# Patient Record
Sex: Male | Born: 1965 | Race: Black or African American | Hispanic: No | Marital: Married | Smoking: Light tobacco smoker
Health system: Southern US, Community
[De-identification: ages and names within clinical notes are randomized; demographics above are authoritative.]

## PROBLEM LIST (undated history)

## (undated) DIAGNOSIS — I639 Cerebral infarction, unspecified: Secondary | ICD-10-CM

## (undated) DIAGNOSIS — S99929A Unspecified injury of unspecified foot, initial encounter: Secondary | ICD-10-CM

## (undated) DIAGNOSIS — E785 Hyperlipidemia, unspecified: Secondary | ICD-10-CM

## (undated) DIAGNOSIS — I10 Essential (primary) hypertension: Secondary | ICD-10-CM

## (undated) HISTORY — DX: Cerebral infarction, unspecified: I63.9

## (undated) HISTORY — DX: Hyperlipidemia, unspecified: E78.5

---
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
## 2000-09-19 ENCOUNTER — Emergency Department (HOSPITAL_COMMUNITY): Admission: EM | Admit: 2000-09-19 | Discharge: 2000-09-19 | Payer: Self-pay | Admitting: Emergency Medicine

## 2000-09-19 ENCOUNTER — Encounter: Payer: Self-pay | Admitting: Emergency Medicine

## 2000-10-03 ENCOUNTER — Emergency Department (HOSPITAL_COMMUNITY): Admission: EM | Admit: 2000-10-03 | Discharge: 2000-10-03 | Payer: Self-pay | Admitting: Emergency Medicine

## 2001-12-25 ENCOUNTER — Emergency Department (HOSPITAL_COMMUNITY): Admission: EM | Admit: 2001-12-25 | Discharge: 2001-12-25 | Payer: Self-pay

## 2006-07-26 ENCOUNTER — Emergency Department (HOSPITAL_COMMUNITY): Admission: EM | Admit: 2006-07-26 | Discharge: 2006-07-26 | Payer: Self-pay | Admitting: Emergency Medicine

## 2007-12-04 ENCOUNTER — Emergency Department (HOSPITAL_COMMUNITY): Admission: EM | Admit: 2007-12-04 | Discharge: 2007-12-04 | Payer: Self-pay | Admitting: Emergency Medicine

## 2009-08-26 ENCOUNTER — Emergency Department (HOSPITAL_COMMUNITY): Admission: EM | Admit: 2009-08-26 | Discharge: 2009-08-26 | Payer: Self-pay | Admitting: Emergency Medicine

## 2009-08-28 ENCOUNTER — Encounter: Admission: RE | Admit: 2009-08-28 | Discharge: 2009-08-28 | Payer: Self-pay | Admitting: Internal Medicine

## 2010-01-19 ENCOUNTER — Emergency Department (HOSPITAL_COMMUNITY): Admission: EM | Admit: 2010-01-19 | Discharge: 2010-01-19 | Payer: Self-pay | Admitting: Emergency Medicine

## 2010-06-08 ENCOUNTER — Emergency Department (HOSPITAL_COMMUNITY)
Admission: EM | Admit: 2010-06-08 | Discharge: 2010-06-08 | Payer: Self-pay | Source: Home / Self Care | Admitting: Emergency Medicine

## 2012-03-14 ENCOUNTER — Encounter (HOSPITAL_COMMUNITY): Payer: Self-pay | Admitting: Emergency Medicine

## 2012-03-14 ENCOUNTER — Emergency Department (INDEPENDENT_AMBULATORY_CARE_PROVIDER_SITE_OTHER)
Admission: EM | Admit: 2012-03-14 | Discharge: 2012-03-14 | Disposition: A | Discharge status: Home / Self Care | Payer: 59 | Source: Home / Self Care | Attending: Family Medicine | Admitting: Family Medicine

## 2012-03-14 DIAGNOSIS — R0789 Other chest pain: Secondary | ICD-10-CM

## 2012-03-14 DIAGNOSIS — I10 Essential (primary) hypertension: Secondary | ICD-10-CM

## 2012-03-14 HISTORY — DX: Essential (primary) hypertension: I10

## 2012-03-14 MED ORDER — LISINOPRIL-HYDROCHLOROTHIAZIDE 20-12.5 MG PO TABS: 1.0000 | ORAL_TABLET | Freq: Every day | ORAL | Status: DC

## 2012-03-14 NOTE — ED Notes (Signed)
Chest pain started last night around 8:00 pm.  Patient was having a stressful conversation.  Touches center of chest as location of pain.  Describes pain as knife like.  Pain is intermittent.  Currently no chest pain.  Denies nausea, no vomiting, denies diaphoresis.  Denies sob.  Patient has been off bp med for 2 months, ran out of medicine

## 2012-03-14 NOTE — ED Provider Notes (Signed)
History     CSN: [id number]  Arrival date & time 03/14/12  1249   First MD Initiated Contact with Patient 03/14/12 1305      Chief Complaint  Patient presents with  . Chest Pain    (Consider location/radiation/quality/duration/timing/severity/associated sxs/prior treatment) Patient is a 46 y.o. male presenting with chest pain. The history is provided by the patient.  Chest Pain The chest pain began yesterday. Chest pain occurs intermittently. The chest pain is resolved. The pain is associated with lifting. The severity of the pain is mild. The quality of the pain is described as sharp. The pain does not radiate. Chest pain is worsened by certain positions. Pertinent negatives for primary symptoms include no fever, no shortness of breath, no nausea and no vomiting.  Pertinent negatives for associated symptoms include no diaphoresis.     Past Medical History  Diagnosis Date  . Hypertension     History reviewed. No pertinent past surgical history.  No family history on file.  History  Substance Use Topics  . Smoking status: Current Every Day Smoker  . Smokeless tobacco: Not on file  . Alcohol Use: Yes      Review of Systems  Constitutional: Negative.  Negative for fever and diaphoresis.  HENT: Negative.   Respiratory: Negative for chest tightness and shortness of breath.   Cardiovascular: Positive for chest pain.  Gastrointestinal: Negative.  Negative for nausea and vomiting.    Allergies  Review of patient's allergies indicates no known allergies.  Home Medications   Current Outpatient Rx  Name Route Sig Dispense Refill  . UNKNOWN TO PATIENT      . LISINOPRIL-HYDROCHLOROTHIAZIDE 20-12.5 MG PO TABS Oral Take 1 tablet by mouth daily. For blood pressure 30 tablet 1    BP 204/122  Pulse 72  Temp 98.6 F (37 C) (Oral)  Resp 20  SpO2 100%  Physical Exam  Nursing note and vitals reviewed. Constitutional: He is oriented to person, place, and time. He appears  well-developed and well-nourished.  HENT:  Head: Normocephalic.  Eyes: Pupils are equal, round, and reactive to light.  Neck: Normal range of motion. Neck supple.  Cardiovascular: Normal rate, regular rhythm, normal heart sounds and intact distal pulses.   Pulmonary/Chest: Breath sounds normal. No respiratory distress. He has no wheezes. He has no rales. He exhibits tenderness.  Abdominal: Bowel sounds are normal.  Neurological: He is alert and oriented to person, place, and time.  Skin: Skin is warm and dry.    ED Course  Procedures (including critical care time)  Labs Reviewed - No data to display No results found.   1. Musculoskeletal chest pain   2. Hypertension       MDM  [id number]--[id number], no acute change.        Linna Hoff, MD 03/14/12 1335

## 2012-11-16 ENCOUNTER — Encounter (HOSPITAL_COMMUNITY): Payer: Self-pay | Admitting: *Deleted

## 2012-11-16 ENCOUNTER — Emergency Department (INDEPENDENT_AMBULATORY_CARE_PROVIDER_SITE_OTHER)
Admission: EM | Admit: 2012-11-16 | Discharge: 2012-11-16 | Disposition: A | Discharge status: Home / Self Care | Payer: Self-pay | Source: Home / Self Care | Attending: Emergency Medicine | Admitting: Emergency Medicine

## 2012-11-16 DIAGNOSIS — M5430 Sciatica, unspecified side: Secondary | ICD-10-CM

## 2012-11-16 DIAGNOSIS — I10 Essential (primary) hypertension: Secondary | ICD-10-CM

## 2012-11-16 DIAGNOSIS — M5431 Sciatica, right side: Secondary | ICD-10-CM

## 2012-11-16 MED ORDER — HYDROCODONE-ACETAMINOPHEN 5-325 MG PO TABS
ORAL_TABLET | ORAL | Status: AC
  Filled 2012-11-16: qty 2

## 2012-11-16 MED ORDER — HYDROCODONE-ACETAMINOPHEN 5-325 MG PO TABS: ORAL_TABLET | ORAL | Status: DC

## 2012-11-16 MED ORDER — METHYLPREDNISOLONE ACETATE 80 MG/ML IJ SUSP
80.0000 mg | Freq: Once | INTRAMUSCULAR | Status: AC
  Administered 2012-11-16: 80 mg via INTRAMUSCULAR

## 2012-11-16 MED ORDER — PREDNISONE 10 MG PO TABS: ORAL_TABLET | ORAL | Status: DC

## 2012-11-16 MED ORDER — AMLODIPINE BESYLATE 10 MG PO TABS: 10.0000 mg | ORAL_TABLET | Freq: Every day | ORAL | Status: DC

## 2012-11-16 MED ORDER — HYDROCODONE-ACETAMINOPHEN 5-325 MG PO TABS
2.0000 | ORAL_TABLET | Freq: Once | ORAL | Status: AC
  Administered 2012-11-16: 2 via ORAL

## 2012-11-16 MED ORDER — LISINOPRIL-HYDROCHLOROTHIAZIDE 20-25 MG PO TABS: 1.0000 | ORAL_TABLET | Freq: Every day | ORAL | Status: DC

## 2012-11-16 MED ORDER — METHOCARBAMOL 500 MG PO TABS: 500.0000 mg | ORAL_TABLET | Freq: Three times a day (TID) | ORAL | Status: DC

## 2012-11-16 MED ORDER — METHYLPREDNISOLONE ACETATE 80 MG/ML IJ SUSP
INTRAMUSCULAR | Status: AC
  Filled 2012-11-16: qty 1

## 2012-11-16 NOTE — ED Provider Notes (Signed)
Chief Complaint:   Chief Complaint  Patient presents with  . Back Pain    History of Present Illness:   Jamarquis Crull is a 47 year old male who has had a three-day history of pain in his right SI area with radiation to the thigh. This is described as a throbbing pain rated 8/10 in intensity. He denies any injury to the area. The pain began while he was walking up some steps. He's had the same thing in the past and been diagnosed with sciatica. The pain is worse if he lies on his right side and better with Advil. The leg feels somewhat weak but there's been no numbness or tingling. He denies any dysuria, frequency, urgency, or blood in the urine. He's had no incontinence of urine or stool. No abdominal pain, fever, chills, headache, stiff neck, or unintentional weight loss.  Review of Systems:  Other than noted above, the patient denies any of the following symptoms: Systemic:  No fever, chills, severe fatigue, or unexplained weight loss. GI:  No abdominal pain, nausea, vomiting, diarrhea, constipation, incontinence of bowel, or blood in stool. GU:  No dysuria, frequency, urgency, or hematuria. No incontinence of urine or difficulty urinating.  M-S:  No neck pain, joint pain, arthritis, or myalgias. Neuro:  No paresthesias, saddle anesthesia, muscular weakness, or progressive neurological deficit.  PMFSH:  Past medical history, family history, social history, meds, and allergies were reviewed. Specifically, there is no history of cancer, major trauma, osteoporosis, immunosuppression, or HIV infection.   Physical Exam:   Vital signs:  BP 170/112  Pulse 72  Temp(Src) 98.6 F (37 C) (Oral)  Resp 18  SpO2 100% General:  Alert, oriented, in no distress. Abdomen:  Soft, non-tender.  No organomegaly or mass.  No pulsatile midline abdominal mass or bruit. Back:  There was tenderness to palpation in the lower lumbar spine and over the right SI area. His back had a fairly good range of motion with pain  at the endpoints of movement. Straight leg raising was positive on the right with a positive Lasegue's sign and negative popliteal compression sign. Neuro:  Normal muscle strength, sensations and DTRs. Extremities: Pedal pulses were full, there was no edema. Skin:  Clear, warm and dry.  No rash.  Course in Urgent Care Center:   Given Depo-Medrol 80 mg IM and Norco 5/325 2 tablets by mouth for pain.  Assessment:  The primary encounter diagnosis was Sciatica, right. A diagnosis of Hypertension was also pertinent to this visit.  Suggested followup with orthopedics if no improvement within the next 2 weeks. He'll also need followup with her primary care Dr. and he was given several names. I increased his blood pressure regimen to lisinopril/HCTZ 20/25 one per day and amlodipine 10 mg per day. He has enough medication to last for 3 months in order to give him enough time to find a primary care Dr.  Roosvelt Harps:   1.  The following meds were prescribed:   Discharge Medication List as of 11/16/2012 11:44 AM    START taking these medications   Details  amLODipine (NORVASC) 10 MG tablet Take 1 tablet (10 mg total) by mouth daily., Starting 11/16/2012, Until Discontinued, Normal    HYDROcodone-acetaminophen (NORCO/VICODIN) 5-325 MG per tablet 1 to 2 tabs every 4 to 6 hours as needed for pain., Print    lisinopril-hydrochlorothiazide (PRINZIDE,ZESTORETIC) 20-25 MG per tablet Take 1 tablet by mouth daily., Starting 11/16/2012, Until Discontinued, Normal    methocarbamol (ROBAXIN) 500 MG tablet Take  1 tablet (500 mg total) by mouth 3 (three) times daily., Starting 11/16/2012, Until Discontinued, Normal    predniSONE (DELTASONE) 10 MG tablet Take 4 tabs daily for 4 days, 3 tabs daily for 4 days, 2 tabs daily for 4 days, then 1 tab daily for 4 days., Normal       2.  The patient was instructed in symptomatic care and handouts were given. 3.  The patient was told to return if becoming worse in any way, if no better  in 2 weeks, and given some red flag symptoms including new or changing neurological symptoms that would indicate earlier return. 4.  The patient was encouraged to try to be as active as possible and given some exercises to do followed by moist heat. 5.  Follow up with Dr. Victorino Dike if back pain does not improve within the next 2 weeks and with a primary care doctor for the blood pressure.    Reuben Likes, MD 11/16/12 [phone]

## 2012-11-16 NOTE — ED Notes (Signed)
Pt  Reports  Pain low  Back  And  r  Hip       X  2  Days      This  Time  He  Reports  A  History  Of  Similar  Symptoms  And  He  States        He  Was  Told         He  May have  A  Pinched  Nerve      - his  bpis  High   He  States  He  Took  His  meds  Today     He   Ambulated  To  Room with a  Slow  Steady  gait

## 2015-02-10 ENCOUNTER — Encounter (HOSPITAL_COMMUNITY): Payer: Self-pay | Admitting: Emergency Medicine

## 2015-02-10 ENCOUNTER — Emergency Department (HOSPITAL_COMMUNITY)
Admission: EM | Admit: 2015-02-10 | Discharge: 2015-02-10 | Disposition: A | Discharge status: Nursing Home (Includes ICF) | Payer: PRIVATE HEALTH INSURANCE | Source: Home / Self Care

## 2015-02-10 ENCOUNTER — Emergency Department (HOSPITAL_COMMUNITY)
Admission: EM | Admit: 2015-02-10 | Discharge: 2015-02-10 | Disposition: A | Discharge status: Home / Self Care | Payer: PRIVATE HEALTH INSURANCE | Attending: Emergency Medicine | Admitting: Emergency Medicine

## 2015-02-10 ENCOUNTER — Other Ambulatory Visit (HOSPITAL_COMMUNITY)
Admission: RE | Admit: 2015-02-10 | Discharge: 2015-02-10 | Disposition: A | Discharge status: Home / Self Care | Payer: PRIVATE HEALTH INSURANCE | Source: Ambulatory Visit | Attending: Family Medicine | Admitting: Family Medicine

## 2015-02-10 DIAGNOSIS — Z202 Contact with and (suspected) exposure to infections with a predominantly sexual mode of transmission: Secondary | ICD-10-CM | POA: Diagnosis not present

## 2015-02-10 DIAGNOSIS — Z720 Tobacco use: Secondary | ICD-10-CM | POA: Diagnosis not present

## 2015-02-10 DIAGNOSIS — Z79899 Other long term (current) drug therapy: Secondary | ICD-10-CM | POA: Insufficient documentation

## 2015-02-10 DIAGNOSIS — I10 Essential (primary) hypertension: Secondary | ICD-10-CM | POA: Insufficient documentation

## 2015-02-10 DIAGNOSIS — I160 Hypertensive urgency: Secondary | ICD-10-CM

## 2015-02-10 DIAGNOSIS — I161 Hypertensive emergency: Secondary | ICD-10-CM

## 2015-02-10 LAB — POCT URINALYSIS DIP (DEVICE)
Bilirubin Urine: NEGATIVE
GLUCOSE, UA: NEGATIVE mg/dL
Ketones, ur: NEGATIVE mg/dL
LEUKOCYTES UA: NEGATIVE
NITRITE: NEGATIVE
PROTEIN: NEGATIVE mg/dL
SPECIFIC GRAVITY, URINE: 1.01 (ref 1.005–1.030)
UROBILINOGEN UA: 1 mg/dL (ref 0.0–1.0)
pH: 6 (ref 5.0–8.0)

## 2015-02-10 LAB — I-STAT CHEM 8, ED
BUN: 9 mg/dL (ref 6–20)
CREATININE: 1.1 mg/dL (ref 0.61–1.24)
Calcium, Ion: 1.1 mmol/L — ABNORMAL LOW (ref 1.12–1.23)
Chloride: 103 mmol/L (ref 101–111)
GLUCOSE: 87 mg/dL (ref 65–99)
HEMATOCRIT: 50 % (ref 39.0–52.0)
HEMOGLOBIN: 17 g/dL (ref 13.0–17.0)
POTASSIUM: 3.7 mmol/L (ref 3.5–5.1)
Sodium: 140 mmol/L (ref 135–145)
TCO2: 22 mmol/L (ref 0–100)

## 2015-02-10 MED ORDER — HYDROCHLOROTHIAZIDE 12.5 MG PO CAPS
12.5000 mg | ORAL_CAPSULE | Freq: Every day | ORAL | Status: DC
  Administered 2015-02-10: 12.5 mg via ORAL
  Filled 2015-02-10: qty 1

## 2015-02-10 MED ORDER — CLONIDINE HCL 0.1 MG PO TABS
ORAL_TABLET | ORAL | Status: AC
  Filled 2015-02-10: qty 2

## 2015-02-10 MED ORDER — HYDROCHLOROTHIAZIDE 12.5 MG PO CAPS: 12.5000 mg | ORAL_CAPSULE | Freq: Every day | ORAL | Status: DC

## 2015-02-10 MED ORDER — LISINOPRIL 20 MG PO TABS
20.0000 mg | ORAL_TABLET | Freq: Once | ORAL | Status: AC
  Administered 2015-02-10: 20 mg via ORAL
  Filled 2015-02-10: qty 1

## 2015-02-10 MED ORDER — CLONIDINE HCL 0.1 MG PO TABS
0.2000 mg | ORAL_TABLET | Freq: Once | ORAL | Status: AC
Start: 2015-02-10 — End: 2015-02-10
  Administered 2015-02-10: 0.2 mg via ORAL

## 2015-02-10 MED ORDER — LISINOPRIL 20 MG PO TABS: 20.0000 mg | ORAL_TABLET | Freq: Once | ORAL | Status: DC

## 2015-02-10 MED ORDER — AMLODIPINE BESYLATE 10 MG PO TABS: 10.0000 mg | ORAL_TABLET | Freq: Every day | ORAL | Status: DC

## 2015-02-10 MED ORDER — AMLODIPINE BESYLATE 5 MG PO TABS
10.0000 mg | ORAL_TABLET | Freq: Every day | ORAL | Status: DC
  Administered 2015-02-10: 10 mg via ORAL
  Filled 2015-02-10: qty 2

## 2015-02-10 MED ORDER — AZITHROMYCIN 250 MG PO TABS
1000.0000 mg | ORAL_TABLET | Freq: Once | ORAL | Status: AC
  Administered 2015-02-10: 1000 mg via ORAL

## 2015-02-10 MED ORDER — AZITHROMYCIN 250 MG PO TABS
ORAL_TABLET | ORAL | Status: AC
  Filled 2015-02-10: qty 4

## 2015-02-10 NOTE — ED Provider Notes (Signed)
CSN: [id number]     Arrival date & time 02/10/15  1913 History   None    Chief Complaint  Patient presents with  . Hip Pain  . Exposure to STD  . Hypertension   (Consider location/radiation/quality/duration/timing/severity/associated sxs/prior Treatment) HPI  Patient is here today complaining about having high blood pressure. He states that he doesn't really check it but knows it is very high. Patient will not donate further details as to how he knows it is high. Last refill on his blood pressure medications was in 2014. Patient states he still has a couple of pills left over from that original prescription which was a 30 day prescription. Patient denies any active headache, chest pain, shortness of breath, palpitations. Patient did have a brother who died of a stroke at age 61. Patient states he took his last blood pressure location a couple days ago was unsure which one it was. Patient with long-standing history of hypertension is uncontrolled. Last vital sign check 2014 shows a blood pressure 170/112. Patient also states that he is sexually active with his wife without condoms to was recently diagnosed with chlamydia. Patient denies any penile discharge, or developing, fevers, nausea, vomiting, back pain.     Past Medical History  Diagnosis Date  . Hypertension    History reviewed. No pertinent past surgical history. Family History  Problem Relation Age of Onset  . Heart attack Mother   . Stroke Brother 52  . Stroke Other    Social History  Substance Use Topics  . Smoking status: Current Every Day Smoker -- 0.50 packs/day    Types: Cigarettes  . Smokeless tobacco: None  . Alcohol Use: Yes    Review of Systems Per HPI with all other pertinent systems negative.   Allergies  Review of patient's allergies indicates no known allergies.  Home Medications   Prior to Admission medications   Medication Sig Start Date End Date Taking? Authorizing Provider  amLODipine (NORVASC)  10 MG tablet Take 1 tablet (10 mg total) by mouth daily. 11/16/12  Yes Reuben Likes, MD  lisinopril-hydrochlorothiazide (PRINZIDE,ZESTORETIC) 20-12.5 MG per tablet Take 1 tablet by mouth daily. For blood pressure 03/14/12  Yes Linna Hoff, MD  lisinopril-hydrochlorothiazide (PRINZIDE,ZESTORETIC) 20-25 MG per tablet Take 1 tablet by mouth daily. 11/16/12   Reuben Likes, MD  methocarbamol (ROBAXIN) 500 MG tablet Take 1 tablet (500 mg total) by mouth 3 (three) times daily. 11/16/12   Reuben Likes, MD  UNKNOWN TO PATIENT     Historical Provider, MD   Meds Ordered and Administered this Visit   Medications  azithromycin (ZITHROMAX) tablet 1,000 mg (not administered)  cloNIDine (CATAPRES) tablet 0.2 mg (not administered)    BP 226/130 mmHg  Pulse 84  Temp(Src) 98.1 F (36.7 C) (Oral)  SpO2 100% No data found.   Physical Exam Physical Exam  Constitutional: oriented to person, place, and time. appears well-developed and well-nourished. No distress.  HENT:  Head: Normocephalic and atraumatic.  Eyes: EOMI. PERRL.  Neck: Normal range of motion.  Cardiovascular: RRR, no m/r/g, 2+ distal pulses,  Pulmonary/Chest: Effort normal and breath sounds normal. No respiratory distress.  Abdominal: Soft. Bowel sounds are normal. NonTTP, no distension.  Musculoskeletal: Normal range of motion. Non ttp, no effusion.  Neurological: alert and oriented to person, place, and time.  Skin: Skin is warm. No rash noted. non diaphoretic.  Psychiatric: normal mood and affect. behavior is normal. Judgment and thought content normal.   ED Course  Procedures (including critical care time)  Labs Review Labs Reviewed  POCT URINALYSIS DIP (DEVICE) - Abnormal; Notable for the following:    Hgb urine dipstick TRACE (*)    All other components within normal limits  HIV ANTIBODY (ROUTINE TESTING)  RPR  URINE CYTOLOGY ANCILLARY ONLY    Imaging Review No results found.   Visual Acuity Review  Right Eye  Distance:   Left Eye Distance:   Bilateral Distance:    Right Eye Near:   Left Eye Near:    Bilateral Near:         MDM   1. Hypertensive emergency   2. STD exposure    Patient presenting in a hypertensive emergency state. Blood pressure on my read with manual cuff was 240/140. Of note patient is here with his wife and is reluctant to fully state all of his symptoms. Patient fact even ask his wife to leave the room. 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 Patient's last blood pressure medication refill was a 30 day supply back in July 2014 at which time his blood pressure was 170/112. Patient has a brother who died of a stroke at age 40. Patient was given clonidine 0.2 mg and transported to the emergency room for further management. Of note patient also complaining of STD exposures his wife had chlamydia.  Azithromycin 1000 g by mouth administered in clinic. STD panel sent. HIV, RPR.    Ozella Rocks, MD 02/10/15 2117

## 2015-02-10 NOTE — ED Notes (Signed)
The patient went to [facility] to be treated for an exposure to an STD.  [facility] gacve him  of Zithromax and 0.2 of clonidine for his BP of 240/140.  [facility] transferred him here for the HTN.

## 2015-02-10 NOTE — Discharge Instructions (Signed)
Hypertension Hypertension, commonly called high blood pressure, is when the force of blood pumping through your arteries is too strong. Your arteries are the blood vessels that carry blood from your heart throughout your body. A blood pressure reading consists of a higher number over a lower number, such as 110/72. The higher number (systolic) is the pressure inside your arteries when your heart pumps. The lower number (diastolic) is the pressure inside your arteries when your heart relaxes. Ideally you want your blood pressure below 120/80. Hypertension forces your heart to work harder to pump blood. Your arteries may become narrow or stiff. Having hypertension puts you at risk for heart disease, stroke, and other problems.  RISK FACTORS Some risk factors for high blood pressure are controllable. Others are not.  Risk factors you cannot control include:   Race. You may be at higher risk if you are African American.  Age. Risk increases with age.  Gender. Men are at higher risk than women before age 14 years. After age 62, women are at higher risk than men. Risk factors you can control include:  Not getting enough exercise or physical activity.  Being overweight.  Getting too much fat, sugar, calories, or salt in your diet.  Drinking too much alcohol. SIGNS AND SYMPTOMS Hypertension does not usually cause signs or symptoms. Extremely high blood pressure (hypertensive crisis) may cause headache, anxiety, shortness of breath, and nosebleed. DIAGNOSIS  To check if you have hypertension, your health care provider will measure your blood pressure while you are seated, with your arm held at the level of your heart. It should be measured at least twice using the same arm. Certain conditions can cause a difference in blood pressure between your right and left arms. A blood pressure reading that is higher than normal on one occasion does not mean that you need treatment. If one blood pressure  reading is high, ask your health care provider about having it checked again. TREATMENT  Treating high blood pressure includes making lifestyle changes and possibly taking medicine. Living a healthy lifestyle can help lower high blood pressure. You may need to change some of your habits. Lifestyle changes may include:  Following the DASH diet. This diet is high in fruits, vegetables, and whole grains. It is low in salt, red meat, and added sugars.  Getting at least 2 hours of brisk physical activity every week.  Losing weight if necessary.  Not smoking.  Limiting alcoholic beverages.  Learning ways to reduce stress. If lifestyle changes are not enough to get your blood pressure under control, your health care provider may prescribe medicine. You may need to take more than one. Work closely with your health care provider to understand the risks and benefits. HOME CARE INSTRUCTIONS  Have your blood pressure rechecked as directed by your health care provider.   Take medicines only as directed by your health care provider. Follow the directions carefully. Blood pressure medicines must be taken as prescribed. The medicine does not work as well when you skip doses. Skipping doses also puts you at risk for problems.   Do not smoke.   Monitor your blood pressure at home as directed by your health care provider. SEEK MEDICAL CARE IF:   You think you are having a reaction to medicines taken.  You have recurrent headaches or feel dizzy.  You have swelling in your ankles.  You have trouble with your vision. SEEK IMMEDIATE MEDICAL CARE IF:  You develop a severe headache or confusion.  You have unusual weakness, numbness, or feel faint.  You have severe chest or abdominal pain.  You vomit repeatedly.  You have trouble breathing. MAKE SURE YOU:   Understand these instructions.  Will watch your condition.  Will get help right away if you are not doing well or get  worse. Document Released: 05/03/2005 Document Revised: 09/17/2013 Document Reviewed: 02/23/2013 [facility] Patient Information 2015 [place], [place]. This information is not intended to replace advice given to you by your health care provider. Make sure you discuss any questions you have with your health care provider.  Managing Your High Blood Pressure Blood pressure is a measurement of how forceful your blood is pressing against the walls of the arteries. Arteries are muscular tubes within the circulatory system. Blood pressure does not stay the same. Blood pressure rises when you are active, excited, or nervous; and it lowers during sleep and relaxation. If the numbers measuring your blood pressure stay above normal most of the time, you are at risk for health problems. High blood pressure (hypertension) is a long-term (chronic) condition in which blood pressure is elevated. A blood pressure reading is recorded as two numbers, such as 120 over 80 (or 120/80). The first, higher number is called the systolic pressure. It is a measure of the pressure in your arteries as the heart beats. The second, lower number is called the diastolic pressure. It is a measure of the pressure in your arteries as the heart relaxes between beats.  Keeping your blood pressure in a normal range is important to your overall health and prevention of health problems, such as heart disease and stroke. When your blood pressure is uncontrolled, your heart has to work harder than normal. High blood pressure is a very common condition in adults because blood pressure tends to rise with age. Men and women are equally likely to have hypertension but at different times in life. Before age 24, men are more likely to have hypertension. After 48 years of age, women are more likely to have it. Hypertension is especially common in African Americans. This condition often has no signs or symptoms. The cause of the condition is usually not known. Your  caregiver can help you come up with a plan to keep your blood pressure in a normal, healthy range. BLOOD PRESSURE STAGES Blood pressure is classified into four stages: normal, prehypertension, stage 1, and stage 2. Your blood pressure reading will be used to determine what type of treatment, if any, is necessary. Appropriate treatment options are tied to these four stages:  Normal  Systolic pressure (mm Hg): below 120.  Diastolic pressure (mm Hg): below 80. Prehypertension  Systolic pressure (mm Hg): 120 to 139.  Diastolic pressure (mm Hg): 80 to 89. Stage1  Systolic pressure (mm Hg): 140 to 159.  Diastolic pressure (mm Hg): 90 to 99. Stage2  Systolic pressure (mm Hg): 160 or above.  Diastolic pressure (mm Hg): 100 or above. RISKS RELATED TO HIGH BLOOD PRESSURE Managing your blood pressure is an important responsibility. Uncontrolled high blood pressure can lead to:  A heart attack.  A stroke.  A weakened blood vessel (aneurysm).  Heart failure.  Kidney damage.  Eye damage.  Metabolic syndrome.  Memory and concentration problems. HOW TO MANAGE YOUR BLOOD PRESSURE Blood pressure can be managed effectively with lifestyle changes and medicines (if needed). Your caregiver will help you come up with a plan to bring your blood pressure within a normal range. Your plan should include the following: Education  Read all information provided by your caregivers about how to control blood pressure.  Educate yourself on the latest guidelines and treatment recommendations. New research is always being done to further define the risks and treatments for high blood pressure. Lifestylechanges  Control your weight.  Avoid smoking.  Stay physically active.  Reduce the amount of salt in your diet.  Reduce stress.  Control any chronic conditions, such as high cholesterol or diabetes.  Reduce your alcohol intake. Medicines  Several medicines (antihypertensive medicines)  are available, if needed, to bring blood pressure within a normal range. Communication  Review all the medicines you take with your caregiver because there may be side effects or interactions.  Talk with your caregiver about your diet, exercise habits, and other lifestyle factors that may be contributing to high blood pressure.  See your caregiver regularly. Your caregiver can help you create and adjust your plan for managing high blood pressure. RECOMMENDATIONS FOR TREATMENT AND FOLLOW-UP  The following recommendations are based on current guidelines for managing high blood pressure in nonpregnant adults. Use these recommendations to identify the proper follow-up period or treatment option based on your blood pressure reading. You can discuss these options with your caregiver.  Systolic pressure of 120 to 139 or diastolic pressure of 80 to 89: Follow up with your caregiver as directed.  Systolic pressure of 140 to 160 or diastolic pressure of 90 to 100: Follow up with your caregiver within 2 months.  Systolic pressure above 160 or diastolic pressure above 100: Follow up with your caregiver within 1 month.  Systolic pressure above 180 or diastolic pressure above 110: Consider antihypertensive therapy; follow up with your caregiver within 1 week.  Systolic pressure above 200 or diastolic pressure above 120: Begin antihypertensive therapy; follow up with your caregiver within 1 week. Document Released: 01/26/2012 Document Reviewed: 01/26/2012 [facility] Patient Information 2015 [place], [place]. This information is not intended to replace advice given to you by your health care provider. Make sure you discuss any questions you have with your health care provider.     Emergency Department Resource Guide 1) Find a Doctor and Pay Out of Pocket Although you won't have to find out who is covered by your insurance plan, it is a good idea to ask around and get recommendations. You will then need to  call the office and see if the doctor you have chosen will accept you as a new patient and what types of options they offer for patients who are self-pay. Some doctors offer discounts or will set up payment plans for their patients who do not have insurance, but you will need to ask so you aren't surprised when you get to your appointment.  2) Contact Your Local Health Department Not all health departments have doctors that can see patients for sick visits, but many do, so it is worth a call to see if yours does. If you don't know where your local health department is, you can check in your phone book. The CDC also has a tool to help you locate your state's health department, and many state websites also have listings of all of their local health departments.  3) Find a Walk-in Clinic If your illness is not likely to be very severe or complicated, you may want to try a walk in clinic. These are popping up all over the country in pharmacies, drugstores, and shopping centers. They're usually staffed by nurse practitioners or physician assistants that have been trained to treat common  illnesses and complaints. They're usually fairly quick and inexpensive. However, if you have serious medical issues or chronic medical problems, these are probably not your best option.  No Primary Care Doctor: - Call Health Connect at  [phone] - they can help you locate a primary care doctor that  accepts your insurance, provides certain services, etc. - Physician Referral Service- [phone]  Chronic Pain Problems: Organization         Address  Phone   Notes  [facility]  [phone] Patients need to be referred by their primary care doctor.   Medication Assistance: Organization         Address  Phone   Notes  [facility] [address] [place] [place], [place] [address] [phone] --Must be a resident of [facility] -- Must have NO insurance  coverage whatsoever (no Medicaid/ Medicare, etc.) -- The pt. MUST have a primary care doctor that directs their care regularly and follows them in the community   [facility]  [phone]   [employer]  [phone]    Agencies that provide inexpensive medical care: Organization         Address  Phone   Notes  [facility]  [phone]   [facility]    [phone]   [facility] [address] [place], [place] [address] [phone]   [facility] [address] [place] [phone]   [facility]    [phone]   [facility]    [phone]   [facility]  [address], [place] Phone:  [phone], Fax:  [phone] Hours of Operation:  9 am - 6 pm, M-F.  Also accepts Medicaid/Medicare and self-pay.  [facility]  [address], [place] Phone: [phone], Fax: [phone]. Hours of Operation:  8:30 am - 5:30 pm, M-F.  Also accepts Medicaid and self-pay.  [facility] [place] [address], [place] Phone: [phone]   [facility] [address] Natasha Bence [place], [place] [phone], Ext. [phone] Mondays & Thursdays: 7-9 AM.  First 15 patients are seen on a first come, first serve basis.    Medicaid-accepting [facility] Providers:  Organization         Address  Phone   Notes  [facility] [address], [place] [phone] Also accepts self-pay patients.  [facility] [address] Laurell Josephs [place], [place]  [phone]   [facility] [address], [place] [phone]   [facility] [address], [place] [phone]   Renaye Rakers [address], [place]   [phone] Only accepts [place] patients after they have their  name applied to their card.   Self-Pay (no insurance) in [facility]:  Organization         Address  Phone   Notes  Sickle Cell Patients, [facility] [address] [place], [place] [phone]   [facility] [address] [place], [place] [phone]   [facility] [place]  [address], [place] [phone]   [facility]/Dr. Osei-Bonsu  [address], [place] or [address], [address],  [place] [phone] Phone number for both [facility] and [place] locations is the same.  [facility] [address], [place] [phone]   [facility] [address] [place] or [address] [phone]   [facility] [address] [place] [phone], phone; [phone], fax Sees patients 1st and 3rd Saturday of every month.  Must not qualify for public or private insurance (i.e. Medicaid, Medicare, [place] Health Choice, Veterans' Benefits)  Household income should be no more than 200% of the poverty level The clinic cannot treat you if you are pregnant or think you are pregnant  Sexually transmitted diseases are not treated at the clinic.

## 2015-02-10 NOTE — ED Notes (Signed)
MD at bedside. 

## 2015-02-10 NOTE — ED Notes (Signed)
Pt is here today for chronic issue with his right hip.  Pt has been seen here and by other doctors for his hip.  He has not followed up with an orthopedist.  He is also here for STD check.  His wife was diagnosed with chlamydia three weeks ago.  While being assessed the pt is noted to have extremely high BP.  Dr. Konrad Dolores is aware.

## 2015-02-10 NOTE — ED Notes (Signed)
Pt is being transferred to the ED via shuttle for elevated BP.  Report was given to [facility], RN in the ED.

## 2015-02-10 NOTE — ED Provider Notes (Signed)
CSN: [id number]     Arrival date & time 02/10/15  2126 History   First MD Initiated Contact with Patient 02/10/15 2221     Chief Complaint  Patient presents with  . Hypertension    The patient went to [facility] to be treated for an exposure to an STD.  [facility] gacve him  of Zithromax and 0.2 of clonidine for his BP of 240/140.  [facility] transferred him here for the HTN.   Patient is a 49 y.o. male presenting with hypertension.  Hypertension This is a recurrent problem. The current episode started today. The problem has been unchanged. Pertinent negatives include no abdominal pain, anorexia, arthralgias, change in bowel habit, chest pain, chills, congestion, diaphoresis, fatigue, fever, headaches, joint swelling, myalgias, nausea, neck pain, numbness, rash, sore throat, swollen glands, urinary symptoms, vertigo, visual change, vomiting or weakness. Nothing aggravates the symptoms.    Past Medical History  Diagnosis Date  . Hypertension    History reviewed. No pertinent past surgical history. Family History  Problem Relation Age of Onset  . Heart attack Mother   . Stroke Brother 62  . Stroke Other    Social History  Substance Use Topics  . Smoking status: Current Every Day Smoker -- 0.50 packs/day    Types: Cigarettes  . Smokeless tobacco: None  . Alcohol Use: Yes    Review of Systems  Constitutional: Negative for fever, chills, diaphoresis and fatigue.  HENT: Negative for congestion and sore throat.   Cardiovascular: Negative for chest pain.  Gastrointestinal: Negative for nausea, vomiting, abdominal pain, anorexia and change in bowel habit.  Genitourinary: Negative for dysuria, hematuria and difficulty urinating.  Musculoskeletal: Negative for myalgias, joint swelling, arthralgias and neck pain.  Skin: Negative for rash.  Neurological: Negative for vertigo, weakness, numbness and headaches.  Psychiatric/Behavioral: Negative for confusion and agitation.  All other systems reviewed and  are negative.  Allergies  Review of patient's allergies indicates no known allergies.  Home Medications   Prior to Admission medications   Medication Sig Start Date End Date Taking? Authorizing Provider  amLODipine (NORVASC) 10 MG tablet Take 1 tablet (10 mg total) by mouth daily. 11/16/12   Reuben Likes, MD  lisinopril-hydrochlorothiazide (PRINZIDE,ZESTORETIC) 20-12.5 MG per tablet Take 1 tablet by mouth daily. For blood pressure 03/14/12   Linna Hoff, MD  lisinopril-hydrochlorothiazide (PRINZIDE,ZESTORETIC) 20-25 MG per tablet Take 1 tablet by mouth daily. 11/16/12   Reuben Likes, MD  methocarbamol (ROBAXIN) 500 MG tablet Take 1 tablet (500 mg total) by mouth 3 (three) times daily. 11/16/12   Reuben Likes, MD  UNKNOWN TO PATIENT     Historical Provider, MD   BP 231/134 mmHg  Pulse 87  Temp(Src) 97.6 F (36.4 C) (Oral)  Resp 16  Ht  (1.676 m)  Wt 173 lb (78.472 kg)  BMI 27.94 kg/m2  SpO2 97% Physical Exam  Constitutional: He is oriented to person, place, and time. He appears well-developed and well-nourished. No distress.  HENT:  Head: Normocephalic and atraumatic.  Eyes: Conjunctivae and EOM are normal. Pupils are equal, round, and reactive to light.  Neck: Normal range of motion. Neck supple.  Cardiovascular: Normal rate.   No murmur heard. Pulmonary/Chest: Effort normal. No respiratory distress. He has no wheezes.  Abdominal: Soft. He exhibits no distension. There is no tenderness.  Neurological: He is alert and oriented to person, place, and time. No cranial nerve deficit. He exhibits normal muscle tone. Coordination normal.  Normal finger to nose  Skin: Skin is warm and dry. No rash noted. He is not diaphoretic. No erythema.  Nursing note and vitals reviewed.  ED Course  Procedures (including critical care time) Labs Review Labs Reviewed  I-STAT CHEM 8, ED - Abnormal; Notable for the following:    Calcium, Ion 1.10 (*)    All other components within normal  limits    MDM   Mr. Havey presents to the ED today from urgent care for hypertension. Patient denies any signs of end organ damage. He has no complaints and states his BP was found to be hypertensive at Urgent Care when he was having an STD treatment for Chlamydia. Patient given clonidine and sent here for continued hypertension.   Here patient has no signs of end organ damage. No labs needed at this time. He will be started on his home medications (last filled in 2014) and will have follow up with a PCP. Strict return precautions given. Patient in agreement with plan and discharged home in good health.   Final diagnoses:  Essential hypertension  Hypertensive urgency       Deirdre Peer, MD 02/10/15 4540  Lavera Guise, MD 02/11/15 [phone]

## 2015-02-11 ENCOUNTER — Encounter (HOSPITAL_COMMUNITY): Payer: Self-pay | Admitting: Emergency Medicine

## 2015-02-11 ENCOUNTER — Emergency Department (HOSPITAL_COMMUNITY)
Admission: EM | Admit: 2015-02-11 | Discharge: 2015-02-11 | Disposition: A | Discharge status: Home / Self Care | Payer: PRIVATE HEALTH INSURANCE | Attending: Emergency Medicine | Admitting: Emergency Medicine

## 2015-02-11 DIAGNOSIS — Z720 Tobacco use: Secondary | ICD-10-CM | POA: Insufficient documentation

## 2015-02-11 DIAGNOSIS — Y9289 Other specified places as the place of occurrence of the external cause: Secondary | ICD-10-CM | POA: Diagnosis not present

## 2015-02-11 DIAGNOSIS — Y998 Other external cause status: Secondary | ICD-10-CM | POA: Diagnosis not present

## 2015-02-11 DIAGNOSIS — T7840XA Allergy, unspecified, initial encounter: Secondary | ICD-10-CM

## 2015-02-11 DIAGNOSIS — X58XXXA Exposure to other specified factors, initial encounter: Secondary | ICD-10-CM | POA: Insufficient documentation

## 2015-02-11 DIAGNOSIS — Y9389 Activity, other specified: Secondary | ICD-10-CM | POA: Diagnosis not present

## 2015-02-11 DIAGNOSIS — I10 Essential (primary) hypertension: Secondary | ICD-10-CM | POA: Insufficient documentation

## 2015-02-11 DIAGNOSIS — T783XXA Angioneurotic edema, initial encounter: Secondary | ICD-10-CM | POA: Diagnosis not present

## 2015-02-11 LAB — URINE CYTOLOGY ANCILLARY ONLY
Chlamydia: NEGATIVE
Neisseria Gonorrhea: NEGATIVE
TRICH (WINDOWPATH): NEGATIVE

## 2015-02-11 LAB — HIV ANTIBODY (ROUTINE TESTING W REFLEX): HIV SCREEN 4TH GENERATION: NONREACTIVE

## 2015-02-11 MED ORDER — EPINEPHRINE 0.3 MG/0.3ML IJ SOAJ
0.3000 mg | Freq: Once | INTRAMUSCULAR | Status: AC
  Administered 2015-02-11: 0.3 mg via INTRAMUSCULAR

## 2015-02-11 MED ORDER — EPINEPHRINE 0.3 MG/0.3ML IJ SOAJ: 0.3000 mg | Freq: Once | INTRAMUSCULAR | Status: DC

## 2015-02-11 MED ORDER — EPINEPHRINE 0.3 MG/0.3ML IJ SOAJ
INTRAMUSCULAR | Status: AC
  Administered 2015-02-11: 0.3 mg via INTRAMUSCULAR
  Filled 2015-02-11: qty 0.3

## 2015-02-11 MED ORDER — SODIUM CHLORIDE 0.9 % IV SOLN
Freq: Once | INTRAVENOUS | Status: AC
  Administered 2015-02-11: 10 mL/h via INTRAVENOUS

## 2015-02-11 MED ORDER — DIPHENHYDRAMINE HCL 50 MG/ML IJ SOLN
25.0000 mg | Freq: Once | INTRAMUSCULAR | Status: AC
  Administered 2015-02-11: 25 mg via INTRAVENOUS
  Filled 2015-02-11: qty 1

## 2015-02-11 MED ORDER — FAMOTIDINE IN NACL 20-0.9 MG/50ML-% IV SOLN
20.0000 mg | Freq: Once | INTRAVENOUS | Status: AC
  Administered 2015-02-11: 20 mg via INTRAVENOUS
  Filled 2015-02-11: qty 50

## 2015-02-11 MED ORDER — METHYLPREDNISOLONE SODIUM SUCC 125 MG IJ SOLR
125.0000 mg | Freq: Once | INTRAMUSCULAR | Status: AC
  Administered 2015-02-11: 125 mg via INTRAVENOUS
  Filled 2015-02-11: qty 2

## 2015-02-11 MED ORDER — SODIUM CHLORIDE 0.9 % IV BOLUS (SEPSIS)
1000.0000 mL | Freq: Once | INTRAVENOUS | Status: AC
  Administered 2015-02-11: 1000 mL via INTRAVENOUS

## 2015-02-11 NOTE — ED Provider Notes (Signed)
CSN: [id number]     Arrival date & time 02/11/15  1344 History   First MD Initiated Contact with Patient 02/11/15 1404     Chief Complaint  Patient presents with  . Allergic Reaction    Patient is a 49 y.o. male presenting with allergic reaction. The history is provided by the patient, the spouse and medical records.  Allergic Reaction Presenting symptoms: difficulty breathing, difficulty swallowing and swelling   Presenting symptoms: no rash   Presenting symptoms comment:  Incontinence of stool, vomiting Severity:  Severe Prior allergic episodes:  No prior episodes Context: medications (was seen yesterday for uncontrolled HTN to 230/130s, restarted all three home meds at once including lisinopril )   Relieved by:  None tried Worsened by:  Nothing tried Ineffective treatments:  Rest   Past Medical History  Diagnosis Date  . Hypertension    History reviewed. No pertinent past surgical history. Family History  Problem Relation Age of Onset  . Heart attack Mother   . Stroke Brother 58  . Stroke Other    Social History  Substance Use Topics  . Smoking status: Current Every Day Smoker -- 0.50 packs/day    Types: Cigarettes  . Smokeless tobacco: None  . Alcohol Use: Yes    Review of Systems  Constitutional: Negative for fever.  HENT: Positive for facial swelling and trouble swallowing.   Eyes: Negative for visual disturbance.  Respiratory: Positive for shortness of breath.   Cardiovascular: Negative for chest pain.  Gastrointestinal: Positive for nausea and vomiting. Negative for abdominal pain.  Genitourinary: Negative for decreased urine volume.  Skin: Negative for rash.  Allergic/Immunologic: Negative for immunocompromised state.  Neurological: Negative for syncope.  Psychiatric/Behavioral: Negative for confusion.    Allergies  Review of patient's allergies indicates no known allergies.  Home Medications   Prior to Admission medications   Medication Sig Start  Date End Date Taking? Authorizing Provider  amLODipine (NORVASC) 10 MG tablet Take 1 tablet (10 mg total) by mouth daily. 02/11/15  Yes Deirdre Peer, MD  hydrochlorothiazide (MICROZIDE) 12.5 MG capsule Take 1 capsule (12.5 mg total) by mouth daily. 02/11/15  Yes Deirdre Peer, MD  lisinopril (PRINIVIL,ZESTRIL) 20 MG tablet Take 1 tablet (20 mg total) by mouth once. Patient taking differently: Take 20 mg by mouth daily.  02/10/15  Yes Deirdre Peer, MD  EPINEPHrine 0.3 mg/0.3 mL IJ SOAJ injection Inject 0.3 mLs (0.3 mg total) into the muscle once. 02/11/15   Urban Gibson, MD   BP 122/92 mmHg  Pulse 59  Resp 18  SpO2 100% Physical Exam  Constitutional: He is oriented to person, place, and time. He appears well-developed and well-nourished. He appears distressed.  HENT:  Head: Normocephalic.  Diffuse edema of upper lip, able to visualize posterior oropharynx with no appreciable posterior edema or tongue swelling   Eyes: Right eye exhibits no discharge. Left eye exhibits no discharge.  Neck: No tracheal deviation present.  Cardiovascular: Normal rate and regular rhythm.   Pulmonary/Chest: No stridor. He is in respiratory distress. He has no rales.  Abdominal: Soft. He exhibits no distension. There is no tenderness.  Musculoskeletal: He exhibits no edema.  Neurological: He is alert and oriented to person, place, and time.  Skin: Skin is warm and dry.  Psychiatric: He has a normal mood and affect. His behavior is normal.    ED Course  Procedures (including critical care time) Labs Review Labs Reviewed - No data to display  Imaging Review No results found. I  have personally reviewed and evaluated these images and lab results as part of my medical decision-making.   EKG Interpretation None      MDM   Final diagnoses:  Angioedema of lips, initial encounter  Allergic reaction, initial encounter   49 year old male with history of hypertension presenting with acute anaphylaxis with  angioedema in setting of restarting 3 home medications for blood pressure management, including lisinopril.  Most likely related to ACE inhibitor use. Given IM epinephrine x1, IV fluids and antihistamines with improvement. Monitored for several hours with no recurrence of symptoms.  Discharged in stable condition with strict return precautions, PCP follow-up, and prescription for EpiPen with instructions on use.  Case discussed with Dr. Silverio Lay, who oversaw management of this patient.     Urban Gibson, MD 02/11/15 1721  Richardean Canal, MD 02/13/15 1608  Richardean Canal, MD 02/13/15 [phone]

## 2015-02-11 NOTE — ED Notes (Signed)
Pt arrives via POV from home with upper airway swelling, difficulty speaking in full sentences. Vomiting and incontinent of stool in triage. Pt taken back to resus room via wheelchair. Unable to obtain vitals due to patient status.

## 2015-02-11 NOTE — ED Notes (Signed)
Final report of GC, chlamydia, trich negative. No further action required

## 2015-02-11 NOTE — Discharge Instructions (Signed)
Angioedema °Angioedema is sudden puffiness (swelling), often of the skin. It can happen: °· On your face or privates (genitals). °· In your belly (abdomen) or other body parts. °It usually happens quickly and gets better in 1 or 2 days. It often starts at night and is found when you wake up. You may get red, itchy patches of skin (hives). Attacks can be dangerous if your breathing passages get puffy. °The condition may happen only once, or it can come back at random times. It may happen for several years before it goes away for good. °HOME CARE °· Only take medicines as told by your doctor. °· Always carry your emergency allergy medicines with you. °· Wear a medical bracelet as told by your doctor. °· Avoid things that you know will cause attacks (triggers). °GET HELP IF: °· You have another attack. °· Your attacks happen more often or get worse. °· The condition was passed to you by your parents and you want to have children. °GET HELP RIGHT AWAY IF:  °· Your mouth, tongue, or lips are very puffy. °· You have trouble breathing. °· You have trouble swallowing. °· You pass out (faint). °MAKE SURE YOU:  °· Understand these instructions. °· Will watch your condition. °· Will get help right away if you are not doing well or get worse. °Document Released: 04/21/2009 Document Revised: 02/21/2013 Document Reviewed: 12/25/2012 °[employer]® Patient Information ©2015 [employer]. This information is not intended to replace advice given to you by your health care provider. Make sure you discuss any questions you have with your health care provider. ° °

## 2015-02-12 ENCOUNTER — Telehealth (HOSPITAL_BASED_OUTPATIENT_CLINIC_OR_DEPARTMENT_OTHER): Payer: Self-pay | Admitting: Emergency Medicine

## 2015-02-12 LAB — RPR, QUANT+TP ABS (REFLEX)
Rapid Plasma Reagin, Quant: 1:2 {titer} — ABNORMAL HIGH
TREPONEMA PALLIDUM AB: POSITIVE — AB

## 2015-02-12 LAB — RPR: RPR Ser Ql: REACTIVE — AB

## 2015-02-13 NOTE — ED Notes (Addendum)
RPR report positive. Discussed w Dr Linna Hoff, who instructs we are to inform patient of report, and have them follow up at the [facility]. Called patient to discuss, and after verifying ID, discussed positive findings

## 2015-05-18 DIAGNOSIS — I639 Cerebral infarction, unspecified: Secondary | ICD-10-CM

## 2015-05-18 HISTORY — DX: Cerebral infarction, unspecified: I63.9

## 2015-11-28 ENCOUNTER — Emergency Department (HOSPITAL_COMMUNITY): Payer: 59

## 2015-11-28 ENCOUNTER — Inpatient Hospital Stay (HOSPITAL_COMMUNITY): Payer: 59

## 2015-11-28 ENCOUNTER — Encounter (HOSPITAL_COMMUNITY): Payer: Self-pay | Admitting: Emergency Medicine

## 2015-11-28 ENCOUNTER — Inpatient Hospital Stay (HOSPITAL_COMMUNITY)
Admission: EM | Admit: 2015-11-28 | Discharge: 2015-11-30 | DRG: 066 | Disposition: A | Discharge status: Home / Self Care | Payer: 59 | Attending: Internal Medicine | Admitting: Internal Medicine

## 2015-11-28 DIAGNOSIS — F17210 Nicotine dependence, cigarettes, uncomplicated: Secondary | ICD-10-CM | POA: Diagnosis present

## 2015-11-28 DIAGNOSIS — I159 Secondary hypertension, unspecified: Secondary | ICD-10-CM | POA: Diagnosis not present

## 2015-11-28 DIAGNOSIS — I10 Essential (primary) hypertension: Secondary | ICD-10-CM | POA: Diagnosis present

## 2015-11-28 DIAGNOSIS — F1020 Alcohol dependence, uncomplicated: Secondary | ICD-10-CM

## 2015-11-28 DIAGNOSIS — I6789 Other cerebrovascular disease: Secondary | ICD-10-CM | POA: Diagnosis not present

## 2015-11-28 DIAGNOSIS — I638 Other cerebral infarction: Secondary | ICD-10-CM | POA: Diagnosis not present

## 2015-11-28 DIAGNOSIS — Z9119 Patient's noncompliance with other medical treatment and regimen: Secondary | ICD-10-CM

## 2015-11-28 DIAGNOSIS — Z823 Family history of stroke: Secondary | ICD-10-CM | POA: Diagnosis not present

## 2015-11-28 DIAGNOSIS — Z79899 Other long term (current) drug therapy: Secondary | ICD-10-CM | POA: Diagnosis not present

## 2015-11-28 DIAGNOSIS — I160 Hypertensive urgency: Secondary | ICD-10-CM | POA: Diagnosis present

## 2015-11-28 DIAGNOSIS — E785 Hyperlipidemia, unspecified: Secondary | ICD-10-CM | POA: Diagnosis present

## 2015-11-28 DIAGNOSIS — I639 Cerebral infarction, unspecified: Secondary | ICD-10-CM | POA: Diagnosis present

## 2015-11-28 DIAGNOSIS — Z8249 Family history of ischemic heart disease and other diseases of the circulatory system: Secondary | ICD-10-CM | POA: Diagnosis not present

## 2015-11-28 DIAGNOSIS — I6381 Other cerebral infarction due to occlusion or stenosis of small artery: Secondary | ICD-10-CM | POA: Diagnosis present

## 2015-11-28 LAB — CBC WITH DIFFERENTIAL/PLATELET
BASOS ABS: 0 10*3/uL (ref 0.0–0.1)
BASOS PCT: 0 %
EOS ABS: 0.2 10*3/uL (ref 0.0–0.7)
EOS PCT: 4 %
HEMATOCRIT: 44 % (ref 39.0–52.0)
Hemoglobin: 15.2 g/dL (ref 13.0–17.0)
Lymphocytes Relative: 24 %
Lymphs Abs: 1.3 10*3/uL (ref 0.7–4.0)
MCH: 30.8 pg (ref 26.0–34.0)
MCHC: 34.5 g/dL (ref 30.0–36.0)
MCV: 89.1 fL (ref 78.0–100.0)
MONO ABS: 0.4 10*3/uL (ref 0.1–1.0)
Monocytes Relative: 7 %
NEUTROS ABS: 3.5 10*3/uL (ref 1.7–7.7)
Neutrophils Relative %: 65 %
PLATELETS: 284 10*3/uL (ref 150–400)
RBC: 4.94 MIL/uL (ref 4.22–5.81)
RDW: 13 % (ref 11.5–15.5)
WBC: 5.4 10*3/uL (ref 4.0–10.5)

## 2015-11-28 LAB — BASIC METABOLIC PANEL
ANION GAP: 8 (ref 5–15)
BUN: 13 mg/dL (ref 6–20)
CALCIUM: 9 mg/dL (ref 8.9–10.3)
CO2: 22 mmol/L (ref 22–32)
Chloride: 108 mmol/L (ref 101–111)
Creatinine, Ser: 1.09 mg/dL (ref 0.61–1.24)
Glucose, Bld: 95 mg/dL (ref 65–99)
Potassium: 4.3 mmol/L (ref 3.5–5.1)
Sodium: 138 mmol/L (ref 135–145)

## 2015-11-28 MED ORDER — ASPIRIN EC 81 MG PO TBEC
81.0000 mg | DELAYED_RELEASE_TABLET | Freq: Every day | ORAL | Status: DC
  Administered 2015-11-28 – 2015-11-30 (×3): 81 mg via ORAL
  Filled 2015-11-28 (×3): qty 1

## 2015-11-28 MED ORDER — ACETAMINOPHEN 650 MG RE SUPP: 650.0000 mg | RECTAL | Status: DC | PRN

## 2015-11-28 MED ORDER — HYDRALAZINE HCL 20 MG/ML IJ SOLN
5.0000 mg | Freq: Once | INTRAMUSCULAR | Status: AC
  Administered 2015-11-28: 5 mg via INTRAVENOUS
  Filled 2015-11-28: qty 1

## 2015-11-28 MED ORDER — PHENOL 1.4 % MT LIQD
1.0000 | OROMUCOSAL | Status: DC | PRN
  Administered 2015-11-28: 1 via OROMUCOSAL
  Filled 2015-11-28: qty 177

## 2015-11-28 MED ORDER — CLONIDINE HCL 0.2 MG PO TABS
0.2000 mg | ORAL_TABLET | Freq: Once | ORAL | Status: AC
Start: 2015-11-28 — End: 2015-11-28
  Administered 2015-11-28: 0.2 mg via ORAL
  Filled 2015-11-28: qty 1

## 2015-11-28 MED ORDER — SENNOSIDES-DOCUSATE SODIUM 8.6-50 MG PO TABS: 1.0000 | ORAL_TABLET | Freq: Every evening | ORAL | Status: DC | PRN

## 2015-11-28 MED ORDER — STROKE: EARLY STAGES OF RECOVERY BOOK
Freq: Once | Status: AC
  Administered 2015-11-28: 14:00:00
  Filled 2015-11-28: qty 1

## 2015-11-28 MED ORDER — FOLIC ACID 1 MG PO TABS
1.0000 mg | ORAL_TABLET | Freq: Every day | ORAL | Status: DC
  Administered 2015-11-28 – 2015-11-30 (×3): 1 mg via ORAL
  Filled 2015-11-28 (×3): qty 1

## 2015-11-28 MED ORDER — ACETAMINOPHEN 325 MG PO TABS: 650.0000 mg | ORAL_TABLET | ORAL | Status: DC | PRN

## 2015-11-28 MED ORDER — VITAMIN B-1 100 MG PO TABS
100.0000 mg | ORAL_TABLET | Freq: Every day | ORAL | Status: DC
  Administered 2015-11-28 – 2015-11-30 (×3): 100 mg via ORAL
  Filled 2015-11-28 (×5): qty 1

## 2015-11-28 MED ORDER — ADULT MULTIVITAMIN W/MINERALS CH
1.0000 | ORAL_TABLET | Freq: Every day | ORAL | Status: DC
  Administered 2015-11-28 – 2015-11-30 (×3): 1 via ORAL
  Filled 2015-11-28 (×3): qty 1

## 2015-11-28 MED ORDER — CLONIDINE HCL 0.2 MG PO TABS
0.2000 mg | ORAL_TABLET | Freq: Once | ORAL | Status: AC
  Administered 2015-11-28: 0.2 mg via ORAL
  Filled 2015-11-28: qty 1

## 2015-11-28 MED ORDER — THIAMINE HCL 100 MG/ML IJ SOLN: 100.0000 mg | Freq: Every day | INTRAMUSCULAR | Status: DC

## 2015-11-28 MED ORDER — LISINOPRIL 20 MG PO TABS
20.0000 mg | ORAL_TABLET | Freq: Once | ORAL | Status: AC
  Administered 2015-11-28: 20 mg via ORAL
  Filled 2015-11-28: qty 1

## 2015-11-28 MED ORDER — ATORVASTATIN CALCIUM 80 MG PO TABS
80.0000 mg | ORAL_TABLET | Freq: Every day | ORAL | Status: DC
  Administered 2015-11-28 – 2015-11-30 (×3): 80 mg via ORAL
  Filled 2015-11-28 (×3): qty 1

## 2015-11-28 MED ORDER — ENOXAPARIN SODIUM 40 MG/0.4ML ~~LOC~~ SOLN
40.0000 mg | SUBCUTANEOUS | Status: DC
  Administered 2015-11-28 – 2015-11-29 (×2): 40 mg via SUBCUTANEOUS
  Filled 2015-11-28 (×2): qty 0.4

## 2015-11-28 NOTE — ED Notes (Signed)
Pt from home with c/o dizziness and left leg weakness on standing starting yesterday afternoon.  Denies left arm weakness, no facial droop, no slurred speech.  Pt in NAD, A&O.

## 2015-11-28 NOTE — ED Provider Notes (Signed)
CSN: [id number]     Arrival date & time 11/28/15  0908 History   First MD Initiated Contact with Patient 11/28/15 [phone]     Chief Complaint  Patient presents with  . Dizziness  . Weakness      HPI  Expand All Collapse All   Pt from home with c/o dizziness and left leg weakness on standing starting yesterday afternoon. Denies left arm weakness, no facial droop, no slurred speech. Pt in NAD, A&O        Past Medical History  Diagnosis Date  . Hypertension    History reviewed. No pertinent past surgical history. Family History  Problem Relation Age of Onset  . Heart attack Mother   . Stroke Brother 43  . Stroke Other    Social History  Substance Use Topics  . Smoking status: Current Every Day Smoker -- 0.50 packs/day    Types: Cigarettes  . Smokeless tobacco: None  . Alcohol Use: Yes    Review of Systems  All other systems reviewed and are negative  Allergies  Review of patient's allergies indicates no known allergies.  Home Medications   Prior to Admission medications   Medication Sig Start Date End Date Taking? Authorizing Provider  acetaminophen (TYLENOL) 500 MG tablet Take 500 mg by mouth every 6 (six) hours as needed (pain).   Yes Historical Provider, MD  amLODipine (NORVASC) 10 MG tablet Take 1 tablet (10 mg total) by mouth daily. 02/11/15  Yes Deirdre Peer, MD  hydrochlorothiazide (MICROZIDE) 12.5 MG capsule Take 1 capsule (12.5 mg total) by mouth daily. 02/11/15  Yes Deirdre Peer, MD  lisinopril (PRINIVIL,ZESTRIL) 20 MG tablet Take 1 tablet (20 mg total) by mouth once. Patient taking differently: Take 20 mg by mouth daily.  02/10/15  Yes Deirdre Peer, MD  EPINEPHrine 0.3 mg/0.3 mL IJ SOAJ injection Inject 0.3 mLs (0.3 mg total) into the muscle once. 02/11/15   Urban Gibson, MD   BP 198/111 mmHg  Pulse 47  Temp(Src) 97.4 F (36.3 C) (Oral)  Resp 14  SpO2 100% Physical Exam Physical Exam  Nursing note and vitals reviewed. Constitutional: He is  oriented to person, place, and time. He appears well-developed and well-nourished. No distress.  HENT:  Head: Normocephalic and atraumatic.  Eyes: Pupils are equal, round, and reactive to light.  Neck: Normal range of motion.  Cardiovascular: Normal rate and intact distal pulses.   Pulmonary/Chest: No respiratory distress.  Abdominal: Normal appearance. He exhibits no distension.  Musculoskeletal: Normal range of motion.  Neurological: He is alert and oriented to person, place, and time. No cranial nerve deficit.  patient has left lower leg weakness to exam.  Good pulses.  Upper extremities appear normal. Skin: Skin is warm and dry. No rash noted.  Psychiatric: He has a normal mood and affect. His behavior is normal.   ED Course  Procedures (including critical care time) Medications  hydrALAZINE (APRESOLINE) injection 5 mg (not administered)  cloNIDine (CATAPRES) tablet 0.2 mg (0.2 mg Oral Given 11/28/15 1007)  cloNIDine (CATAPRES) tablet 0.2 mg (0.2 mg Oral Given 11/28/15 1058)  lisinopril (PRINIVIL,ZESTRIL) tablet 20 mg (20 mg Oral Given 11/28/15 1327)    Labs Review Labs Reviewed  BASIC METABOLIC PANEL  CBC WITH DIFFERENTIAL/PLATELET    Imaging Review Mr Brain Wo Contrast  11/28/2015  CLINICAL DATA:  Left leg weakness.  Stroke. EXAM: MRI HEAD WITHOUT CONTRAST TECHNIQUE: Multiplanar, multiecho pulse sequences of the brain and surrounding structures were obtained without intravenous contrast. COMPARISON:  None.  FINDINGS: Acute infarct in the deep white matter posteriorly on the right. This involves the corona radiata and external capsule posteriorly. No other areas of acute infarct. Minimal chronic microvascular ischemic changes in the cerebral white matter. Brainstem normal. Ventricle size normal.  Cerebral volume normal. Negative for intracranial hemorrhage.  No fluid collection. Negative for mass or edema.  No shift of the midline structures. Mucosal edema in the paranasal sinuses.  Pituitary normal. Calvarium and skull base normal. Orbital contents normal. IMPRESSION: Acute infarct in the right posterior corona radiata and posterior external capsule. Minimal chronic microvascular ischemic change in the white matter. Otherwise negative Mucosal edema paranasal sinuses. Electronically Signed   By: Marlan Palauharles  Clark M.D.   On: 11/28/2015 11:58   I have personally reviewed and evaluated these images and lab results as part of my medical decision-making.   EKG Interpretation   Date/Time:  Friday November 28 2015 09:20:34 EDT Ventricular Rate:  69 PR Interval:    QRS Duration: 93 QT Interval:  414 QTC Calculation: 444 R Axis:   55 Text Interpretation:  Sinus rhythm Probable left atrial enlargement Left  ventricular hypertrophy No significant change since last tracing Confirmed  by Willmer Fellers  MD, [place] ([id number]) on 11/28/2015 9:50:56 AM     Patient was seen and evaluated by neurology.  Patient admitted to internal medicine. MDM   Final diagnoses:  Hypertensive urgency  Cerebral infarction due to unspecified mechanism        Nelva Nayobert Danika Kluender, MD 11/28/15 1337

## 2015-11-28 NOTE — ED Notes (Signed)
Admitting at bedside 

## 2015-11-28 NOTE — ED Notes (Signed)
Patient transported to MRI 

## 2015-11-28 NOTE — ED Notes (Signed)
Pt back from MRI. Transporting to [facility]M shortly.

## 2015-11-28 NOTE — H&P (Signed)
Date: 11/28/2015               Patient Name:  Eric Patterson MRN: [id number]  DOB: 1965/12/26 Age / Sex: 50 y.o., male   PCP: No Pcp Per Patient         Medical Service: Internal Medicine Teaching Service         Attending Physician: Dr. Doneen Poisson, MD    First Contact: Dr. Reymundo Poll Pager: [phone]  Second Contact: Dr. Griffin Basil Pager: [phone]       After Hours (After 5p/  First Contact Pager: [phone]  weekends / holidays): Second Contact Pager: [phone]   Chief Complaint: Dizziness   History of Present Illness: Eric Patterson is a 50 yo M with a pmhx of HTN and alcohol use disorder who presented to the ED after having difficulty walking at home. He said he was in his normal state of health until 7pm the evening prior when he was sitting on his porch and suddenly became dizzy when he stood up. He said he "walked it off" and continued with his normal nightly routine and took a shower and went to bed. When we woke in the morning and tried to get out of bed he had trouble walking and felt extremely weak on his L leg. He also endorses a tingling sensation in his L foot and his L fingertips. He denies any upper extremity weakness, blurred vision, headache, or slurred speech. Patient reports he ran out of his BP medication and missed his dose yesterday. He also admits to not taking his daily ASA. On presentation patient was hypertensive to 241/119, bradycardic in the 40s, and sating well on RA. MRI was consistent with an acute, right small vessel stroke.   Meds: Current Facility-Administered Medications  Medication Dose Route Frequency Provider Last Rate Last Dose  . acetaminophen (TYLENOL) tablet 650 mg  650 mg Oral Q4H PRN Lora Paula, MD       Or  . acetaminophen (TYLENOL) suppository 650 mg  650 mg Rectal Q4H PRN Lora Paula, MD      . aspirin EC tablet 81 mg  81 mg Oral Daily Lora Paula, MD   81 mg at 11/28/15 1639  . atorvastatin (LIPITOR) tablet 80 mg  80  mg Oral q1800 Lora Paula, MD   80 mg at 11/28/15 1639  . enoxaparin (LOVENOX) injection 40 mg  40 mg Subcutaneous Q24H Lora Paula, MD   40 mg at 11/28/15 1639  . senna-docusate (Senokot-S) tablet 1 tablet  1 tablet Oral QHS PRN Lora Paula, MD        Allergies: Allergies as of 11/28/2015  . (No Known Allergies)   Past Medical History  Diagnosis Date  . Hypertension     Family History:  - Brother with CVA in his 38s - Mom with CVA and MI  Social History: Lives at home with his wife. Drinks 6 beers and 1/2 pint of liquor a day. Quit for one year 2 years ago but restarted. Current smoker 1/2 PPD since the age of 65. Denies illicit drug use.   Review of Systems: A complete ROS was negative except as per HPI.   Physical Exam: Blood pressure 173/90, pulse 44, temperature 97.4 F (36.3 C), temperature source Oral, resp. rate 16, SpO2 100 %. Physical Exam   Constitutional: NAD, laying in bed HEENT: Normocephalic, atraumatic, anicteric sclera  Cardiovascular: Bradycardic, regular rhythm. No murmurs, rubs, or gallops.  Respiratory:  CTAB, no wheezes, rales, or rhonchi.  Gastrointestinal: Soft, non tender, non distended, +BS Extremities: No edema, distal pulses intact Mental status: Alert & oriented x3 Cranial Nerves: II: PERRL III, IV, VI: Extra-occular motions intact bilaterally V, VII: Face symmetric, sensation intact in all 3 divisions  VIII: hearing normal to rubbing fingers bilaterally  IX, X: palate rises symmetrically XI: Head turn and shoulder shrug normal bilaterally  XII: tongue midline  Motor: Strength 5/5 on all upper and lower extremities, bulk muscle and tone are normal  Sensory: Light touch intact Coordination: There is no dysmetria on finger-to-nose.  Gait: Unsteady gait  Psychiatric: Normal mood and affect  EKG: Normal sinus rhythm, LAE, LVH, no significant  changes since last ECG on 03/14/2012  11/28/15 MR Brain Wo Contrast:  IMPRESSION: Acute infarct in the right posterior corona radiata and posterior external capsule. Minimal chronic microvascular ischemic change in the white matter. Otherwise negative  Mucosal edema paranasal sinuses.  Assessment & Plan by Problem: Eric Patterson is a 50 yo M with a pmhx of HTN and alcohol use disorder who presents with dizziness and unsteady gate and found to have an acute, R small vessel stroke.   CVA: Reports missed doses of his HTN meds and non compliance with ASA. Last known normal was yesterday evening around 7pm and patient presented outside the window for thrombolytic therapy.  - ASA 81 daily  - Lipitor 80mg  daily  - Lipid panel pending  - US carotid duplex  - TTE pending  - MRA brain  - PT/OT  HTN: Allow for permissive HTN in the setting of acute stroke (up to 220/120) - Hold home lisinopril, HCTZ, and amlodipine   Alcohol Use Disorder: - CIWA monitoring    Dispo: Admit patient to Observation with expected length of stay less than 2 midnights.  Signed: Reymundo Pollarolyn Hendrick Pavich, MD 11/28/2015, 4:41 PM  Pager: [phone]

## 2015-11-28 NOTE — ED Notes (Signed)
Patient states stood up last night about 7pm and was lightheaded with gait difficulty and extremity weakness in L leg.

## 2015-11-28 NOTE — Progress Notes (Signed)
Patient admitted from ED. Patient alert and oriented x 4. Patient made comfortable and oriented to room. Tele placed. Will continue to monitor.

## 2015-11-28 NOTE — Consult Note (Signed)
Requesting Physician: Dr. Radford PaxBeaton    Chief Complaint: Stroke  History obtained from:  Patient    HPI:                                                                                                                                         Eric Patterson is an 50 y.o. male with known HTN, smokes and drinks excessive ETOH. HE ran out of one of his HTN medications yesterday but does not have a PCP to fill his Rx. He went to sleep at 2000 hours and felt normal. Awoke this AM and felt his left leg was weak and he had ortho stasis. The reason he came to ED was do to his orthostasis. MRI brain was obtained and confirmed a right small vessel stroke. He admits to not taking ASA daily.   Date last known well: Yesterday Time last known well: Time: 20:00 tPA Given: No: out of window   Past Medical History  Diagnosis Date  . Hypertension     History reviewed. No pertinent past surgical history.  Family History  Problem Relation Age of Onset  . Heart attack Mother   . Stroke Brother [id number]  . Stroke Other    Social History:  reports that he has been smoking Cigarettes.  He has been smoking about 0.50 packs per day. He does not have any smokeless tobacco history on file. He reports that he drinks alcohol. He reports that he does not use illicit drugs.  Allergies: No Known Allergies  Medications:                                                                                                                           No current facility-administered medications for this encounter.   Current Outpatient Prescriptions  Medication Sig Dispense Refill  . acetaminophen (TYLENOL) 500 MG tablet Take 500 mg by mouth every 6 (six) hours as needed (pain).    Marland Kitchen. amLODipine (NORVASC) 10 MG tablet Take 1 tablet (10 mg total) by mouth daily. 30 tablet 0  . hydrochlorothiazide (MICROZIDE) 12.5 MG capsule Take 1 capsule (12.5 mg total) by mouth daily. 30 capsule 0  . lisinopril (PRINIVIL,ZESTRIL) 20 MG tablet Take 1  tablet (20 mg total) by mouth once. (Patient taking differently: Take 20 mg by mouth daily. ) 30 tablet 0  . EPINEPHrine 0.3 mg/0.3 mL IJ  SOAJ injection Inject 0.3 mLs (0.3 mg total) into the muscle once. 1 Device 1  . [DISCONTINUED] lisinopril-hydrochlorothiazide (PRINZIDE,ZESTORETIC) 20-25 MG per tablet Take 1 tablet by mouth daily. (Patient not taking: Reported on 02/10/2015) 30 tablet 2     ROS:                                                                                                                                       History obtained from the patient  General ROS: negative for - chills, fatigue, fever, night sweats, weight gain or weight loss Psychological ROS: negative for - behavioral disorder, hallucinations, memory difficulties, mood swings or suicidal ideation Ophthalmic ROS: negative for - blurry vision, double vision, eye pain or loss of vision ENT ROS: negative for - epistaxis, nasal discharge, oral lesions, sore throat, tinnitus or vertigo Allergy and Immunology ROS: negative for - hives or itchy/watery eyes Hematological and Lymphatic ROS: negative for - bleeding problems, bruising or swollen lymph nodes Endocrine ROS: negative for - galactorrhea, hair pattern changes, polydipsia/polyuria or temperature intolerance Respiratory ROS: negative for - cough, hemoptysis, shortness of breath or wheezing Cardiovascular ROS: negative for - chest pain, dyspnea on exertion, edema or irregular heartbeat Gastrointestinal ROS: negative for - abdominal pain, diarrhea, hematemesis, nausea/vomiting or stool incontinence Genito-Urinary ROS: negative for - dysuria, hematuria, incontinence or urinary frequency/urgency Musculoskeletal ROS: negative for - joint swelling or muscular weakness Neurological ROS: as noted in HPI Dermatological ROS: negative for rash and skin lesion changes  Neurologic Examination:                                                                                                       Blood pressure 189/113, pulse 55, temperature 97.4 F (36.3 C), temperature source Oral, resp. rate 19, SpO2 100 %.  HEENT-  Normocephalic, no lesions, without obvious abnormality.  Normal external eye and conjunctiva.  Normal TM's bilaterally.  Normal auditory canals and external ears. Normal external nose, mucus membranes and septum.  Normal pharynx. Cardiovascular- S1, S2 normal, pulses palpable throughout   Lungs- chest clear, no wheezing, rales, normal symmetric air entry Abdomen- normal findings: bowel sounds normal Extremities- no edema Lymph-no adenopathy palpable Musculoskeletal-no joint tenderness, deformity or swelling Skin-warm and dry, no hyperpigmentation, vitiligo, or suspicious lesions  Neurological Examination Mental Status: Alert, oriented, thought content appropriate.  Speech fluent without evidence of aphasia.  Able to follow 3 step commands without difficulty. Cranial Nerves: II: Discs flat bilaterally; Visual fields grossly normal, pupils  equal, round, reactive to light and accommodation III,IV, VI: ptosis not present, extra-ocular motions intact bilaterally V,VII: smile symmetric, facial light touch sensation normal bilaterally VIII: hearing normal bilaterally IX,X: uvula rises symmetrically XI: bilateral shoulder shrug XII: midline tongue extension Motor: Right : Upper extremity   5/5    Left:     Upper extremity   5/5  Lower extremity   5/5     Lower extremity   5/5 Tone and bulk:normal tone throughout; no atrophy noted Sensory: Pinprick and light touch intact throughout, bilaterally Deep Tendon Reflexes: 2+ and symmetric throughout Plantars: Right: downgoing   Left: downgoing Cerebellar: normal finger-to-nose, normal rapid alternating movements and normal heel-to-shin test Gait: not tested       Lab Results: Basic Metabolic Panel:  Recent Labs Lab 11/28/15 1010  NA 138  K 4.3  CL 108  CO2 22  GLUCOSE 95  BUN 13  CREATININE  1.09  CALCIUM 9.0    Liver Function Tests: No results for input(s): AST, ALT, ALKPHOS, BILITOT, PROT, ALBUMIN in the last 168 hours. No results for input(s): LIPASE, AMYLASE in the last 168 hours. No results for input(s): AMMONIA in the last 168 hours.  CBC:  Recent Labs Lab 11/28/15 1010  WBC 5.4  NEUTROABS 3.5  HGB 15.2  HCT 44.0  MCV 89.1  PLT 284    Cardiac Enzymes: No results for input(s): CKTOTAL, CKMB, CKMBINDEX, TROPONINI in the last 168 hours.  Lipid Panel: No results for input(s): CHOL, TRIG, HDL, CHOLHDL, VLDL, LDLCALC in the last 168 hours.  CBG: No results for input(s): GLUCAP in the last 168 hours.  Microbiology: No results found for this or any previous visit.  Coagulation Studies: No results for input(s): LABPROT, INR in the last 72 hours.  Imaging: Mr Brain Wo Contrast  11/28/2015  CLINICAL DATA:  Left leg weakness.  Stroke. EXAM: MRI HEAD WITHOUT CONTRAST TECHNIQUE: Multiplanar, multiecho pulse sequences of the brain and surrounding structures were obtained without intravenous contrast. COMPARISON:  None. FINDINGS: Acute infarct in the deep white matter posteriorly on the right. This involves the corona radiata and external capsule posteriorly. No other areas of acute infarct. Minimal chronic microvascular ischemic changes in the cerebral white matter. Brainstem normal. Ventricle size normal.  Cerebral volume normal. Negative for intracranial hemorrhage.  No fluid collection. Negative for mass or edema.  No shift of the midline structures. Mucosal edema in the paranasal sinuses. Pituitary normal. Calvarium and skull base normal. Orbital contents normal. IMPRESSION: Acute infarct in the right posterior corona radiata and posterior external capsule. Minimal chronic microvascular ischemic change in the white matter. Otherwise negative Mucosal edema paranasal sinuses. Electronically Signed   By: Marlan Palau M.D.   On: 11/28/2015 11:58       Assessment and  plan discussed with with attending physician and they are in agreement.    Felicie Morn PA-C [facility] [phone]  11/28/2015, 1:02 PM   Assessment: 50 y.o. male with acute right internal capsul and corona radiata infarct after missing his HTN medication. Current BP is 193/130. Stroke likely secondary to his HTN.   Stroke Risk Factors - hypertension and smoking   Recommend: 1. HgbA1c, fasting lipid panel 2. MRI, MRA  of the brain without contrast 3. PT consult, OT consult, Speech consult 4. Echocardiogram 5. Carotid dopplers 6. Prophylactic therapy-Antiplatelet med: Aspirin - dose 325 mg daily 7. Risk factor modification 8. Telemetry monitoring 9. Frequent neuro checks 10 NPO until passes stroke swallow screen 11 please  page stroke NP  Or  PA  Or MD from 8am -4 pm  as this patient from this time will be  followed by the stroke.   You can look them up on [email or web address]  Password [email or web address]

## 2015-11-29 ENCOUNTER — Inpatient Hospital Stay (HOSPITAL_COMMUNITY): Payer: 59

## 2015-11-29 ENCOUNTER — Other Ambulatory Visit (HOSPITAL_COMMUNITY): Payer: PRIVATE HEALTH INSURANCE

## 2015-11-29 ENCOUNTER — Encounter (HOSPITAL_COMMUNITY): Payer: Self-pay | Admitting: Radiology

## 2015-11-29 DIAGNOSIS — I639 Cerebral infarction, unspecified: Secondary | ICD-10-CM

## 2015-11-29 DIAGNOSIS — I6789 Other cerebrovascular disease: Secondary | ICD-10-CM

## 2015-11-29 LAB — LIPID PANEL
CHOL/HDL RATIO: 4.7 ratio
CHOLESTEROL: 161 mg/dL (ref 0–200)
HDL: 34 mg/dL — AB (ref >40)
LDL Cholesterol: 95 mg/dL (ref 0–99)
TRIGLYCERIDES: 160 mg/dL — AB (ref <150)
VLDL: 32 mg/dL (ref 0–40)

## 2015-11-29 LAB — RAPID URINE DRUG SCREEN, HOSP PERFORMED
Amphetamines: NOT DETECTED
Barbiturates: NOT DETECTED
Benzodiazepines: NOT DETECTED
Cocaine: NOT DETECTED
OPIATES: NOT DETECTED
TETRAHYDROCANNABINOL: NOT DETECTED

## 2015-11-29 LAB — ECHOCARDIOGRAM COMPLETE
Height: 65 in
Weight: 2765.45 oz

## 2015-11-29 LAB — HEMOGLOBIN A1C
Hgb A1c MFr Bld: 5.3 % (ref 4.8–5.6)
Mean Plasma Glucose: 105 mg/dL

## 2015-11-29 MED ORDER — ASPIRIN 81 MG PO TBEC: 81.0000 mg | DELAYED_RELEASE_TABLET | Freq: Every day | ORAL | Status: DC

## 2015-11-29 MED ORDER — ATORVASTATIN CALCIUM 80 MG PO TABS
80.0000 mg | ORAL_TABLET | Freq: Every day | ORAL | Status: DC
Start: 2015-11-29 — End: 2015-11-30

## 2015-11-29 MED ORDER — IOPAMIDOL (ISOVUE-370) INJECTION 76%
INTRAVENOUS | Status: AC
  Administered 2015-11-29: 50 mL
  Filled 2015-11-29: qty 50

## 2015-11-29 MED ORDER — HYDROCHLOROTHIAZIDE 12.5 MG PO CAPS: 12.5000 mg | ORAL_CAPSULE | Freq: Every day | ORAL | Status: DC

## 2015-11-29 MED ORDER — AMLODIPINE BESYLATE 10 MG PO TABS: 10.0000 mg | ORAL_TABLET | Freq: Every day | ORAL | Status: DC

## 2015-11-29 NOTE — Progress Notes (Signed)
Pt's B/P showing 208/109. RN contacted MD on call and reported findings. MD made aware. Pt alert and oriented and appeared comfortable.

## 2015-11-29 NOTE — Progress Notes (Signed)
  Echocardiogram 2D Echocardiogram has been performed.  Arvil ChacoFoster, Nedda Gains 11/29/2015, 12:49 PM

## 2015-11-29 NOTE — Progress Notes (Signed)
STROKE TEAM PROGRESS NOTE   HISTORY OF PRESENT ILLNESS (per record) Eric Patterson is an 50 y.o. male with known HTN, smokes and drinks excessive ETOH. He ran out of one of his HTN medications yesterday but does not have a PCP to fill his Rx. He went to sleep at 2000 hours and felt normal. Awoke this AM and felt his left leg was weak and he had ortho stasis. The reason he came to ED was due to his orthostasis. MRI brain was obtained and confirmed a right small vessel stroke. He admits to not taking ASA daily.   Date last known well: Yesterday Time last known well: Time: 20:00 tPA Given: No: out of window   SUBJECTIVE (INTERVAL HISTORY) His wife was at the beside.  Overall he feels his condition is gradually improving. We reviewed MRI together.  ROS was only positive for "limping"   OBJECTIVE Temp:  [97 F (36.1 C)-98.2 F (36.8 C)] 98.1 F (36.7 C) (07/15 0400) Pulse Rate:  [43-84] 55 (07/15 0400) Cardiac Rhythm:  [-] Sinus bradycardia (07/15 0725) Resp:  [14-20] 18 (07/15 0400) BP: (128-247)/(77-139) 131/91 mmHg (07/15 0400) SpO2:  [97 %-100 %] 99 % (07/15 0400) Weight:  [78.4 kg (172 lb 13.5 oz)] 78.4 kg (172 lb 13.5 oz) (07/15 0000)  CBC:  Recent Labs Lab 11/28/15 1010  WBC 5.4  NEUTROABS 3.5  HGB 15.2  HCT 44.0  MCV 89.1  PLT 284    Basic Metabolic Panel:  Recent Labs Lab 11/28/15 1010  NA 138  K 4.3  CL 108  CO2 22  GLUCOSE 95  BUN 13  CREATININE 1.09  CALCIUM 9.0    Lipid Panel:    Component Value Date/Time   CHOL 161 11/29/2015 0233   TRIG 160* 11/29/2015 0233   HDL 34* 11/29/2015 0233   CHOLHDL 4.7 11/29/2015 0233   VLDL 32 11/29/2015 0233   LDLCALC 95 11/29/2015 0233   HgbA1c: No results found for: HGBA1C Urine Drug Screen: No results found for: LABOPIA, COCAINSCRNUR, LABBENZ, AMPHETMU, THCU, LABBARB    IMAGING  Mr Brain Wo Contrast 11/28/2015   Acute infarct in the right posterior corona radiata and posterior external capsule. Minimal  chronic microvascular ischemic change in the white matter. Otherwise negative Mucosal edema paranasal sinuses.   Mr Maxine Glenn Head/brain Wo Cm 11/28/2015   1. Asymmetric caliber of the distal internal carotid arteries bilaterally suggesting a more proximal stenosis on the left.  2. Moderate narrowing of M2 segments on the left beyond a proximal bifurcation.  3. Moderate distal segmental stenoses of MCA and ACA branch vessels, left greater than right.  4. Fetal type posterior cerebral arteries with small P1 segments bilaterally.     PHYSICAL EXAM Blood pressure 189/113, pulse 55, temperature 97.4 F (36.3 C), temperature source Oral, resp. rate 19, SpO2 100 %.  HEENT- Normocephalic, no lesions, without obvious abnormality. Normal external eye and conjunctiva.  Cardiovascular- S1, S2 normal, pulses palpable throughout  Lungs- chest clear, no wheezing, rales, normal symmetric air entry Abdomen- normal findings: bowel sounds normal Extremities- no edema Lymph-no adenopathy palpable Musculoskeletal-no joint tenderness, deformity or swelling Skin-warm and dry, no hyperpigmentation, vitiligo, or suspicious lesions  Neurological Examination Mental Status: Alert, oriented, thought content appropriate. Speech fluent without evidence of aphasia. Able to follow 3 step commands without difficulty. Cranial Nerves: II: Discs flat bilaterally; Visual fields grossly normal, pupils equal, round, reactive to light and accommodation III,IV, VI: ptosis not present, extra-ocular motions intact bilaterally V,VII: smile symmetric, facial  light touch sensation normal bilaterally VIII: hearing normal bilaterally IX,X: uvula rises symmetrically XI: bilateral shoulder shrug XII: midline tongue extension Motor: Right :Upper extremity 5/5Left: Upper extremity 5/5 Lower extremity 5/5Lower extremity  4+/5 Tone and bulk:normal tone throughout; no atrophy noted Sensory: light touch intact throughout, bilaterally Cerebellar: normal finger-to-nose, normal rapid alternating movements and normal heel-to-shin test on right not left  Gait: not tested   ASSESSMENT/PLAN Eric Patterson is a 50 y.o. male with history of hypertension, alcohol abuse, medical noncompliance, and ongoing tobacco use presenting with left leg weakness and orthostasis. He did not receive IV t-PA due to late presentation.  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  Resultant  Mild weakness on left  MRI - acute infarct in the right posterior corona radiata and posterior external capsule.   MRA - bilateral mild to moderate disease as noted above.  Carotid Doppler -  40-59% distal right ICA stenosis, highest end of scale. 1-39% left ICA plaquing. Bilateral vertebral artery flow is antegrade.   2D Echo - EF 60-65%. No cardiac source of emboli identified.  LDL - 95  HgbA1c pending  VTE prophylaxis - Lovenox  Diet Heart Room service appropriate?: Yes; Fluid consistency:: Thin  No antithrombotic prior to admission, now on aspirin 81 mg daily  Patient counseled to be compliant with his antithrombotic medications  Ongoing aggressive stroke risk factor management  Therapy recommendations: Pending  Disposition:  Pending  Hypertension  Stable  Permissive hypertension (OK if < 220/120) but gradually normalize in 5-7 days  Long-term BP goal normotensive  Hyperlipidemia  Home meds: No lipid lowering medications prior to admission  LDL 95, goal < 70  Now on Lipitor 80 mg daily  Continue statin at discharge  Other Stroke Risk Factors  Cigarette smoker - advised to stop smoking  ETOH use, advised to drink no more than 1 - 2 drink(s) a day  Family hx stroke (mother and brother)   Other Active Problems     ATTENDING NOTE: Patient was seen  and examined by me personally. Documentation reflects findings. The laboratory and radiographic studies reviewed by me. ROS completed by me personally and pertinent positives fully documented  Condition: stable  Assessment and plan completed by me personally and fully documented above. Plans/Recommendations include:     Consider CT angiogram of head and neck to further evaluate right internal carotid artery stenosis.  Consider hypercoagulable workup  Consider urine drug screen  ECHO pending  SIGNED BY: Dr. Alesia Bandahere Tyla Burgner    Hospital day # 1     To contact Stroke Continuity provider, please refer to [email or web address]. After hours, contact General Neurology

## 2015-11-29 NOTE — Progress Notes (Signed)
VASCULAR LAB PRELIMINARY  PRELIMINARY  PRELIMINARY  PRELIMINARY  Carotid duplex completed.    Preliminary report:  40-59% distal right ICA stenosis, highest end of scale.  1-39% left ICA plaquing.  Bilateral vertebral artery flow is antegrade.   Khoury Siemon, RVT 11/29/2015, 9:18 AM

## 2015-11-29 NOTE — Evaluation (Signed)
Physical Therapy Evaluation Patient Details Name: Eric Patterson MRN: [id number] DOB: [birth year] Today's Date: 11/29/2015   History of Present Illness  50 yo M with a pmhx of HTN and alcohol use disorder who presented to the ED after having difficulty walking at home. MRI was consistent with an acute, right small vessel stroke.   Clinical Impression  Patient presents with decreased independence with mobility due to deficits listed in PT problem list (L incoordination and weakness), and will benefit from skilled PT in the acute setting to allow return home with family support and follow up outpatient PT.      Follow Up Recommendations Outpatient PT;Supervision - Intermittent    Equipment Recommendations  Cane    Recommendations for Other Services       Precautions / Restrictions Precautions Precautions: Fall Restrictions Weight Bearing Restrictions: No      Mobility  Bed Mobility Overal bed mobility: Modified Independent             General bed mobility comments: Pt able to return to bed at a mod I level. Able to scoot up in bed without assist.  Transfers Overall transfer level: Needs assistance Equipment used: None Transfers: Sit to/from Stand Sit to Stand: Min guard         General transfer comment: assist for safety, mild imbalance  Ambulation/Gait Ambulation/Gait assistance: Min assist;Min guard Ambulation Distance (Feet): 225 Feet Assistive device: None;Straight cane Gait Pattern/deviations: Step-to pattern;Step-through pattern;Decreased stride length;Decreased dorsiflexion - left;Shuffle;Decreased stance time - left;Decreased step length - right     General Gait Details: demonstrates toe drag initially on L and L arm coming up with increased tone; facilitation for L arm extension and increased stance time on L initially, then with cane after stair negotiation able to sequence with cues and demonstrated improved L heel strike and foot clearance with slower pace  and increased compensations.  Stairs Stairs: Yes Stairs assistance: Min guard Stair Management: Two rails;Step to pattern;Forwards Number of Stairs: 3 General stair comments: cues for sequence for safey  Wheelchair Mobility    Modified Rankin (Stroke Patients Only) Modified Rankin (Stroke Patients Only) Pre-Morbid Rankin Score: No symptoms Modified Rankin: Moderately severe disability     Balance Overall balance assessment: Needs assistance Sitting-balance support: No upper extremity supported;Feet supported Sitting balance-Leahy Scale: Good Sitting balance - Comments: LOB posterior with LE MMT, but self corrected; static balance is fine   Standing balance support: No upper extremity supported Standing balance-Leahy Scale: Fair Standing balance comment: static balance without UE support, but needs assist for ambulation/dynamic activities                             Pertinent Vitals/Pain Pain Assessment: No/denies pain    Home Living Family/patient expects to be discharged to:: Private residence Living Arrangements: Spouse/significant other Available Help at Discharge: Family;Available 24 hours/day Type of Home: Mobile home Home Access: Stairs to enter Entrance Stairs-Rails: Lawyer of Steps: 6 Home Layout: One level Home Equipment: None      Prior Function Level of Independence: Independent         Comments: Works in [employer]   Dominant Hand: Right    Extremity/Trunk Assessment   Upper Extremity Assessment: Defer to OT evaluation       LUE Deficits / Details: AROM and strength overall WFL. Slight decrease in fine and gross motor coordination.   Lower Extremity Assessment: RLE deficits/detail;LLE deficits/detail  RLE Deficits / Details: WFL LLE Deficits / Details: AROM WFL, strength hip flexion 4-/5, knee extension 4/5, ankle DF 3+/5, sensation intact and equal to light touch bilateral,  decreased coordination with toe taps  Cervical / Trunk Assessment: Normal  Communication   Communication: No difficulties  Cognition Arousal/Alertness: Awake/alert Behavior During Therapy: WFL for tasks assessed/performed Overall Cognitive Status: Within Functional Limits for tasks assessed                      General Comments General comments (skin integrity, edema, etc.): wife in the room and reports able to assist if needed, drive to outpatient rehab for follow up therapy; educated in/discussed stroke risk factors and modifications for prevention.     Exercises Other Exercises Other Exercises: Educated pt on L UE fine and gross motor coordination exercises and encouraged functional use of L UE.      Assessment/Plan    PT Assessment Patient needs continued PT services  PT Diagnosis Abnormality of gait;Hemiplegia non-dominant side   PT Problem List Decreased strength;Decreased balance;Decreased mobility;Decreased coordination;Decreased safety awareness;Decreased knowledge of use of DME  PT Treatment Interventions DME instruction;Balance training;Gait training;Stair training;Functional mobility training;Patient/family education;Therapeutic activities;Therapeutic exercise;Neuromuscular re-education   PT Goals (Current goals can be found in the Care Plan section) Acute Rehab PT Goals Patient Stated Goal: return to PLOF PT Goal Formulation: With patient/family Time For Goal Achievement: 12/06/15 Potential to Achieve Goals: Good    Frequency Min 4X/week   Barriers to discharge        Co-evaluation               End of Session Equipment Utilized During Treatment: Gait belt Activity Tolerance: Patient tolerated treatment well Patient left: in bed;with call bell/phone within reach;with family/visitor present;Other (comment) (handoff to OT)           Time: [id number] PT Time Calculation (min) (ACUTE ONLY): 24 min   Charges:   PT Evaluation $PT Eval  Moderate Complexity: 1 Procedure PT Treatments $Gait Training: 8-22 mins   PT G CodesElray Patterson:        Eric Patterson 11/29/2015, 4:23 PM  Eric Patterson, PT [phone] 11/29/2015

## 2015-11-29 NOTE — Progress Notes (Signed)
   Subjective: Patient feels well this morning. Continues to have numbness and tingling in his L fingertips and unsteady gait. No other complaints.   Objective: Vital signs in last 24 hours: Filed Vitals:   11/29/15 0000 11/29/15 0200 11/29/15 0400 11/29/15 0940  BP: 142/91 128/87 131/91 164/99  Pulse: 55 57 55 49  Temp:   98.1 F (36.7 C) 97.9 F (36.6 C)  TempSrc:   Oral Oral  Resp: 18 18 18 18   Height: 5\' 5"  (1.651 m)     Weight: 172 lb 13.5 oz (78.4 kg)     SpO2: 97% 99% 99% 100%   Labs:   Ref. Range 11/29/2015 02:33  Cholesterol Latest Ref Range: 0-200 mg/dL 161161  Triglycerides Latest Ref Range: <150 mg/dL 096160 (H)  HDL Cholesterol Latest Ref Range: >40 mg/dL 34 (L)  LDL (calc) Latest Ref Range: 0-99 mg/dL 95  VLDL Latest Ref Range: 0-40 mg/dL 32  Total CHOL/HDL Ratio Latest Units: RATIO 4.7   A1C - Pending   Imaging:   11/28/15 MRA Head/Brain Wo Contrast IMPRESSION: 1. Asymmetric caliber of the distal internal carotid arteries bilaterally suggesting a more proximal stenosis on the left. 2. Moderate narrowing of M2 segments on the left beyond a proximal bifurcation. 3. Moderate distal segmental stenoses of MCA and ACA branch vessels, left greater than right. 4. Fetal type posterior cerebral arteries with small P1 segments bilaterally.  11/28/15 TTE - read pending   Physical Exam Constitutional: NAD, laying in bed HEENT: Normocephalic, atraumatic, anicteric sclera  Cardiovascular: Bradycardic, regular rhythm. No murmurs, rubs, or gallops.  Respiratory: CTAB, no wheezes, rales, or rhonchi.  Gastrointestinal: Soft, non tender, non distended, +BS Extremities: No edema, distal pulses intact Mental status: Alert & oriented x3 Cranial Nerves: II: PERRL III, IV, VI: Extra-occular motions intact bilaterally V, VII: Face symmetric, sensation intact in all 3 divisions  VIII: hearing normal to rubbing fingers  bilaterally  IX, X: palate rises symmetrically XI: Head turn and shoulder shrug normal bilaterally  XII: tongue midline  Motor: Strength 5/5 on all upper and lower extremities, bulk muscle and tone are normal  Sensory: Light touch intact Coordination: There is no dysmetria on finger-to-nose.  Gait: Unsteady gait  Psychiatric: Normal mood and affect  Assessment/Plan: Assessment & Plan by Problem: [name] is a 50 yo M with a pmhx of HTN and alcohol use disorder who presents with dizziness and unsteady gate and found to have an acute, right internal capsul and corona radiata infarct .   CVA: Reports missed doses of his HTN meds and non compliance with ASA. Last known normal was the evening prior to admission around 7pm. Patient presented outside the window for thrombolytic therapy.  - ASA 81 daily  - Lipitor 80mg  daily  - Lipid panel normal  - A1C pending  - US carotid duplex pending   - TTE pending  - MRA brain with moderate stenosis of L internal carotid artery  - PT/OT - Neuro following, appreciate recs  HTN: Allow for permissive HTN in the setting of acute stroke (up to 220/120) - Hold home lisinopril, HCTZ, and amlodipine   Alcohol Use Disorder: - CIWA monitoring   Dispo: Anticipated discharge in approximately 1-2 day(s).   LOS: 1 day   Reymundo Pollarolyn Gerold Sar, MD 11/29/2015, 10:42 AM Pager: [phone]

## 2015-11-29 NOTE — Evaluation (Signed)
Occupational Therapy Evaluation and Discharge Patient Details Name: Eric Patterson MRN: [id number] DOB: 08/16/1965 Today's Date: 11/29/2015    History of Present Illness 50 yo M with a pmhx of HTN and alcohol use disorder who presented to the ED after having difficulty walking at home. MRI was consistent with an acute, right small vessel stroke.    Clinical Impression   Pt reports he was independent with ADLs and mobility PTA. Currently pt is overall min guard for safety with ADLs and functional mobility due to decreased balance in standing. Pt presenting with mild LUE fine and gross motor coordination deficits; educated pt on exercises for improving fine/gross motor coordination. Educated pt and wife on use of 3 in 1 over toilet and in tub as a seat; pt is agreeable to 3 in 1 for use at home. Educated on home safety and fall prevention. No further acute OT needs identified; signing off at this time. Please re-consult if needs change. Thank you for this referral.    Follow Up Recommendations  No OT follow up;Supervision - Intermittent    Equipment Recommendations  3 in 1 bedside comode    Recommendations for Other Services       Precautions / Restrictions Precautions Precautions: None Restrictions Weight Bearing Restrictions: No      Mobility Bed Mobility Overal bed mobility: Modified Independent             General bed mobility comments: Pt able to return to bed at a mod I level. Able to scoot up in bed without assist.  Transfers Overall transfer level: Needs assistance Equipment used: None Transfers: Sit to/from Stand Sit to Stand: Min guard         General transfer comment: Min guard for safety; pt slightly unsteady on feet but does not require physical assist at this time.    Balance Overall balance assessment: Needs assistance Sitting-balance support: No upper extremity supported;Feet supported Sitting balance-Leahy Scale: Good     Standing balance support:  No upper extremity supported;During functional activity Standing balance-Leahy Scale: Fair                              ADL Overall ADL's : Needs assistance/impaired Eating/Feeding: Independent;Sitting   Grooming: Min guard;Standing   Upper Body Bathing: Set up;Supervision/ safety;Sitting   Lower Body Bathing: Min guard;Sit to/from stand   Upper Body Dressing : Set up;Supervision/safety;Sitting   Lower Body Dressing: Min guard;Sit to/from stand   Toilet Transfer: Min guard;Ambulation;Regular Toilet;Grab bars Toilet Transfer Details (indicate cue type and reason): Educated pt on use of 3 in 1 over toilet; pt and wife verbalized understanding. Toileting- ArchitectClothing Manipulation and Hygiene: Min guard;Sit to/from stand   Tub/ Shower Transfer: Min guard;Tub transfer;Ambulation;3 in 1 Tub/Shower Transfer Details (indicate cue type and reason): Educated pt on tub transfer technique; pt able to perfom simulated transfer with min guard assist. Educated on use of 3 in 1 in tub as a seat for safety with bathing. Functional mobility during ADLs: Min guard General ADL Comments: Educated pt on home safety and fall prevention strategies; pt and wife verbalize understanding.     Vision Additional Comments: Appears WFL   Perception     Praxis      Pertinent Vitals/Pain Pain Assessment: No/denies pain     Hand Dominance Right   Extremity/Trunk Assessment Upper Extremity Assessment Upper Extremity Assessment: LUE deficits/detail LUE Deficits / Details: AROM and strength overall WFL. Slight decrease in  fine and gross motor coordination. LUE Coordination: decreased fine motor;decreased gross motor   Lower Extremity Assessment Lower Extremity Assessment: Defer to PT evaluation   Cervical / Trunk Assessment Cervical / Trunk Assessment: Normal   Communication Communication Communication: No difficulties   Cognition Arousal/Alertness: Awake/alert Behavior During Therapy:  WFL for tasks assessed/performed Overall Cognitive Status: Within Functional Limits for tasks assessed                     General Comments       Exercises Exercises: Other exercises Other Exercises Other Exercises: Educated pt on L UE fine and gross motor coordination exercises and encouraged functional use of L UE.   Shoulder Instructions      Home Living Family/patient expects to be discharged to:: Private residence Living Arrangements: Spouse/significant other Available Help at Discharge: Family;Available 24 hours/day Type of Home: Mobile home             Bathroom Shower/Tub: Tub/shower unit Shower/tub characteristics: Curtain Bathroom Toilet: Handicapped height     Home Equipment: None          Prior Functioning/Environment Level of Independence: Independent        Comments: Works in maintenance    OT Diagnosis: Generalized weakness   OT Problem List:     OT Treatment/Interventions:      OT Goals(Current goals can be found in the care plan section) Acute Rehab OT Goals Patient Stated Goal: return to PLOF OT Goal Formulation: All assessment and education complete, DC therapy  OT Frequency:     Barriers to D/C:            Co-evaluation              End of Session    Activity Tolerance: Patient tolerated treatment well Patient left: in bed;with call bell/phone within reach;with bed alarm set;with family/visitor present   Time: 1914-7829 OT Time Calculation (min): 22 min Charges:  OT General Charges $OT Visit: 1 Procedure OT Evaluation $OT Eval Low Complexity: 1 Procedure G-Codes:     Gaye Alken M.S., OTR/L Pager: [phone]  11/29/2015, 3:36 PM

## 2015-11-29 NOTE — Discharge Summary (Signed)
Name: Gillis SantaHenry Labella MRN: [id number] DOB: 10/27/1965 50 y.o. PCP: No Pcp Per Patient  Date of Admission: 11/28/2015  9:14 AM Date of Discharge: 12/01/2015 Attending Physician: No att. providers found  Discharge Diagnosis: 1. CVA Active Problems:   Hypertension   Stroke [facility](HCC)   Hypertensive urgency   Cerebral infarction due to unspecified mechanism   Discharge Medications:   Medication List    TAKE these medications        acetaminophen 500 MG tablet  Commonly known as:  TYLENOL  Take 500 mg by mouth every 6 (six) hours as needed (pain).     amLODipine 10 MG tablet  Commonly known as:  NORVASC  Take 1 tablet (10 mg total) by mouth daily.     aspirin 81 MG EC tablet  Take 1 tablet (81 mg total) by mouth daily.     atorvastatin 80 MG tablet  Commonly known as:  LIPITOR  Take 1 tablet (80 mg total) by mouth daily at 6 PM.     EPINEPHrine 0.3 mg/0.3 mL Soaj injection  Commonly known as:  EPI-PEN  Inject 0.3 mLs (0.3 mg total) into the muscle once.     hydrochlorothiazide 12.5 MG capsule  Commonly known as:  MICROZIDE  Take 1 capsule (12.5 mg total) by mouth daily.     lisinopril 20 MG tablet  Commonly known as:  PRINIVIL,ZESTRIL  Take 1 tablet (20 mg total) by mouth daily.  Start taking on:  12/03/2015        Disposition and follow-up:   Mr.Terence Quindarius [facility] in Stable condition.  At the hospital follow up visit please address:  1.  Patient started on Lipitor 80 daily and restarted on home ASA 81 daily. Please follow up with patient on medication compliance, reported he was not taking his ASA on admission. Instructed to restart home BP meds slowly (amldipine on 7/16, HCTZ on 7/17, and lisinopril after PCP follow up). Also follow up with patient on functional status and any symptom resolution (unsteady gate, LLE weakness, and tingling in his L foot and L fingertips).  2.  Labs / imaging needed at time of follow-up:  None  3.  Pending labs/ test needing follow-up: None  Follow-up Appointments: Follow-up Information    Follow up with [facility].   Why:  Our office will call you with a follow up appointment for 1-2 weeks   Contact information:   [address] [place] [id number] [phone]      Follow up with SETHI,PRAMOD, MD. Schedule an appointment as soon as possible for a visit in 2 months.   Specialties:  Neurology, Radiology   Why:  Stroke Clinic   Contact information:   [address] [place] [address] [phone]       Follow up with [facility].   Why:  3n1 and a cane   Contact information:   [address] [place] [address] [phone]       Hospital Course by problem list: Active Problems:   Hypertension   Stroke [facility](HCC)   Hypertensive urgency   Cerebral infarction due to unspecified mechanism   1. CVA of the right posterior corona radiata and posterior external capsule secondary to uncontrolled HTN: [name] is a 50 yo M with a pmhx of HTN and alcohol use disorder who presented to the ED after having difficulty walking at home. He said  he was in his normal state of health until 7pm the evening prior when he was sitting on his porch and suddenly became dizzy when he stood up. He said he "walked it off" and continued with his normal nightly routine and took a shower and went to bed. When we woke in the morning and tried to get out of bed he had trouble walking and felt extremely weak on his L leg. He also endorsed a tingling sensation in his L foot and his L fingertips. He denied any upper extremity weakness, blurred vision, headache, or slurred speech. Patient reported that he ran out of his BP medication and missed his dose the day before. He also admitted to not taking his daily ASA. On presentation patient was hypertensive to 241/119 and bradycardic in the 40s. MRI was consistent an acute  infarct in the right posterior corona radiata and posterior external capsule. MRA Head/Brain revealed L internal carotid artery stenosis and moderate stenosis of the MCA and ACA branch vessels L>R. CTA Head & Neck was significant for an atheromatous plaque at the proximal L ICA with a short segment of stenosis up to 30% by NASCET, evolving ischemic infarct within the right external capsule/corona radiata (stable from previous MRI) and moderate atheromatous narrowing (approximately 50%) of the cavernous/supraclinoid left ICA. Carotid doppler revealed 40-50% distal right stenosis, highest end of scale. TTE with normal systolic function, EF 60-65%, normal wall motion, and grade 1 diastolic dysfunction. Lipid panel with LDL 95 (goal <70), and A1C of 5.3. Patient was started on Lipitor 80 mg daily, restarted on ASA 81, and home BP meds were held to allow for permissive HTN (<220/120). Patient was discharge with instructions to slowly restart BP meds and to f/u with PCP.   Discharge Vitals:   BP 196/93 mmHg  Pulse 67  Temp(Src) 97.6 F (36.4 C) (Oral)  Resp 16  Ht  (1.651 m)  Wt 172 lb 13.5 oz (78.4 kg)  BMI 28.76 kg/m2  SpO2 100%  Pertinent Labs, Studies, and Procedures:   11/28/15 MR Brain Wo Contrast: IMPRESSION: Acute infarct in the right posterior corona radiata and posterior external capsule. Minimal chronic microvascular ischemic change in the white matter. Otherwise negative  Mucosal edema paranasal sinuses.  11/28/15 MRA Head/Brain Wo Contrast IMPRESSION: 1. Asymmetric caliber of the distal internal carotid arteries bilaterally suggesting a more proximal stenosis on the left. 2. Moderate narrowing of M2 segments on the left beyond a proximal bifurcation. 3. Moderate distal segmental stenoses of MCA and ACA branch vessels, left greater than right. 4. Fetal type posterior cerebral arteries with small P1 segments bilaterally.  11/29/15 CT Angio Head & Neck CTA NECK  IMPRESSION: 1. Atheromatous plaque at the proximal left ICA with associated short segment stenosis of up to 30% by NASCET criteria. Left ICA diffusely small in caliber as compared to the right. 2. Calcified and noncalcified atheromatous plaque at the right carotid bifurcation/proximal right ICA without flow-limiting stenosis. 3. Patent vertebral arteries within the neck.  CTA HEAD IMPRESSION: 1. Evolving ischemic infarct within the right external capsule/corona radiata, stable from previous MRI. No associated hemorrhage or mass effect. 2. Negative for large vessel occlusion. 3. Moderate atheromatous narrowing (approximately 50%) of the cavernous/supraclinoid left ICA. This is superimposed on an asymmetrically small left ICA, suspected to be at least in part due to the hypoplastic/absent left A1 segment. 4. Moderate atheromatous irregularity with stenoses involving the MCA branches bilaterally, left greater than right. 5. Fetal type PCAs with diminutive vertebrobasilar  system.  11/29/15 TTE Study Conclusions - Left ventricle: The cavity size was normal. There was mild  concentric hypertrophy. Systolic function was normal. The  estimated ejection fraction was in the range of 60% to 65%. Wall  motion was normal; there were no regional wall motion  abnormalities. Doppler parameters are consistent with abnormal  left ventricular relaxation (grade 1 diastolic dysfunction).  Doppler parameters are consistent with elevated ventricular  end-diastolic filling pressure. - Aortic valve: There was no regurgitation. - Aortic root: The aortic root was normal in size. - Mitral valve: There was trivial regurgitation. - Left atrium: The atrium was normal in size. - Right ventricle: Systolic function was normal. - Right atrium: The atrium was mildly dilated. - Tricuspid valve: There was moderate regurgitation. - Pulmonic valve: There was no regurgitation. - Pulmonary arteries: Systolic  pressure was within the normal  range. - Inferior vena cava: The vessel was normal in size. - Pericardium, extracardiac: There was no pericardial effusion.   Ref. Range 11/29/2015 02:33  Cholesterol Latest Ref Range: 0-200 mg/dL 161  Triglycerides Latest Ref Range: <150 mg/dL 096 (H)  HDL Cholesterol Latest Ref Range: >40 mg/dL 34 (L)  LDL (calc) Latest Ref Range: 0-99 mg/dL 95  VLDL Latest Ref Range: 0-40 mg/dL 32  Total CHOL/HDL Ratio Latest Units: RATIO 4.7    Ref. Range 11/28/2015 16:17  Hemoglobin A1C Latest Ref Range: 4.8-5.6 % 5.3    Discharge Instructions: Discharge Instructions    Ambulatory referral to Neurology    Complete by:  As directed   Dr. Pearlean Brownie requests follow up for this patient in 2 months.     Call MD for:  difficulty breathing, headache or visual disturbances    Complete by:  As directed      Call MD for:  persistant dizziness or light-headedness    Complete by:  As directed      Diet - low sodium heart healthy    Complete by:  As directed      Discharge instructions    Complete by:  As directed   Please restart BP medicine as instructed: Please start taking amlodipine on Sunday and Hydrochlorothiazide on Monday. You can restart your lisinopril on Tuesday.     Increase activity slowly    Complete by:  As directed            Signed: Reymundo Poll, MD 12/01/2015, 1:20 PM   Pager: [phone]

## 2015-11-30 ENCOUNTER — Telehealth: Payer: Self-pay | Admitting: Internal Medicine

## 2015-11-30 DIAGNOSIS — I159 Secondary hypertension, unspecified: Secondary | ICD-10-CM

## 2015-11-30 DIAGNOSIS — I639 Cerebral infarction, unspecified: Principal | ICD-10-CM

## 2015-11-30 MED ORDER — AMLODIPINE BESYLATE 10 MG PO TABS: 10.0000 mg | ORAL_TABLET | Freq: Every day | ORAL | Status: DC

## 2015-11-30 MED ORDER — ATORVASTATIN CALCIUM 80 MG PO TABS: 80.0000 mg | ORAL_TABLET | Freq: Every day | ORAL | Status: DC

## 2015-11-30 MED ORDER — HYDROCHLOROTHIAZIDE 12.5 MG PO CAPS
12.5000 mg | ORAL_CAPSULE | Freq: Every day | ORAL | Status: DC
  Administered 2015-11-30: 12.5 mg via ORAL
  Filled 2015-11-30: qty 1

## 2015-11-30 MED ORDER — LISINOPRIL 20 MG PO TABS: 20.0000 mg | ORAL_TABLET | Freq: Every day | ORAL | Status: DC

## 2015-11-30 MED ORDER — HYDROCHLOROTHIAZIDE 12.5 MG PO CAPS: 12.5000 mg | ORAL_CAPSULE | Freq: Every day | ORAL | Status: DC

## 2015-11-30 MED ORDER — AMLODIPINE BESYLATE 10 MG PO TABS
10.0000 mg | ORAL_TABLET | Freq: Every day | ORAL | Status: DC
  Administered 2015-11-30: 10 mg via ORAL
  Filled 2015-11-30: qty 1

## 2015-11-30 MED ORDER — ASPIRIN 81 MG PO TBEC: 81.0000 mg | DELAYED_RELEASE_TABLET | Freq: Every day | ORAL | Status: DC

## 2015-11-30 NOTE — Progress Notes (Signed)
Pt being discharged per orders from MD. Pt and family educated on discharge instructions. Pt and family verbalized understanding of instructions. All questions and concerns were addressed. Pt's IV was removed before discharge. Pt exited hospital via wheelchair. Pt did not receive Cane while in hospital before discharge. Pt made aware and verbalized understanding. Pt's information was taken and given to charge nurse to have equipment delivered to home.

## 2015-11-30 NOTE — Telephone Encounter (Signed)
   Reason for call:   I received a call around 9pm indicating patient [facility].   Assessment / Plan / Recommendations:   Pres[facility] [address] for HCTZ, lisinopril, atorvastatin, aspirin, amlodipine.   Beather Arbourushil V Patel, MD   11/30/2015, 9:15 PM

## 2015-11-30 NOTE — Progress Notes (Signed)
STROKE TEAM PROGRESS NOTE   HISTORY OF PRESENT ILLNESS (per record) Eric Patterson is an 50 y.o. male with known HTN, smokes and drinks excessive ETOH. He ran out of one of his HTN medications yesterday but does not have a PCP to fill his Rx. He went to sleep at 2000 hours and felt normal. Awoke this AM and felt his left leg was weak and he had ortho stasis. The reason he came to ED was due to his orthostasis. MRI brain was obtained and confirmed a right small vessel stroke. He admits to not taking ASA daily.   Date last known well: Yesterday Time last known well: Time: 20:00 tPA Given: No: out of window   SUBJECTIVE (INTERVAL HISTORY) His wife was at the beside.  Overall he feels his condition is gradually improving. We reviewed MRI, labs and risk factors together.  ROS was only positive for "leg still weak."   OBJECTIVE Temp:  [97.5 F (36.4 C)-98.1 F (36.7 C)] 97.7 F (36.5 C) (07/16 1144) Pulse Rate:  [53-72] 61 (07/16 1144) Cardiac Rhythm:  [-] Sinus bradycardia (07/16 0716) Resp:  [16-20] 16 (07/16 1144) BP: (163-210)/(87-125) 194/87 mmHg (07/16 1144) SpO2:  [97 %-100 %] 100 % (07/16 1144)  CBC:   Recent Labs Lab 11/28/15 1010  WBC 5.4  NEUTROABS 3.5  HGB 15.2  HCT 44.0  MCV 89.1  PLT 284    Basic Metabolic Panel:   Recent Labs Lab 11/28/15 1010  NA 138  K 4.3  CL 108  CO2 22  GLUCOSE 95  BUN 13  CREATININE 1.09  CALCIUM 9.0    Lipid Panel:     Component Value Date/Time   CHOL 161 11/29/2015 0233   TRIG 160* 11/29/2015 0233   HDL 34* 11/29/2015 0233   CHOLHDL 4.7 11/29/2015 0233   VLDL 32 11/29/2015 0233   LDLCALC 95 11/29/2015 0233   HgbA1c:  Lab Results  Component Value Date   HGBA1C 5.3 11/28/2015   Urine Drug Screen:     Component Value Date/Time   LABOPIA NONE DETECTED 11/29/2015 2045   COCAINSCRNUR NONE DETECTED 11/29/2015 2045   LABBENZ NONE DETECTED 11/29/2015 2045   AMPHETMU NONE DETECTED 11/29/2015 2045   THCU NONE DETECTED  11/29/2015 2045   LABBARB NONE DETECTED 11/29/2015 2045      IMAGING  Mr Brain Wo Contrast 11/28/2015   Acute infarct in the right posterior corona radiata and posterior external capsule. Minimal chronic microvascular ischemic change in the white matter. Otherwise negative Mucosal edema paranasal sinuses.   Mr Maxine GlennMra Head/brain Wo Cm 11/28/2015   1. Asymmetric caliber of the distal internal carotid arteries bilaterally suggesting a more proximal stenosis on the left.  2. Moderate narrowing of M2 segments on the left beyond a proximal bifurcation.  3. Moderate distal segmental stenoses of MCA and ACA branch vessels, left greater than right.  4. Fetal type posterior cerebral arteries with small P1 segments bilaterally.   CTA Head and Neck 11/29/2015  CTA Head  1. Evolving ischemic infarct within the right external capsule/corona radiata, stable from previous MRI. No associated hemorrhage or mass effect. 2. Negative for large vessel occlusion. 3. Moderate atheromatous narrowing (approximately 50%) of the cavernous/supraclinoid left ICA. This is superimposed on an asymmetrically small left ICA, suspected to be at least in part due to the hypoplastic/absent left A1 segment. 4. Moderate atheromatous irregularity with stenoses involving the MCA branches bilaterally, left greater than right. 5. Fetal type PCAs with diminutive vertebrobasilar system.  CTA  Neck  1. Atheromatous plaque at the proximal left ICA with associated short segment stenosis of up to 30% by NASCET criteria. Left ICA diffusely small in caliber as compared to the right. 2. Calcified and noncalcified atheromatous plaque at the right carotid bifurcation/proximal right ICA without flow-limiting stenosis. 3. Patent vertebral arteries within the neck.    Carotid Ultrasound 11/29/2015 40-59% distal right ICA stenosis, highest end of scale. 1-39% left ICA plaquing. Bilateral vertebral artery flow is antegrade.    PHYSICAL  EXAM HEENT- Normocephalic, no lesions, without obvious abnormality. Normal external eye and conjunctiva.  Cardiovascular- S1, S2 normal, pulses palpable throughout  Lungs- chest clear, no wheezing, rales, normal symmetric air entry Abdomen- normal findings: bowel sounds normal Extremities- no edema Lymph-no adenopathy palpable Musculoskeletal-no joint tenderness, deformity or swelling Skin-warm and dry, no hyperpigmentation, vitiligo, or suspicious lesions  Neurological Examination Mental Status: Alert, oriented, thought content appropriate. Speech fluent without evidence of aphasia. Able to follow 3 step commands without difficulty. Cranial Nerves: II: Discs flat bilaterally; Visual fields grossly normal, pupils equal, round, reactive to light and accommodation III,IV, VI: ptosis not present, extra-ocular motions intact bilaterally V,VII: smile symmetric, facial light touch sensation normal bilaterally VIII: hearing normal bilaterally IX,X: uvula rises symmetrically XI: bilateral shoulder shrug XII: midline tongue extension Motor: Right :Upper extremity 5/5Left: Upper extremity 5/5 Lower extremity 5/5Lower extremity 4+/5  Sensory: light touch intact throughout, bilaterally  Cerebellar: normal finger-to-nose, normal rapid alternating movements and normal heel-to-shin test on right not left  Gait: not tested   ASSESSMENT/PLAN [name] is a 50 y.o. male with history of hypertension, alcohol abuse, medical noncompliance, and ongoing tobacco use presenting with left leg weakness and orthostasis. He did not receive IV t-PA due to late presentation.  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  Resultant  Mild weakness on left  MRI - acute infarct in the right posterior corona radiata and posterior  external capsule.   MRA - bilateral mild to moderate disease as noted above.  Carotid Doppler -  40-59% distal right ICA stenosis, highest end of scale. 1-39% left ICA plaquing. Bilateral vertebral artery flow is antegrade.   CTA Head & Neck - no high-grade stenosis; Moderate atheromatous narrowing (approximately 50%) of the cavernous/supraclinoid left ICA.  2D Echo - EF 60-65%. No cardiac source of emboli identified.  LDL - 95  HgbA1c 5.3  VTE prophylaxis - Lovenox Diet Heart Room service appropriate?: Yes; Fluid consistency:: Thin Diet - low sodium heart healthy  No antithrombotic prior to admission, now on aspirin 81 mg daily  Patient counseled to be compliant with his antithrombotic medications  Ongoing aggressive stroke risk factor management  Therapy recommendations: Outpatient physical therapy recommended  Disposition:  Pending  Hypertension  Stable  Permissive hypertension (OK if < 220/120) but gradually normalize in 5-7 days  Long-term BP goal normotensive  Hyperlipidemia  Home meds: No lipid lowering medications prior to admission  LDL 95, goal < 70  Now on Lipitor 80 mg daily  Continue statin at discharge  Other Stroke Risk Factors  Cigarette smoker - advised to stop smoking  ETOH use, advised to drink no more than 1 - 2 drink(s) a day  Family hx stroke (mother and brother)   Other Active Problems   ATTENDING NOTE: Patient was seen and examined by me personally. Documentation reflects findings. The laboratory and radiographic studies reviewed by me. ROS completed by me personally and pertinent positives fully documented  Condition: stable  Assessment and plan completed by me personally and fully documented above. Plans/Recommendations include:     Reviewed CT angiogram of head and neck.  No further management needed at this time  Consider hypercoagulable workup; can be done as outpatient  Consider urine drug screen ->  negative  Follow-up Dr. Pearlean Brownie in 2 months  SIGNED BY: Dr. Alesia Banda day # 2     To contact Stroke Continuity provider, please refer to [email or web address]. After hours, contact General Neurology

## 2015-11-30 NOTE — Progress Notes (Signed)
   Subjective: Feeling better this morning. Able to ambulate. Tolerating PO intake  Objective: Vital signs in last 24 hours: Filed Vitals:   11/29/15 2015 11/29/15 2016 11/30/15 0131 11/30/15 0603  BP: 210/112 210/125 185/104 207/110  Pulse: 53 72 55 60  Temp: 98.1 F (36.7 C)  97.7 F (36.5 C) 97.5 F (36.4 C)  TempSrc:   Oral Oral  Resp: 18  18 20   Height:      Weight:      SpO2: 100%  97% 98%   Physical Exam General Apperance: NAD HEENT: Normocephalic, atraumatic, anicteric sclera Neck: Supple, trachea midline Lungs: Clear to auscultation bilaterally. No wheezes, rhonchi or rales. Breathing comfortably Heart: Regular rate and rhythm, no murmur/rub/gallop Abdomen: Soft, nontender, nondistended, no rebound/guarding Extremities: Warm and well perfused, no edema Skin: No rashes or lesions Neurologic: Alert and interactive. No gross deficits.   Assessment/Plan: 50 year old man with history of HTN and alcohol abuse presented with dizziness and unsteady gait found to have acute right internal capsule and corona radiata CVA.  CVA: Prelim carotid duplex with 40-59% distal right ICA stenosis, highest end of scale. 1-39% left ICA plaquing. CTA head and neck with atheromatous plaque at the proximal left ICA with associated short segment stenosis of up to 30%. Calcified and noncalcified atheromatous plaque at the right carotid bifurcation/proximal right ICA without flow-limiting stenosis. Moderate atheromatous narrowing (approximately 50%) of the cavernous/supraclinoid left ICA. Moderate atheromatous irregularity with stenoses involving the MCA branches bilaterally, left greater than right. Echo with EF 60-65% and grade 1 diastolic dysfunction. -Neurology following, appreciate recommendations -ASA 81mg  daily -Lipitor 80mg  daily -Hgb A1c pending -Outpatient PT recommended  HTN: Restart home amlodipine and HCTZ. Holding home lisinopril.  Alcohol abuse: CIWA monitoring  Dispo:  Likely home today   LOS: 2 days   Lora PaulaJennifer T Angas Isabell, MD 11/30/2015, 8:10 AM Pager: [phone]

## 2015-11-30 NOTE — Progress Notes (Signed)
Physical Therapy Treatment Patient Details Name: Eric Patterson MRN: [id number] DOB: 04/26/1966 Today's Date: 11/30/2015    History of Present Illness 50 yo M with a pmhx of HTN and alcohol use disorder who presented to the ED after having difficulty walking at home. MRI was consistent with an acute, right small vessel stroke.     PT Comments    Patient progressing with ambulation with gait and stairs this session.  Remains with decreased coordination, sequencing, timing and motor activation on L side so continue to feel he is appropropriate for outpatient skilled PT at d/c.  Follow Up Recommendations  Outpatient PT;Supervision - Intermittent     Equipment Recommendations  Cane    Recommendations for Other Services       Precautions / Restrictions Precautions Precautions: Fall Restrictions Weight Bearing Restrictions: No    Mobility  Bed Mobility Overal bed mobility: Modified Independent                Transfers   Equipment used: None Transfers: Sit to/from Stand Sit to Stand: Supervision         General transfer comment: assist for safety, mild imbalance  Ambulation/Gait   Ambulation Distance (Feet): 250 Feet Assistive device: None;Straight cane Gait Pattern/deviations: Step-to pattern;Step-through pattern;Decreased step length - right;Decreased stance time - left;Wide base of support     General Gait Details: for balance and cues for L knee flexion and increased R step length; initially working to keep L arm extended and cues for focus on LE stability   Stairs Stairs: Yes Stairs assistance: Min guard Stair Management: One rail Right;Step to pattern;Forwards;With cane Number of Stairs: 10 General stair comments: cues for L hand placement with descending steps  Wheelchair Mobility    Modified Rankin (Stroke Patients Only) Modified Rankin (Stroke Patients Only) Pre-Morbid Rankin Score: No symptoms Modified Rankin: Moderately severe disability     Balance Overall balance assessment: Needs assistance Sitting-balance support: Single extremity supported Sitting balance-Leahy Scale: Good     Standing balance support: No upper extremity supported Standing balance-Leahy Scale: Fair                      Cognition Arousal/Alertness: Awake/alert Behavior During Therapy: WFL for tasks assessed/performed Overall Cognitive Status: Within Functional Limits for tasks assessed                      Exercises      General Comments General comments (skin integrity, edema, etc.): Reviewed education about follow up outpatient PT and need to use cane and have assist for safety on stairs      Pertinent Vitals/Pain Pain Assessment: No/denies pain    Home Living                      Prior Function            PT Goals (current goals can now be found in the care plan section) Progress towards PT goals: Progressing toward goals    Frequency       PT Plan Current plan remains appropriate    Co-evaluation             End of Session Equipment Utilized During Treatment: Gait belt Activity Tolerance: Patient tolerated treatment well Patient left: in bed;with call bell/phone within reach;with family/visitor present     Time: 1410-1438 PT Time Calculation (min) (ACUTE ONLY): 28 min  Charges:  $Gait Training: 23-37 mins  G Codes:      Elray McgregorCynthia Favian Kittleson 11/30/2015, 3:50 PM  Sheran Lawlessyndi Sarahlynn Cisnero, [place]PT [phone] 11/30/2015

## 2015-11-30 NOTE — Progress Notes (Signed)
Cm received call from RN for DME.  CM called [facility] DME rep, Germaine to please deliver the 3n1 and cane to room so pt can discharge.  No other CM needs wee communicated.

## 2015-12-02 LAB — VAS US CAROTID
LCCADDIAS: -19 cm/s
LCCADSYS: -61 cm/s
LCCAPDIAS: 24 cm/s
LCCAPSYS: 117 cm/s
LEFT ECA DIAS: -11 cm/s
LEFT VERTEBRAL DIAS: -14 cm/s
LICADDIAS: -26 cm/s
LICADSYS: -64 cm/s
Left ICA prox dias: -14 cm/s
Left ICA prox sys: -36 cm/s
RCCADSYS: -153 cm/s
RIGHT ECA DIAS: -14 cm/s
RIGHT VERTEBRAL DIAS: -14 cm/s
Right CCA prox dias: 29 cm/s
Right CCA prox sys: 95 cm/s

## 2015-12-08 ENCOUNTER — Ambulatory Visit (INDEPENDENT_AMBULATORY_CARE_PROVIDER_SITE_OTHER): Payer: 59 | Admitting: Internal Medicine

## 2015-12-08 ENCOUNTER — Encounter: Payer: Self-pay | Admitting: Internal Medicine

## 2015-12-08 VITALS — BP 148/93 | HR 70 | Temp 98.0°F | Ht 66.0 in | Wt 170.1 lb

## 2015-12-08 DIAGNOSIS — I63131 Cerebral infarction due to embolism of right carotid artery: Secondary | ICD-10-CM | POA: Diagnosis not present

## 2015-12-08 DIAGNOSIS — I10 Essential (primary) hypertension: Secondary | ICD-10-CM | POA: Diagnosis not present

## 2015-12-08 MED ORDER — TELMISARTAN-HCTZ 80-25 MG PO TABS: 1.0000 | ORAL_TABLET | Freq: Every day | ORAL | Status: DC

## 2015-12-08 NOTE — Progress Notes (Signed)
   CC: stroke  HPI:  Mr.Eric Patterson is a 50 y.o. male who presents today with his wife for stroke. Please see assessment & plan for status of chronic medical problems.   Past Medical History:  Diagnosis Date  . Hypertension     Review of Systems: Please see each problem below for a pertinent review of systems.  Physical Exam:  Vitals:   12/08/15 1039  BP: (!) 148/93  Pulse: 70  Temp: 98 F (36.7 C)  TempSrc: Oral  SpO2: 100%  Weight: 170 lb 1.6 oz (77.2 kg)  Height: 5\' 6"  (1.676 m)   Constitutional: Middle-aged African-American male. No distress.  Head: Normocephalic and atraumatic.  Cardiovascular: Normal rate, regular rhythm and normal heart sounds.  No gallop, friction rub, murmur heard. Pulmonary/Chest: Effort normal. No respiratory distress. No wheezes, rales.  Abdominal: Soft. Bowel sounds are normal. No distension. No tenderness.  Neurological: Alert and oriented to person, place, and time. Coordination normal.  2+ patellar reflex on right though 3+ on the left. 5 out of 5 right and left lower extremity strength. Skin: Warm and dry. Not diaphoretic.    Assessment & Plan:   See encounters tab for problem based medical decision making.   Patient discussed with Dr. Rogelia Boga

## 2015-12-08 NOTE — Progress Notes (Signed)
Internal Medicine Clinic Attending  Case discussed with Dr. Patel,Rushil at the time of the visit.  We reviewed the resident's history and exam and pertinent patient test results.  I agree with the assessment, diagnosis, and plan of care documented in the resident's note.  

## 2015-12-08 NOTE — Progress Notes (Deleted)
   Subjective:    Patient ID: Eric Patterson, male    DOB: Sep 29, 1965, 50 y.o.   MRN: [id number]  HPI Eric Patterson is a 50 year old male who presents today for establishing care with our clinic. Please see assessment & plan for status of chronic medical problems.  Brain MRI 11/28/15 notable for acute infarction in the right posterior corona radiata and posterior external capsule.  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  He has follow-up with neurology on 01/26/16.  His blood pressure today is 148/93, improved from 196/93 at discharge on 11/30/15. His home medications include HCTZ 12.5 mg daily, lisinopril 20 mg daily, amlodipine 10 mg daily.    No past surgical history on file. Family History  Problem Relation Age of Onset  . Heart attack Mother   . Stroke Brother 65  . Stroke Other    Social History   Social History  . Marital status: Married    Spouse name: N/A  . Number of children: N/A  . Years of education: N/A   Occupational History  . Not on file.   Social History Main Topics  . Smoking status: Current Every Day Smoker    Packs/day: 0.50    Types: Cigarettes  . Smokeless tobacco: Not on file  . Alcohol use Yes  . Drug use: No  . Sexual activity: Not on file   Other Topics Concern  . Not on file   Social History Narrative  . No narrative on file    Review of Systems Please see each problem below for a pertinent review of systems.     Objective:   Physical Exam        Assessment & Plan:

## 2015-12-08 NOTE — Assessment & Plan Note (Addendum)
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 He is also ceased his alcohol intake which he quantified as 12 ounce cans of beer 6 and 10 ounce of liquor each day. He denies dizziness, blurry vision, weakness, headache, chest pain, difficulty breathing. He acknowledges adherence to aspirin 81 mg daily and atorvastatin 80 mg daily. He and his wife are wondering when he'll be referred for physical therapy, possibility of receiving a new shower chair since the one they were given in the hospital was to bake for him, and a new cane.  Physical exam findings consistent with prior neurologic lesion without report of additional focal deficits.   Plan -Counseled him that his highest risk factor for a new stroke is a prior stroke though he is making good progress with his current lifestyle modification. -Refer for neuro rehabilitation -Ordered new shower chair though explained to him that should the insurance cover his cane and will factor into future expenses he may incur as a result of unforeseen illness or complication to which he expressed preference for purchasing a cane at a local pharmacy

## 2015-12-08 NOTE — Patient Instructions (Signed)
We have referred you to rehab, so you will get a call sometime this week to schedule additional sessions.  For the shower chair, you will get a call to confirm what exactly we have.  Keep up the good work with the alcohol and cigarettes!

## 2015-12-08 NOTE — Assessment & Plan Note (Addendum)
Assessment His blood pressure today is 148/93, improved from 196/93 at discharge on 11/30/15. He is followed up with his PCP Dr. Santo Held who simplified his discharge regimen [HCTZ 12.5 mg daily, lisinopril 20 mg daily, amlodipine 10 mg daily] to telmisartan/HCTZ 80/25 mg daily, amlodipine 10 mg daily. He does not report signs of hypertensive urgency, like headache, blurry vision, chest pain, difficulty breathing  Plan -Continue telmisartan/HCTZ 80/25 mg daily -Continue amlodipine 10 mg daily -Counseled the patient and his wife that optimal blood pressure control should be <140/90 and would defer changes to PCP

## 2015-12-12 ENCOUNTER — Encounter: Payer: Self-pay | Admitting: Rehabilitation

## 2015-12-12 ENCOUNTER — Ambulatory Visit: Payer: 59 | Attending: Internal Medicine | Admitting: Rehabilitation

## 2015-12-12 DIAGNOSIS — R2681 Unsteadiness on feet: Secondary | ICD-10-CM | POA: Diagnosis present

## 2015-12-12 DIAGNOSIS — I69354 Hemiplegia and hemiparesis following cerebral infarction affecting left non-dominant side: Secondary | ICD-10-CM | POA: Diagnosis present

## 2015-12-12 DIAGNOSIS — R2689 Other abnormalities of gait and mobility: Secondary | ICD-10-CM | POA: Diagnosis present

## 2015-12-12 NOTE — Therapy (Signed)
[facility] [address] [place], [place], [address] Phone: [phone]   Fax:  [phone]  Physical Therapy Evaluation  Patient Details  Name: Eric Patterson MRN: [id number] Date of Birth: [birth year] Referring Provider: Blanch Media, MD  Encounter Date: 12/12/2015      PT End of Session - 12/12/15 1254    Visit Number 1   Number of Visits 7   Date for PT Re-Evaluation 01/26/16   Authorization Type [employer]   PT Start Time 0802   PT Stop Time 0845   PT Time Calculation (min) 43 min   Activity Tolerance Patient tolerated treatment well   Behavior During Therapy [facility] for tasks assessed/performed      Past Medical History:  Diagnosis Date  . Hypertension     History reviewed. No pertinent surgical history.  There were no vitals filed for this visit.       Subjective Assessment - 12/12/15 0805    Subjective "This leg right here (Left leg) is why I'm here.  I want to get balanced, that's why I'm using the cane."    Patient is accompained by: Family member  Eric Patterson   Limitations Walking;House hold activities   Patient Stated Goals "I want to walk without the cane."    Currently in Pain? No/denies            [facility] PT Assessment - 12/12/15 0001      Assessment   Medical Diagnosis RCVA   Referring Provider Blanch Media, MD   Onset Date/Surgical Date 11/28/15   Hand Dominance Right     Precautions   Precautions Fall     Restrictions   Weight Bearing Restrictions No     Balance Screen   Has the patient fallen in the past 6 months No     Home Environment   Living Environment Private residence   Living Arrangements Spouse/significant other   Available Help at Discharge Family;Available 24 hours/day   Type of Home Mobile home   Home Access Stairs to enter   Entrance Stairs-Number of Steps 7 then small one   Entrance Stairs-Rails Right   Home Layout One level   Home Equipment [place] - single  point;Shower seat  shower chair doesn't fit, tub/shower     Prior Function   Level of Independence Independent   Vocation Full time employment   Vocation Requirements build molds and maintence    Leisure Work on his truck     Cognition   Overall Cognitive Status Within Functional Limits for tasks assessed     Sensation   Light Touch Appears Intact   Hot/Cold Appears Intact   Proprioception Appears Intact     Coordination   Gross Motor Movements are Fluid and Coordinated Yes   Fine Motor Movements are Fluid and Coordinated Yes  somewhat decreased fluidity due to weakness     ROM / Strength   AROM / PROM / Strength Strength     Strength   Overall Strength Deficits   Overall Strength Comments RLE WFL, L hip flex 3/5, all others grossly 4/5     Transfers   Transfers Sit to Stand;Stand to Sit   Sit to Stand 5: Supervision  only for cues for equal WB   Stand to Sit 6: Modified independent (Device/Increase time)     Ambulation/Gait   Ambulation/Gait Yes   Ambulation/Gait Assistance 5: Supervision   Ambulation/Gait Assistance Details Cues for increaesd time spent on LLE during gait.  Ambulation Distance (Feet) 345 Feet   Assistive device Straight cane;None   Gait Pattern Step-through pattern;Decreased step length - right;Decreased stride length;Decreased weight shift to left;Trendelenburg;Lateral hip instability   Ambulation Surface Level;Indoor   Gait velocity 2.51 ft/sec w/ cane, 2.73 ft/sec without cane   Stairs Yes   Stairs Assistance 6: Modified independent (Device/Increase time)   Stair Management Technique Two rails;Step to pattern;Forwards   Number of Stairs 4   Height of Stairs 6     Functional Gait  Assessment   Gait assessed  Yes   Gait Level Surface Walks 20 ft in less than 7 sec but greater than 5.5 sec, uses assistive device, slower speed, mild gait deviations, or deviates 6-10 in outside of the 12 in walkway width.   Change in Gait Speed Able to change  speed, demonstrates mild gait deviations, deviates 6-10 in outside of the 12 in walkway width, or no gait deviations, unable to achieve a major change in velocity, or uses a change in velocity, or uses an assistive device.   Gait with Horizontal Head Turns Performs head turns smoothly with slight change in gait velocity (eg, minor disruption to smooth gait path), deviates 6-10 in outside 12 in walkway width, or uses an assistive device.   Gait with Vertical Head Turns Performs task with moderate change in gait velocity, slows down, deviates 10-15 in outside 12 in walkway width but recovers, can continue to walk.   Gait and Pivot Turn Pivot turns safely in greater than 3 sec and stops with no loss of balance, or pivot turns safely within 3 sec and stops with mild imbalance, requires small steps to catch balance.   Step Over Obstacle Is able to step over one shoe box (4.5 in total height) but must slow down and adjust steps to clear box safely. May require verbal cueing.   Gait with Narrow Base of Support Ambulates 4-7 steps.   Gait with Eyes Closed Walks 20 ft, slow speed, abnormal gait pattern, evidence for imbalance, deviates 10-15 in outside 12 in walkway width. Requires more than 9 sec to ambulate 20 ft.   Ambulating Backwards Walks 20 ft, uses assistive device, slower speed, mild gait deviations, deviates 6-10 in outside 12 in walkway width.   Steps Two feet to a stair, must use rail.   Total Score 15   FGA comment: < 19 = high risk fall                           PT Education - 12/12/15 1246    Education provided Yes   Education Details Evaluation findings, HEP, POC, goals   Person(s) Educated Patient;Spouse   Methods Explanation;Handout   Comprehension Verbalized understanding          PT Short Term Goals - 12/12/15 1300      PT SHORT TERM GOAL #1   Title Pt will be independent with HEP in order to indicate improved functional mobility.  (Target Date: 01/02/16)    Status New     PT SHORT TERM GOAL #2   Title Pt will be able to verbalize warning signs and risk factors for CVA in order to reduce time to seeking medical attention in case of another CVA.     Status New     PT SHORT TERM GOAL #3   Title Pt will ambulate up to 300' without AD over varying indoor surfaces without AD without gait deviations to indicate increased  independence with gait in home.             PT Long Term Goals - 12/12/15 1304      PT LONG TERM GOAL #1   Title Pt will improve FGA score to 22/30 in order to indicate decreased fall risk.  (Target Date: 01/23/16)   Status New     PT LONG TERM GOAL #2   Title Pt will ambulate up to 1000' over varying outdoor surfaces (grass, paved, curb, etc) without AD at mod I level in order to indicate safety in community and return to work.     Status New     PT LONG TERM GOAL #3   Title Pt will be able to simulate work activities (climbing, getting onto floor and back up, etc) at independent level in order to demonstrate safe return to work.     Status New     PT LONG TERM GOAL #4   Title Pt will ambulate with gait speed >2.62 ft/sec without device with decreased gait deviations in order to indicate improved efficiency of gait.    Status New               Plan - 12/12/15 1255    Clinical Impression Statement Pt presents s/p R CVA on 11/28/15 with L hemiplegia (LE>UE).  Note decreased functional strength, decreased balance, and decreased endurance.  Pt with history of HTN, alcohol and tobacco abuse that could all impact progress in therapy and elevate risk for having another CVA.  Upon PT evaluation, note decreased functional strength in LLE with compensatory gait patterns, gait speed of 2.51 ft/sec with use of cane, 2.73 ft/sec without use of cane but with compensations, and FGA score of 15/30, indicative of elevated fall risk.  Pts job is very physical and will need to be high level prior to return.  Pt is of evolving presentation  and moderate complexity from PT POC standpoint.  Pt will benefit from skilled OP neuro PT in order to address deficits.     Rehab Potential Excellent   Clinical Impairments Affecting Rehab Potential HTN, hx of tobacco and ETOH abuse   PT Frequency 1x / week   PT Duration 6 weeks   PT Treatment/Interventions ADLs/Self Care Home Management;Gait training;Stair training;Functional mobility training;Therapeutic activities;Therapeutic exercise;Balance training;Neuromuscular re-education;Patient/family education;Energy conservation   PT Next Visit Plan check compliance with current HEP, add hip flex exercise.  Gait training without device looking at improve gait mechanics (loading LLE during stance).     PT Home Exercise Plan see pt instruction.     Consulted and Agree with Plan of Care Patient;Family member/caregiver   Family Member Consulted Wife      Patient will benefit from skilled therapeutic intervention in order to improve the following deficits and impairments:  Abnormal gait, Decreased activity tolerance, Decreased balance, Decreased endurance, Decreased mobility, Decreased safety awareness, Decreased strength, Impaired perceived functional ability, Postural dysfunction  Visit Diagnosis: Unsteadiness on feet - Plan: PT plan of care cert/re-cert  Hemiplegia and hemiparesis following cerebral infarction affecting left non-dominant side (HCC) - Plan: PT plan of care cert/re-cert  Other abnormalities of gait and mobility - Plan: PT plan of care cert/re-cert     Problem List Patient Active Problem List   Diagnosis Date Noted  . Hypertension 11/28/2015  . CVA (cerebral infarction) 11/28/2015   [name] Butte, PT, MPT [facility] [address] [place], [place], [address] Phone: [phone]   Fax:  [phone] 12/12/15,  1:14 PM  Name: Eric Patterson MRN: [id number] Date of Birth: June 07, 1965

## 2015-12-12 NOTE — Patient Instructions (Addendum)
Bridging (Single Leg)    Lie on back with feet shoulder width apart and right leg straight. Lift hips toward the ceiling while keeping leg straight. Hold _5___ seconds.  Lie on couch, if holding R leg up is too difficult, then place R leg over top part of couch so that you don't use it and only use Left leg to lift hips.  Repeat _10___ times. Do __2-3__ sessions per day.  http://gt2.exer.us/358   Copyright  VHI. All rights reserved.   SINGLE LIMB STANCE    Stance: single leg on floor. Raise leg. Hold __20_ seconds. Repeat with other leg. __3_ reps per set, _2-3__ sets per day, _5-7__ days per week  Copyright  VHI. All rights reserved.       Start on floor on both of your knees with L leg close to couch.  Transition into position pictured above with RLE moving up and down.  Repeat x 10 reps and perform 2-3 times a day.  Think about staying tall, using UE support as little as you can and keeping L hip tucked!

## 2015-12-18 ENCOUNTER — Ambulatory Visit: Payer: 59 | Attending: Internal Medicine | Admitting: Physical Therapy

## 2015-12-18 DIAGNOSIS — I69354 Hemiplegia and hemiparesis following cerebral infarction affecting left non-dominant side: Secondary | ICD-10-CM | POA: Insufficient documentation

## 2015-12-18 DIAGNOSIS — R2689 Other abnormalities of gait and mobility: Secondary | ICD-10-CM | POA: Insufficient documentation

## 2015-12-18 DIAGNOSIS — R2681 Unsteadiness on feet: Secondary | ICD-10-CM | POA: Insufficient documentation

## 2015-12-22 ENCOUNTER — Ambulatory Visit: Payer: 59 | Admitting: Rehabilitation

## 2015-12-22 ENCOUNTER — Encounter: Payer: Self-pay | Admitting: Rehabilitation

## 2015-12-22 DIAGNOSIS — R2689 Other abnormalities of gait and mobility: Secondary | ICD-10-CM

## 2015-12-22 DIAGNOSIS — I69354 Hemiplegia and hemiparesis following cerebral infarction affecting left non-dominant side: Secondary | ICD-10-CM | POA: Diagnosis present

## 2015-12-22 DIAGNOSIS — R2681 Unsteadiness on feet: Secondary | ICD-10-CM | POA: Diagnosis not present

## 2015-12-22 NOTE — Therapy (Signed)
[facility] [address] [place], [place], [address] Phone: [phone]   Fax:  [phone]  Physical Therapy Treatment  Patient Details  Name: Eric Patterson MRN: [id number] Date of Birth: 08/06/1965 Referring Provider: Blanch MediaElizabeth Butcher, MD  Encounter Date: 12/22/2015      PT End of Session - 12/22/15 1323    Visit Number 2   Number of Visits 7   Date for PT Re-Evaluation 01/26/16   Authorization Type [employer]   PT Start Time 1316   PT Stop Time 1402   PT Time Calculation (min) 46 min   Activity Tolerance Patient tolerated treatment well   Behavior During Therapy [facility] for tasks assessed/performed      Past Medical History:  Diagnosis Date  . Hypertension     History reviewed. No pertinent surgical history.  There were no vitals filed for this visit.      Subjective Assessment - 12/22/15 1322    Subjective Reports no changes, some pain in left leg following exercises.    Currently in Pain? No/denies              NMR:  Had pt perform ambulation x 345' during session in order to focus on LLE NMR for increased time spent on LLE during stance phase of gait.  Had pt slow gait speed down in order to put more focus on increased L hip protraction, however noted that he tended to increase L step length and continue to take shorter step on RLE, therefore had pt ambulate for 3 mins on gait trainer at 2.0 mph for improved visual of targets.  Note that he still spends more time on RLE, however improved (54% of time spent on RLE).  Ambulated another 44115' with noted improvement in gait mechanics and posture with only light tactile cues needed for increased L hip protraction.  Progressed to performing step ups in // bars with LLE planted on 4" step, advancing RLE to chair seat and back down with single UE support x 5 reps>no UE support x 7 reps with facilitation for increased L glute med activation.  Increased challenge of  exercise by adding foam airdex pad to small step and styrofoam cup to chair to tap to lightly to increase WB on LLE and balance challenge.  Performed with single UE support x 5 reps and without UE support x 8 reps.  Performed cone tapping with RLE x 3 cones x 4 reps, keeping RLE on cones, until he had tapped all three.  Tolerated all very well.  Also addressed getting into/out of tub safely without use of shower chair.  Had pt perform side step into and out of while placing hand on wall for light support with cues for providing enough space once stepped one leg to have room for the other leg.  Pt returned demonstration during session.  Wife present to observe.                     PT Education - 12/22/15 1322    Education provided Yes   Education Details importance of increasing time spent on LLE   Person(s) Educated Patient   Methods Explanation   Comprehension Verbalized understanding          PT Short Term Goals - 12/12/15 1300      PT SHORT TERM GOAL #1   Title Pt will be independent with HEP in order to indicate improved functional mobility.  (Target Date: 01/02/16)  Status New     PT SHORT TERM GOAL #2   Title Pt will be able to verbalize warning signs and risk factors for CVA in order to reduce time to seeking medical attention in case of another CVA.     Status New     PT SHORT TERM GOAL #3   Title Pt will ambulate up to 300' without AD over varying indoor surfaces without AD without gait deviations to indicate increased independence with gait in home.             PT Long Term Goals - 12/12/15 1304      PT LONG TERM GOAL #1   Title Pt will improve FGA score to 22/30 in order to indicate decreased fall risk.  (Target Date: 01/23/16)   Status New     PT LONG TERM GOAL #2   Title Pt will ambulate up to 1000' over varying outdoor surfaces (grass, paved, curb, etc) without AD at mod I level in order to indicate safety in community and return to work.     Status  New     PT LONG TERM GOAL #3   Title Pt will be able to simulate work activities (climbing, getting onto floor and back up, etc) at independent level in order to demonstrate safe return to work.     Status New     PT LONG TERM GOAL #4   Title Pt will ambulate with gait speed >2.62 ft/sec without device with decreased gait deviations in order to indicate improved efficiency of gait.    Status New               Plan - 12/22/15 1323    Clinical Impression Statement Skilled session focused on gait training and exercises for NMR in LLE.  Pt tolerated well and note marked improvement within session.     Rehab Potential Excellent   Clinical Impairments Affecting Rehab Potential HTN, hx of tobacco and ETOH abuse   PT Frequency 1x / week   PT Duration 6 weeks   PT Treatment/Interventions ADLs/Self Care Home Management;Gait training;Stair training;Functional mobility training;Therapeutic activities;Therapeutic exercise;Balance training;Neuromuscular re-education;Patient/family education;Energy conservation   PT Next Visit Plan check compliance with current HEP, add hip flex exercise.  Gait training without device looking at improve gait mechanics (loading LLE during stance).     PT Home Exercise Plan see pt instruction.     Consulted and Agree with Plan of Care Patient;Family member/caregiver   Family Member Consulted Wife      Patient will benefit from skilled therapeutic intervention in order to improve the following deficits and impairments:  Abnormal gait, Decreased activity tolerance, Decreased balance, Decreased endurance, Decreased mobility, Decreased safety awareness, Decreased strength, Impaired perceived functional ability, Postural dysfunction  Visit Diagnosis: Unsteadiness on feet  Hemiplegia and hemiparesis following cerebral infarction affecting left non-dominant side (HCC)  Other abnormalities of gait and mobility     Problem List Patient Active Problem List    Diagnosis Date Noted  . Hypertension 11/28/2015  . CVA (cerebral infarction) 11/28/2015    [name] Butte, PT, MPT [facility] [address] [place], [place], [address] Phone: [phone]   Fax:  [phone] 12/22/15, 4:25 PM  Name: Eric Patterson MRN: [id number] Date of Birth: 1965-10-13

## 2015-12-29 ENCOUNTER — Ambulatory Visit: Payer: 59 | Admitting: Rehabilitation

## 2015-12-29 DIAGNOSIS — R2681 Unsteadiness on feet: Secondary | ICD-10-CM | POA: Diagnosis not present

## 2015-12-29 DIAGNOSIS — R2689 Other abnormalities of gait and mobility: Secondary | ICD-10-CM

## 2015-12-29 DIAGNOSIS — I69354 Hemiplegia and hemiparesis following cerebral infarction affecting left non-dominant side: Secondary | ICD-10-CM

## 2015-12-30 ENCOUNTER — Other Ambulatory Visit: Payer: Self-pay | Admitting: Internal Medicine

## 2015-12-30 NOTE — Therapy (Signed)
[place] [address] [place], [place], [address] Phone: [phone]   Fax:  [phone]  Physical Therapy Treatment  Patient Details  Name: Eric Patterson MRN: [id number] Date of Birth: 05/31/1965 Referring Provider: Larey Dresser, MD  Encounter Date: 12/29/2015      PT End of Session - 12/29/15 1525    Visit Number 3   Number of Visits 7   Date for PT Re-Evaluation 01/26/16   Authorization Type [employer]   PT Start Time 1447   PT Stop Time 1530   PT Time Calculation (min) 43 min   Activity Tolerance Patient tolerated treatment well   Behavior During Therapy [facility] for tasks assessed/performed      Past Medical History:  Diagnosis Date  . Hypertension     No past surgical history on file.  There were no vitals filed for this visit.      Subjective Assessment - 12/29/15 1451    Subjective "I did some walking outside on the grass this weekend."   Patient is accompained by: Family member   Limitations Walking;House hold activities   Patient Stated Goals "I want to walk without the cane."    Currently in Pain? No/denies          NMR:  Performed LLE NMR during session for improved control in closed chain tasks; performing RLE tapping to objects while LLE maintained in stance flexed position.  Performed x 10 reps with rest break at halfway point.  Progressed to LLE in flexed position on EOM reaching for target elevating RLE from floor progressing to holding UE to target for sustained activation of LLE.  Tolerated well during session.  Ended with RLE forward and retro stepping (to heel and back) with LLE in flexed position x 10 reps.    TA:  Performed simulated work tasks during session climbing ladder and reaching for objects as well as folding ladder, carrying down hallway and returning.  Pt able to perform at S level with min cues for maintaining single UE support for increased safety during tasks.    Gait: Addressed gait over varying outdoor surfaces to work towards STG/LTG.  Pt ambulated approx 800' over grassy surfaces, pine straw, and up/down hill.  Pt able to ambulate at S level with min cues for improved LLE control and increasing time spent on LLE during stance.  Note deficits increased with increased fatigue.                         PT Education - 12/29/15 1452    Education provided Yes   Education Details speaking with MD regarding return to work and driving.    Person(s) Educated Patient;Spouse   Methods Explanation   Comprehension Verbalized understanding          PT Short Term Goals - 12/29/15 1520      PT SHORT TERM GOAL #1   Title Pt will be independent with HEP in order to indicate improved functional mobility.  (Target Date: 01/02/16)   Baseline met 12/29/15   Status Achieved     PT SHORT TERM GOAL #2   Title Pt will be able to verbalize warning signs and risk factors for CVA in order to reduce time to seeking medical attention in case of another CVA.     Baseline min cues, but pt able to name several warning signs.     Status Partially Met     PT SHORT TERM GOAL #  3   Title Pt will ambulate up to 300' without AD over varying indoor surfaces without AD without gait deviations to indicate increased independence with gait in home.     Baseline met 12/29/15   Status Achieved           PT Long Term Goals - 12/12/15 1304      PT LONG TERM GOAL #1   Title Pt will improve FGA score to 22/30 in order to indicate decreased fall risk.  (Target Date: 01/23/16)   Status New     PT LONG TERM GOAL #2   Title Pt will ambulate up to 1000' over varying outdoor surfaces (grass, paved, curb, etc) without AD at mod I level in order to indicate safety in community and return to work.     Status New     PT LONG TERM GOAL #3   Title Pt will be able to simulate work activities (climbing, getting onto floor and back up, etc) at independent level in order to  demonstrate safe return to work.     Status New     PT LONG TERM GOAL #4   Title Pt will ambulate with gait speed >2.62 ft/sec without device with decreased gait deviations in order to indicate improved efficiency of gait.    Status New               Plan - 12/30/15 1014    Clinical Impression Statement Skilled session focused on outdoor gait, LLE NMR with closed chain activities, and work simulate tasks.  Pt has met 2/3 STGs and partially meeting 3rd goal for CVA warning signs/risk factors.     Rehab Potential Excellent   Clinical Impairments Affecting Rehab Potential HTN, hx of tobacco and ETOH abuse   PT Frequency 1x / week   PT Duration 6 weeks   PT Treatment/Interventions ADLs/Self Care Home Management;Gait training;Stair training;Functional mobility training;Therapeutic activities;Therapeutic exercise;Balance training;Neuromuscular re-education;Patient/family education;Energy conservation   PT Next Visit Plan check compliance with current HEP, add hip flex exercise.  Gait training without device looking at improve gait mechanics (loading LLE during stance).     PT Home Exercise Plan see pt instruction.     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 Hypertension 11/28/2015  . CVA (cerebral infarction) 11/28/2015    Emily Parcell,  PT, MPT [facility] [address] [place], [place], [address] Phone: [phone]   Fax:  [phone] 12/30/15, 10:18 AM  Name: Eric Patterson MRN: [id number] Date of Birth: 09/11/1965    

## 2016-01-05 ENCOUNTER — Ambulatory Visit: Payer: 59 | Admitting: Rehabilitation

## 2016-01-05 ENCOUNTER — Encounter: Payer: Self-pay | Admitting: Rehabilitation

## 2016-01-05 DIAGNOSIS — I69354 Hemiplegia and hemiparesis following cerebral infarction affecting left non-dominant side: Secondary | ICD-10-CM

## 2016-01-05 DIAGNOSIS — R2681 Unsteadiness on feet: Secondary | ICD-10-CM

## 2016-01-05 DIAGNOSIS — R2689 Other abnormalities of gait and mobility: Secondary | ICD-10-CM

## 2016-01-05 NOTE — Therapy (Signed)
[facility] [address] [place], [place], [address] Phone: [phone]   Fax:  [phone]  Physical Therapy Treatment  Patient Details  Name: Eric Patterson MRN: [id number] Date of Birth: 03/27/1966 Referring Provider: Elizabeth Butcher, MD  Encounter Date: 01/05/2016      PT End of Session - 01/06/16 0931    Visit Number 4   Number of Visits 7   Date for PT Re-Evaluation 01/26/16   Authorization Type [employer]   PT Start Time 1448   PT Stop Time 1530   PT Time Calculation (min) 42 min   Activity Tolerance Patient tolerated treatment well   Behavior During Therapy WFL for tasks assessed/performed      Past Medical History:  Diagnosis Date  . Hypertension     History reviewed. No pertinent surgical history.  There were no vitals filed for this visit.      Subjective Assessment - 01/05/16 1454    Subjective Reports no changes, no falls.    Patient is accompained by: Family member   Limitations Walking;House hold activities   Patient Stated Goals "I want to walk without the cane."    Currently in Pain? No/denies          NMR:  Continue to address LLE proximal and distal stability with open and closed chain activity.  In // bars performing standing on bosu ball with black top up, maintaining balance x 2 reps of 30 secs progressing to mini squats on BOSU ball x 10 reps with cues for technique and equal WB (use of mirror for visual feedback).  Progressed to keeping LLE placed on BOSU ball (blue top up) advancing and retro stepping RLE over BOSU ball with single UE support x 10 reps>no UE support x 10 reps with cues for improved ankle stability with noted tendency to invert.  Progressed to mat level activities; LLE only bridging holding RLE in SLR to reduce pressure through RLE x 10 reps, BLE bridging with LEs on physioball x 10 reps, BLE bridging with hamstring curl on physioball x 10 reps with cues for equal  activation on LEs.  Ended session with quadruped activity alternating UE/LE x 10 reps and transitional movements from tall kneeling>half kneeling>standing and vice versa.  Initially requiring mod A for LOB, however with repetition, note marked improvement and stability in LLE.                         PT Education - 01/05/16 1455    Education provided Yes   Education Details education on D.C from therapy following 9/5 visit.    Person(s) Educated Patient   Methods Explanation   Comprehension Verbalized understanding          PT Short Term Goals - 12/29/15 1520      PT SHORT TERM GOAL #1   Title Pt will be independent with HEP in order to indicate improved functional mobility.  (Target Date: 01/02/16)   Baseline met 12/29/15   Status Achieved     PT SHORT TERM GOAL #2   Title Pt will be able to verbalize warning signs and risk factors for CVA in order to reduce time to seeking medical attention in case of another CVA.     Baseline min cues, but pt able to name several warning signs.     Status Partially Met     PT SHORT TERM GOAL #3   Title Pt will ambulate up to 300'   without AD over varying indoor surfaces without AD without gait deviations to indicate increased independence with gait in home.     Baseline met 12/29/15   Status Achieved           PT Long Term Goals - 12/12/15 1304      PT LONG TERM GOAL #1   Title Pt will improve FGA score to 22/30 in order to indicate decreased fall risk.  (Target Date: 01/23/16)   Status New     PT LONG TERM GOAL #2   Title Pt will ambulate up to 1000' over varying outdoor surfaces (grass, paved, curb, etc) without AD at mod I level in order to indicate safety in community and return to work.     Status New     PT LONG TERM GOAL #3   Title Pt will be able to simulate work activities (climbing, getting onto floor and back up, etc) at independent level in order to demonstrate safe return to work.     Status New     PT  LONG TERM GOAL #4   Title Pt will ambulate with gait speed >2.62 ft/sec without device with decreased gait deviations in order to indicate improved efficiency of gait.    Status New               Plan - 01/06/16 0932    Clinical Impression Statement Continue to address high level balance and LLE NMR during session to carryover to more normalized gait pattern.  Continue to note marked improvement during sessions.      Rehab Potential Excellent   Clinical Impairments Affecting Rehab Potential HTN, hx of tobacco and ETOH abuse   PT Frequency 1x / week   PT Duration 6 weeks   PT Treatment/Interventions ADLs/Self Care Home Management;Gait training;Stair training;Functional mobility training;Therapeutic activities;Therapeutic exercise;Balance training;Neuromuscular re-education;Patient/family education;Energy conservation   PT Next Visit Plan Gait training without device looking at improve gait mechanics (loading LLE during stance).  LLE NMR Eric Patterson you see him next two visits-he will be ready for D/C on 9/5 visit- I can do D/C)   PT Home Exercise Plan see pt instruction.     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 Hypertension 11/28/2015  . CVA (cerebral infarction) 11/28/2015    Cameron Sprang, PT, MPT [facility] [address] [place], [place], [address] Phone: [phone]   Fax:  [phone] 01/06/16, 9:35 AM  Name: Eric Patterson MRN: [id number] Date of Birth: 01-Apr-1966

## 2016-01-12 ENCOUNTER — Ambulatory Visit: Payer: 59 | Admitting: Physical Therapy

## 2016-01-14 ENCOUNTER — Ambulatory Visit: Payer: 59 | Admitting: Physical Therapy

## 2016-01-14 VITALS — BP 116/82

## 2016-01-14 DIAGNOSIS — R2681 Unsteadiness on feet: Secondary | ICD-10-CM

## 2016-01-14 DIAGNOSIS — I69354 Hemiplegia and hemiparesis following cerebral infarction affecting left non-dominant side: Secondary | ICD-10-CM

## 2016-01-14 DIAGNOSIS — R2689 Other abnormalities of gait and mobility: Secondary | ICD-10-CM

## 2016-01-14 NOTE — Therapy (Signed)
[facility] [address] [place], [place], [address] Phone: [phone]   Fax:  [phone]  Physical Therapy Treatment  Patient Details  Name: Eric Patterson MRN: [id number] Date of Birth: Dec 10, 1965 Referring Provider: Larey Dresser, MD  Encounter Date: 01/14/2016      PT End of Session - 01/14/16 1355    Visit Number 5   Number of Visits 7   Date for PT Re-Evaluation 01/26/16   Authorization Type [employer]   PT Start Time 1018   PT Stop Time 1105   PT Time Calculation (min) 47 min   Activity Tolerance Patient tolerated treatment well   Behavior During Therapy [facility] for tasks assessed/performed      Past Medical History:  Diagnosis Date  . Hypertension     No past surgical history on file.  Vitals:   01/14/16 1027  BP: 116/82 beginning of session               122/79 end of session        Subjective Assessment - 01/14/16 1021    Subjective Pt reports that he is planning to make an appointment with  PCP soon due to feeling a little" fuzy" at times.   Patient is accompained by: Family member   Limitations Walking;House hold activities   Patient Stated Goals "I want to walk without the cane."    Currently in Pain? No/denies                         [facility] Adult PT Treatment/Exercise - 01/14/16 0001      Ambulation/Gait   Ambulation/Gait Yes   Ambulation/Gait Assistance 5: Supervision   Ambulation/Gait Assistance Details Gait trainer on treadmill- 8 min working working on [employer], speed and foot clearance.  Demonstrates good carry over, speed slower than normal range   Ambulation Distance (Feet) 1000 Feet   Assistive device None   Gait Pattern Step-through pattern;Decreased step length - left;Decreased hip/knee flexion - left;Decreased stance time - left;Wide base of support   Ambulation Surface Level;Unlevel;Indoor;Outdoor;Paved;Gravel;Grass     Knee/Hip Exercises: Aerobic   Elliptical 80mn pt's LLE fatigued quickly.     Knee/Hip Exercises: Machines for Strengthening   Cybex Leg Press 100# with both LE x10, 60# with LLE only x10 then #70 x10                PT Education - 01/14/16 1354    Education provided Yes   Education Details Continuing fitness plan, the importance of aerobic activity for cardio health, progressing speed with current walking plan, machine [facility]   Person(s) Educated Patient   Methods Explanation;Demonstration;Verbal cues   Comprehension Verbalized understanding          PT Short Term Goals - 12/29/15 1520      PT SHORT TERM GOAL #1   Title Pt will be independent with HEP in order to indicate improved functional mobility.  (Target Date: 01/02/16)   Baseline met 12/29/15   Status Achieved     PT SHORT TERM GOAL #2   Title Pt will be able to verbalize warning signs and risk factors for CVA in order to reduce time to seeking medical attention in case of another CVA.     Baseline min cues, but pt able to name several warning signs.     Status Partially Met     PT SHORT TERM GOAL #3  Title Pt will ambulate up to 300' without AD over varying indoor surfaces without AD without gait deviations to indicate increased independence with gait in home.     Baseline met 12/29/15   Status Achieved           PT Long Term Goals - 01/14/16 1357      PT LONG TERM GOAL #1   Title Pt will improve FGA score to 22/30 in order to indicate decreased fall risk.  (Target Date: 01/23/16)   Status New     PT LONG TERM GOAL #2   Title Pt will ambulate up to 1000' over varying outdoor surfaces (grass, paved, curb, etc) without AD at mod I level in order to indicate safety in community and return to work.     Baseline Met, 01/14/16   Status Achieved     PT LONG TERM GOAL #3   Title Pt will be able to simulate work activities (climbing, getting onto floor and back up, etc) at independent level in order to  demonstrate safe return to work.     Status New     PT LONG TERM GOAL #4   Title Pt will ambulate with gait speed >2.62 ft/sec without device with decreased gait deviations in order to indicate improved efficiency of gait.    Status New               Plan - 01/14/16 1356    Clinical Impression Statement Pt demonstrated understanding and safety performing activities on gym type equipment to prepare for D/C.  Pt met LTG#2.   Rehab Potential Excellent   Clinical Impairments Affecting Rehab Potential HTN, hx of tobacco and ETOH abuse   PT Frequency 1x / week   PT Duration 6 weeks   PT Treatment/Interventions ADLs/Self Care Home Management;Gait training;Stair training;Functional mobility training;Therapeutic activities;Therapeutic exercise;Balance training;Neuromuscular re-education;Patient/family education;Energy conservation   PT Next Visit Plan  D/C on 9/5 visit- I can do D/C)   PT Home Exercise Plan see pt instruction.     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 Hypertension 11/28/2015  . CVA (cerebral infarction) 11/28/2015    Bjorn Loser, PTA  01/14/16, 2:00 PM [facility] [address] [place], [place], [address] Phone: [phone]   Fax:  [phone]  Name:  Eric Patterson MRN: [id number] Date of Birth: 1966-04-30

## 2016-01-20 ENCOUNTER — Ambulatory Visit: Payer: 59 | Attending: Internal Medicine | Admitting: Physical Therapy

## 2016-01-20 DIAGNOSIS — R2681 Unsteadiness on feet: Secondary | ICD-10-CM | POA: Diagnosis present

## 2016-01-20 DIAGNOSIS — R2689 Other abnormalities of gait and mobility: Secondary | ICD-10-CM | POA: Diagnosis present

## 2016-01-20 DIAGNOSIS — I69354 Hemiplegia and hemiparesis following cerebral infarction affecting left non-dominant side: Secondary | ICD-10-CM | POA: Diagnosis present

## 2016-01-20 NOTE — Therapy (Signed)
[facility] [address] [place], [place], [address] Phone: [phone]   Fax:  [phone]  Physical Therapy Treatment  Patient Details  Name: Eric Patterson MRN: [id number] Date of Birth: 28-Jun-1965 Referring Provider: Larey Dresser, MD  Encounter Date: 01/20/2016      PT End of Session - 01/20/16 1650    Visit Number 6   Number of Visits 7   Date for PT Re-Evaluation 01/26/16   Authorization Type [employer]   PT Start Time 1448   PT Stop Time 1526   PT Time Calculation (min) 38 min   Activity Tolerance Patient tolerated treatment well   Behavior During Therapy [facility] for tasks assessed/performed      Past Medical History:  Diagnosis Date  . Hypertension     No past surgical history on file.  There were no vitals filed for this visit.      Subjective Assessment - 01/20/16 1454    Subjective Pt expressed perception of having a "stiff leg" on the left. When asked about HEP compliance, pt states, "I probably do them about every other day."  When DC discussed with pt/wife, wife states, "I really don't think he's ready to stop therapy yet...he's still stumbling now and then."   Patient is accompained by: Family member  wife, Eric Patterson   Limitations Walking;House hold activities   Patient Stated Goals "I want to walk without the cane."    Currently in Pain? No/denies            [facility] PT Assessment - 01/20/16 0001      Functional Gait  Assessment   Gait assessed  Yes   Gait Level Surface Walks 20 ft in less than 7 sec but greater than 5.5 sec, uses assistive device, slower speed, mild gait deviations, or deviates 6-10 in outside of the 12 in walkway width.   Change in Gait Speed Able to change speed, demonstrates mild gait deviations, deviates 6-10 in outside of the 12 in walkway width, or no gait deviations, unable to achieve a major change in velocity, or uses a change in velocity, or uses an assistive device.    Gait with Horizontal Head Turns Performs head turns smoothly with slight change in gait velocity (eg, minor disruption to smooth gait path), deviates 6-10 in outside 12 in walkway width, or uses an assistive device.   Gait with Vertical Head Turns Performs head turns with no change in gait. Deviates no more than 6 in outside 12 in walkway width.   Gait and Pivot Turn Pivot turns safely in greater than 3 sec and stops with no loss of balance, or pivot turns safely within 3 sec and stops with mild imbalance, requires small steps to catch balance.   Step Over Obstacle Is able to step over one shoe box (4.5 in total height) without changing gait speed. No evidence of imbalance.   Gait with Narrow Base of Support Is able to ambulate for 10 steps heel to toe with no staggering.   Gait with Eyes Closed Walks 20 ft, slow speed, abnormal gait pattern, evidence for imbalance, deviates 10-15 in outside 12 in walkway width. Requires more than 9 sec to ambulate 20 ft.  11.15 sec   Ambulating Backwards Walks 20 ft, uses assistive device, slower speed, mild gait deviations, deviates 6-10 in outside 12 in walkway width.   Steps Alternating feet, must use rail.   Total Score 21  [facility] Adult PT Treatment/Exercise - 01/20/16 0001      Transfers   Transfers Sit to Stand;Stand to Sit   Sit to Stand 6: Modified independent (Device/Increase time)   Stand to Sit 6: Modified independent (Device/Increase time)     Ambulation/Gait   Ambulation/Gait Yes   Ambulation/Gait Assistance 5: Supervision;6: Modified independent (Device/Increase time)   Ambulation/Gait Assistance Details Gait x500' over unlevel, paved and grass surfaces with mod I during linear gait without external demands; (S) required during gait with head turns due to gait instability. Prior to stretching L quadriceps, pt with increased L hip ABD, wide BOS, and decreased L hip/knee flexion during LLE advancement.   Ambulation  Distance (Feet) 700 Feet  x500' unlevel, paved and grass; x200' level, indoor   Assistive device None   Gait Pattern Step-through pattern;Decreased step length - left;Decreased hip/knee flexion - left;Wide base of support;Abducted - left   Ambulation Surface Level;Unlevel;Indoor;Outdoor;Paved;Grass   Gait velocity 3.06 ft/sec     Exercises   Exercises Other Exercises   Other Exercises  When reviewing current HEP, pt required up to mod cueing for technique with exercises. With verbal/demo cueing from this PT, pt performed the following: L Thomas stretch in supine 3 x60-sec holds for increased extensibility of L hip flexors/knee extensors; tall kneeling <> R/L half kneeling x10 reps with cueing for B hip extension throughout; gait with horizontal head turns 2 x50' in hallway with wall on L side due to intermittent LOB to L with cueing for sequencing.                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 Explained stretching to decrease tone, increase control in LLE.  Educated pt/wife that pt has improved significantly, but is just below goal-level. Recommended one additional session.  Strongly emphasized the importance of HEP compliance every day.   Person(s) Educated Patient;Spouse   Methods Explanation;Demonstration;Verbal cues;Handout   Comprehension Verbalized understanding;Returned demonstration          PT Short Term Goals - 12/29/15 1520      PT SHORT TERM GOAL #1   Title Pt will be independent with HEP in order to indicate improved functional mobility.  (Target Date: 01/02/16)   Baseline met 12/29/15   Status Achieved     PT SHORT TERM GOAL #2   Title Pt will be able to verbalize warning signs and risk factors for CVA in order to reduce time to seeking medical attention in case of another CVA.     Baseline min cues, but pt able to name several warning signs.     Status Partially Met     PT SHORT TERM GOAL #3    Title Pt will ambulate up to 300' without AD over varying indoor surfaces without AD without gait deviations to indicate increased independence with gait in home.     Baseline met 12/29/15   Status Achieved           PT Long Term Goals - 01/20/16 1455      PT LONG TERM GOAL #1   Title Pt will improve FGA score to 22/30 in order to indicate decreased fall risk.  (Target Date: 01/23/16)   Baseline 9/5: FGA score = 21/30   Status Partially Met     PT LONG TERM GOAL #2   Title Pt will ambulate up to 1000' over varying outdoor surfaces (grass, paved, curb, etc) without AD  at mod I level in order to indicate safety in community and return to work.     Baseline Met 01/14/16; however, note gait instability over unlevel, grass surfaces when turning head to speak to PT   Status Partially Met     PT LONG TERM GOAL #3   Title Pt will be able to simulate work activities (climbing, getting onto floor and back up, etc) at independent level in order to demonstrate safe return to work.     Status On-going     PT LONG TERM GOAL #4   Title Pt will ambulate with gait speed >2.62 ft/sec without device with decreased gait deviations in order to indicate improved efficiency of gait.    Baseline 9/5: gait velocity = 3.06 ft/sec   Status Achieved               Plan - 01/20/16 1650    Clinical Impression Statement Wife expressing concern with planned DC from PT today, reporting that patient often "stumbles" while walking. Upon further questioning/assessment, note that pt has not been performing HEP consistently and required mod cueing to perform correctly. FGA score = 21/30, which is just below goal-level of 22/30. Gait instability noted only with horizontal head turns. Therefore, modified HEP to the following: 1) gait with horizontal head turns, 2) tall kneeling <> half kneeling; and 3) L quadriceps stretch for LLE tone management. Emphasized that patient perform HEP every day. Educated pt/wife that  primary PT will see patient for one additional session on 9/11, as pt did miss previous appt and this if prior to end of POC.  Pt/wife verbally agreed.   Rehab Potential Excellent   Clinical Impairments Affecting Rehab Potential HTN, hx of tobacco and ETOH abuse   PT Frequency 1x / week   PT Duration 6 weeks   PT Treatment/Interventions ADLs/Self Care Home Management;Gait training;Stair training;Functional mobility training;Therapeutic activities;Therapeutic exercise;Balance training;Neuromuscular re-education;Patient/family education;Energy conservation   PT Next Visit Plan  D/C on 9/11 appt   Consulted and Agree with Plan of Care Patient;Family member/caregiver   Family Member Consulted Wife, Eric Patterson      Patient will benefit from skilled therapeutic intervention in order to improve the following deficits and impairments:  Abnormal gait, Decreased activity tolerance, Decreased balance, Decreased endurance, Decreased mobility, Decreased safety awareness, Decreased strength, Impaired perceived functional ability, Postural dysfunction  Visit Diagnosis: Unsteadiness on feet  Hemiplegia and hemiparesis following cerebral infarction affecting left non-dominant side (HCC)  Other abnormalities of gait and mobility     Problem List Patient Active Problem List   Diagnosis Date Noted  . Hypertension 11/28/2015  . CVA (cerebral infarction) 11/28/2015    Billie Ruddy, PT, DPT [facility] [address] [place], [place], [address] Phone: [phone]   Fax:  [phone] 01/20/16, 5:08 PM  Name: Eric Patterson MRN: [id number] Date of Birth: May 15, 1966

## 2016-01-20 NOTE — Patient Instructions (Signed)
    Start on floor on both of your knees with L leg close to couch.  Transition into position pictured above with RLE moving up and down.  Repeat x 10 reps and perform 2-3 times a day.  Think about staying tall, using UE support as little as you can and keeping L hip tucked!  Pelvic Rotation: Knee-to-Chest (Supine)    Lie on your back on your low couch with your LEFT side close to the edge. Bring right knee to chest; LEFT leg off the edge of the couch, foot resting on floor. You should feel a stretch in the front of your LEFT thigh. Hold for 1 minute, __2-3__ times per day on the LEFT leg.  Walking Head Turn    Standing with a wall on your LEFT side, walk the length of your hallway while turning head right to left (2 steps with head to the right, 2 steps with head to the left). Touch wall for balance, as needed. Practice for 2-3 minutes at a time, __2__ times per day.  Copyright  [employer]. All rights reserved.

## 2016-01-25 ENCOUNTER — Other Ambulatory Visit: Payer: Self-pay | Admitting: Pulmonary Disease

## 2016-01-26 ENCOUNTER — Ambulatory Visit: Payer: 59 | Admitting: Rehabilitation

## 2016-01-26 ENCOUNTER — Ambulatory Visit: Payer: Self-pay | Admitting: Neurology

## 2016-01-29 ENCOUNTER — Ambulatory Visit: Payer: 59 | Admitting: Rehabilitation

## 2016-01-30 ENCOUNTER — Encounter: Payer: Self-pay | Admitting: Physical Therapy

## 2016-01-30 ENCOUNTER — Ambulatory Visit: Payer: 59 | Admitting: Physical Therapy

## 2016-01-30 DIAGNOSIS — R2681 Unsteadiness on feet: Secondary | ICD-10-CM

## 2016-01-30 DIAGNOSIS — I69354 Hemiplegia and hemiparesis following cerebral infarction affecting left non-dominant side: Secondary | ICD-10-CM

## 2016-01-30 DIAGNOSIS — R2689 Other abnormalities of gait and mobility: Secondary | ICD-10-CM

## 2016-01-30 NOTE — Therapy (Signed)
[facility] [address] [place], [place], [address] Phone: [phone]   Fax:  [phone]  Physical Therapy Treatment  Patient Details  Name: Eric Patterson MRN: [id number] Date of Birth: [birth year] Referring Provider: Larey Dresser, MD  Encounter Date: 01/30/2016      PT End of Session - 01/30/16 1157    Visit Number 7   Number of Visits 7   Date for PT Re-Evaluation 01/30/16  starting 9/12-9/15/17   Authorization Type [employer]   PT Start Time 1107   PT Stop Time 1137  Session ended Eric due to all goals met, patient discharged   PT Time Calculation (min) 30 min   Activity Tolerance Patient tolerated treatment well   Behavior During Therapy [facility] for tasks assessed/performed      Past Medical History:  Diagnosis Date  . Hypertension     History reviewed. No pertinent surgical history.  There were no vitals filed for this visit.      Subjective Assessment - 01/30/16 1153    Subjective Pt reports no issues or problems since last visit and is agreeable to d/c from therapy.  He feels that he has met his goals and is satisfied with his progress in therapy.   Patient is accompained by:    Limitations Walking;House hold activities   Patient Stated Goals "I want to walk without the cane."    Currently in Pain? No/denies   Multiple Pain Sites No            [facility] PT Assessment - 01/30/16 1100      Transfers   Transfers Sit to Stand;Stand to Sit;Stand Pivot Transfers   Sit to Stand 7: Independent   Stand to Sit 7: Independent   Stand Pivot Transfers 7: Independent     Ambulation/Gait   Ambulation/Gait Yes   Ambulation/Gait Assistance 7: Independent   Ambulation/Gait Assistance Details Gait outdoors on uneven and even surfaces including pavement, grass, declines, inclines, and stepping over curbs.   Pt required no cuig and no gait deviations observed.   Ambulation Distance (Feet) 1000 Feet  1x627f  on even surfaces and 1x4032fon uneven surfaces   Assistive device None   Gait Pattern Step-through pattern;Trunk flexed   Ambulation Surface Level;Unlevel;Indoor;Outdoor;Paved;Gravel;Grass   Stairs Yes   Stairs Assistance 7: Independent   Stair Management Technique No rails   Number of Stairs 4   Height of Stairs 6   Door Management 7: Independent   Ramp 7: Independent   Curb 7: Independent     Functional Gait  Assessment   Gait assessed  Yes   Gait Level Surface Walks 20 ft in less than 7 sec but greater than 5.5 sec, uses assistive device, slower speed, mild gait deviations, or deviates 6-10 in outside of the 12 in walkway width.   Change in Gait Speed Able to smoothly change walking speed without loss of balance or gait deviation. Deviate no more than 6 in outside of the 12 in walkway width.   Gait with Horizontal Head Turns Performs head turns smoothly with slight change in gait velocity (eg, minor disruption to smooth gait path), deviates 6-10 in outside 12 in walkway width, or uses an assistive device.   Gait with Vertical Head Turns Performs head turns with no change in gait. Deviates no more than 6 in outside 12 in walkway width.   Gait and Pivot Turn Pivot turns safely in greater than 3 sec and stops with no loss of balance,  or pivot turns safely within 3 sec and stops with mild imbalance, requires small steps to catch balance.   Step Over Obstacle Is able to step over one shoe box (4.5 in total height) without changing gait speed. No evidence of imbalance.   Gait with Narrow Base of Support Ambulates 7-9 steps.   Gait with Eyes Closed Walks 20 ft, uses assistive device, slower speed, mild gait deviations, deviates 6-10 in outside 12 in walkway width. Ambulates 20 ft in less than 9 sec but greater than 7 sec.   Ambulating Backwards Walks 20 ft, uses assistive device, slower speed, mild gait deviations, deviates 6-10 in outside 12 in walkway width.   Steps Alternating feet, no rail.    Total Score 23                     [facility] Adult PT Treatment/Exercise - 01/30/16 1100      Therapeutic Activites    Therapeutic Activities Work Simulation   Work Simulation Pt ascended/descended ladder to simulate tasks performed at work in order to assess pt safety at work.  Cuing needed for alternate foot/hand placement and avoid reaching directly overhead to prevent falling. Pt verbalized understanding and returned demonstration.                PT Education - 01/30/16 1156    Education provided Yes   Education Details Pt educated on importance of performing HEP daily and safety cues to use at home and at work, especially when using a ladder.   Person(s) Educated Patient   Methods Explanation;Verbal cues   Comprehension Verbalized understanding;Returned demonstration          PT Short Term Goals - 12/29/15 1520      PT SHORT TERM GOAL #1   Title Pt will be independent with HEP in order to indicate improved functional mobility.  (Target Date: 01/02/16)   Baseline met 12/29/15   Status Achieved     PT SHORT TERM GOAL #2   Title Pt will be able to verbalize warning signs and risk factors for CVA in order to reduce time to seeking medical attention in case of another CVA.     Baseline min cues, but pt able to name several warning signs.     Status Partially Met     PT SHORT TERM GOAL #3   Title Pt will ambulate up to 300' without AD over varying indoor surfaces without AD without gait deviations to indicate increased independence with gait in home.     Baseline met 12/29/15   Status Achieved           PT Long Term Goals - 01/30/16 1206      PT LONG TERM GOAL #1   Title Pt will improve FGA score to 22/30 in order to indicate decreased fall risk.  (Target Date: 01/23/16)   Baseline GOAL MET: 01/30/16; FGA = 23/30   Status Achieved     PT LONG TERM GOAL #2   Title Pt will ambulate up to 1000' over varying outdoor surfaces (grass, paved, curb, etc)  without AD at mod I level in order to indicate safety in community and return to work.     Baseline GOAL MET: 01/30/16   Status Achieved     PT LONG TERM GOAL #3   Title Pt will be able to simulate work activities (climbing, getting onto floor and back up, etc) at independent level in order to demonstrate safe return to work.  Baseline GOAL MET: 01/30/16   Status Achieved     PT LONG TERM GOAL #4   Title Pt will ambulate with gait speed >2.62 ft/sec without device with decreased gait deviations in order to indicate improved efficiency of gait.    Baseline 9/5: gait velocity = 3.06 ft/sec   Status Achieved               Plan - 01/30/16 1158    Clinical Impression Statement Pt has made significant progress since his initial evaluation for therapy and states that he is satisfied with his progress.  Pt was able to meet all his STGs and LTGs including scoring a 23/30 on the FGA indicating a decr risk of falls.  He was also able to return demonstration of ascending and descending a ladder to satisfy safety concerns at work.  Because of pt meeting all goals and being satisfied with his therapy outcomes, pt will be d/c from therapy after today's session.   Rehab Potential Excellent   Clinical Impairments Affecting Rehab Potential HTN, hx of tobacco and ETOH abuse   PT Frequency 1x / week   PT Duration 6 weeks   PT Treatment/Interventions ADLs/Self Care Home Management;Gait training;Stair training;Functional mobility training;Therapeutic activities;Therapeutic exercise;Balance training;Neuromuscular re-education;Patient/family education;Energy conservation   PT Next Visit Plan --   Consulted and Agree with Plan of Care Patient      Patient will benefit from skilled therapeutic intervention in order to improve the following deficits and impairments:  Abnormal gait, Decreased activity tolerance, Decreased balance, Decreased endurance, Decreased mobility, Decreased safety awareness,  Decreased strength, Impaired perceived functional ability, Postural dysfunction  Visit Diagnosis: Unsteadiness on feet  Hemiplegia and hemiparesis following cerebral infarction affecting left non-dominant side (HCC)  Other abnormalities of gait and mobility     Problem List Patient Active Problem List   Diagnosis Date Noted  . Hypertension 11/28/2015  . CVA (cerebral infarction) 11/28/2015   PHYSICAL THERAPY DISCHARGE SUMMARY  Visits from Start of Care: 7 Current functional level related to goals / functional outcomes: Currently, pt has demonstrated an ability to ambulate on varying surfaces indoors and outdoors including pavement, grass, gravel, inclines, declines, and curbs all with no assistance needed at distances necessary for him to perform ADLs at home and in the community including work related activities.  His endurance and strength are all at levels necessary to warrant d/c from therapy.   Remaining deficits: Pt does not currently present with any significant deficits that would cause any concern for his safety.   Education / Equipment: Pt educated on the signs and symptoms related to CVA in order for pt to be able to recognize and seek medical attention.  Pt also educated on HEP and fitness plan that will enable him to stay healthy and maintain an active lifestyle. Plan: Patient agrees to discharge.  Patient goals were met. Patient is being discharged due to meeting the stated rehab goals.  ?????        Rosezella Rumpf, SPT  Name: Eric Patterson MRN: [id number] Date of Birth: 12/17/1965 Katherine Mantle 01/30/2016, 4:38 PM  [facility] [address] [place], [place], [address] Phone: [phone]   Fax:  [phone]  Name: Eric Patterson MRN: [id number] Date of Birth: 11-28-1965

## 2016-01-30 NOTE — Addendum Note (Signed)
Addended by: Jorje GuildHOBBLE, Makih Stefanko A on: 01/30/2016 04:52 PM   Modules accepted: Orders

## 2016-01-30 NOTE — Therapy (Deleted)
[facility] [address] [place], [place], [address] Phone: [phone]   Fax:  [phone]  Physical Therapy Treatment  Patient Details  Name: Eric Patterson MRN: [id number] Date of Birth: [birth year] Referring Provider: Larey Dresser, MD  Encounter Date: 01/30/2016      PT End of Session - 01/30/16 1157    Visit Number 7   Number of Visits 7   Date for PT Re-Evaluation 01/30/16  starting 9/12-9/15/17   Authorization Type [employer]   PT Start Time 1107   PT Stop Time 1137  Session ended early due to all goals met, patient discharged   PT Time Calculation (min) 30 min   Activity Tolerance Patient tolerated treatment well   Behavior During Therapy [facility] for tasks assessed/performed      Past Medical History:  Diagnosis Date  . Hypertension     History reviewed. No pertinent surgical history.  There were no vitals filed for this visit.      Subjective Assessment - 01/30/16 1153    Subjective Pt reports no issues or problems since last visit and is agreeable to d/c from therapy.  He feels that he has met his goals and is satisfied with his progress in therapy.   Patient is accompained by:    Limitations Walking;House hold activities   Patient Stated Goals "I want to walk without the cane."    Currently in Pain? No/denies   Multiple Pain Sites No            [facility] PT Assessment - 01/30/16 1100      Transfers   Transfers Sit to Stand;Stand to Sit;Stand Pivot Transfers   Sit to Stand 7: Independent   Stand to Sit 7: Independent   Stand Pivot Transfers 7: Independent     Ambulation/Gait   Ambulation/Gait Yes   Ambulation/Gait Assistance 7: Independent   Ambulation/Gait Assistance Details Gait outdoors on uneven and even surfaces including pavement, grass, declines, inclines, and stepping over curbs.   Pt required no cuig and no gait deviations observed.   Ambulation Distance (Feet) 1000 Feet  1x627f  on even surfaces and 1x4032fon uneven surfaces   Assistive device None   Gait Pattern Step-through pattern;Trunk flexed   Ambulation Surface Level;Unlevel;Indoor;Outdoor;Paved;Gravel;Grass   Stairs Yes   Stairs Assistance 7: Independent   Stair Management Technique No rails   Number of Stairs 4   Height of Stairs 6   Door Management 7: Independent   Ramp 7: Independent   Curb 7: Independent     Functional Gait  Assessment   Gait assessed  Yes   Gait Level Surface Walks 20 ft in less than 7 sec but greater than 5.5 sec, uses assistive device, slower speed, mild gait deviations, or deviates 6-10 in outside of the 12 in walkway width.   Change in Gait Speed Able to smoothly change walking speed without loss of balance or gait deviation. Deviate no more than 6 in outside of the 12 in walkway width.   Gait with Horizontal Head Turns Performs head turns smoothly with slight change in gait velocity (eg, minor disruption to smooth gait path), deviates 6-10 in outside 12 in walkway width, or uses an assistive device.   Gait with Vertical Head Turns Performs head turns with no change in gait. Deviates no more than 6 in outside 12 in walkway width.   Gait and Pivot Turn Pivot turns safely in greater than 3 sec and stops with no loss of balance,  or pivot turns safely within 3 sec and stops with mild imbalance, requires small steps to catch balance.   Step Over Obstacle Is able to step over one shoe box (4.5 in total height) without changing gait speed. No evidence of imbalance.   Gait with Narrow Base of Support Ambulates 7-9 steps.   Gait with Eyes Closed Walks 20 ft, uses assistive device, slower speed, mild gait deviations, deviates 6-10 in outside 12 in walkway width. Ambulates 20 ft in less than 9 sec but greater than 7 sec.   Ambulating Backwards Walks 20 ft, uses assistive device, slower speed, mild gait deviations, deviates 6-10 in outside 12 in walkway width.   Steps Alternating feet, no rail.    Total Score 23                     [facility] Adult PT Treatment/Exercise - 01/30/16 1100      Therapeutic Activites    Therapeutic Activities Work Simulation   Work Simulation Pt ascended/descended ladder to simulate tasks performed at work in order to assess pt safety at work.  Cuing needed for alternate foot/hand placement and avoid reaching directly overhead to prevent falling. Pt verbalized understanding and returned demonstration.                PT Education - 01/30/16 1156    Education provided Yes   Education Details Pt educated on importance of performing HEP daily and safety cues to use at home and at work, especially when using a ladder.   Person(s) Educated Patient   Methods Explanation;Verbal cues   Comprehension Verbalized understanding;Returned demonstration          PT Short Term Goals - 12/29/15 1520      PT SHORT TERM GOAL #1   Title Pt will be independent with HEP in order to indicate improved functional mobility.  (Target Date: 01/02/16)   Baseline met 12/29/15   Status Achieved     PT SHORT TERM GOAL #2   Title Pt will be able to verbalize warning signs and risk factors for CVA in order to reduce time to seeking medical attention in case of another CVA.     Baseline min cues, but pt able to name several warning signs.     Status Partially Met     PT SHORT TERM GOAL #3   Title Pt will ambulate up to 300' without AD over varying indoor surfaces without AD without gait deviations to indicate increased independence with gait in home.     Baseline met 12/29/15   Status Achieved           PT Long Term Goals - 01/30/16 1206      PT LONG TERM GOAL #1   Title Pt will improve FGA score to 22/30 in order to indicate decreased fall risk.  (Target Date: 01/23/16)   Baseline GOAL MET: 01/30/16; FGA = 23/30   Status Achieved     PT LONG TERM GOAL #2   Title Pt will ambulate up to 1000' over varying outdoor surfaces (grass, paved, curb, etc)  without AD at mod I level in order to indicate safety in community and return to work.     Baseline GOAL MET: 01/30/16   Status Achieved     PT LONG TERM GOAL #3   Title Pt will be able to simulate work activities (climbing, getting onto floor and back up, etc) at independent level in order to demonstrate safe return to work.  Baseline GOAL MET: 01/30/16   Status Achieved     PT LONG TERM GOAL #4   Title Pt will ambulate with gait speed >2.62 ft/sec without device with decreased gait deviations in order to indicate improved efficiency of gait.    Baseline 9/5: gait velocity = 3.06 ft/sec   Status Achieved               Plan - 01/30/16 1158    Clinical Impression Statement Pt has made significant progress since his initial evaluation for therapy and states that he is satisfied with his progress.  Pt was able to meet all his STGs and LTGs including scoring a 23/30 on the FGA indicating a decr risk of falls.  He was also able to return demonstration of ascending and descending a ladder to satisfy safety concerns at work.  Because of pt meeting all goals and being satisfied with his therapy outcomes, pt will be d/c from therapy after today's session.   Rehab Potential Excellent   Clinical Impairments Affecting Rehab Potential HTN, hx of tobacco and ETOH abuse   PT Frequency 1x / week   PT Duration 6 weeks   PT Treatment/Interventions ADLs/Self Care Home Management;Gait training;Stair training;Functional mobility training;Therapeutic activities;Therapeutic exercise;Balance training;Neuromuscular re-education;Patient/family education;Energy conservation   PT Next Visit Plan --   Consulted and Agree with Plan of Care Patient      Patient will benefit from skilled therapeutic intervention in order to improve the following deficits and impairments:  Abnormal gait, Decreased activity tolerance, Decreased balance, Decreased endurance, Decreased mobility, Decreased safety awareness,  Decreased strength, Impaired perceived functional ability, Postural dysfunction  Visit Diagnosis: Unsteadiness on feet  Hemiplegia and hemiparesis following cerebral infarction affecting left non-dominant side (HCC)  Other abnormalities of gait and mobility     Problem List Patient Active Problem List   Diagnosis Date Noted  . Hypertension 11/28/2015  . CVA (cerebral infarction) 11/28/2015   PHYSICAL THERAPY DISCHARGE SUMMARY  Visits from Start of Care: 7 Current functional level related to goals / functional outcomes: Currently, pt has demonstrated an ability to ambulate on varying surfaces indoors and outdoors including pavement, grass, gravel, inclines, declines, and curbs all with no assistance needed at distances necessary for him to perform ADLs at home and in the community including work related activities.  His endurance and strength are all at levels necessary to warrant d/c from therapy.   Remaining deficits: Pt does not currently present with any significant deficits that would cause any concern for his safety.   Education / Equipment: Pt educated on the signs and symptoms related to CVA in order for pt to be able to recognize and seek medical attention.  Pt also educated on HEP and fitness plan that will enable him to stay healthy and maintain an active lifestyle. Plan: Patient agrees to discharge.  Patient goals were met. Patient is being discharged due to meeting the stated rehab goals.  ?????        Rosezella Rumpf, SPT  Name: Eric Patterson MRN: [id number] Date of Birth: [birth year]

## 2016-02-22 ENCOUNTER — Other Ambulatory Visit: Payer: Self-pay | Admitting: Internal Medicine

## 2016-02-24 ENCOUNTER — Ambulatory Visit (INDEPENDENT_AMBULATORY_CARE_PROVIDER_SITE_OTHER): Payer: 59 | Admitting: Neurology

## 2016-02-24 ENCOUNTER — Encounter: Payer: Self-pay | Admitting: Neurology

## 2016-02-24 VITALS — BP 122/87 | HR 83 | Ht 66.0 in | Wt 173.0 lb

## 2016-02-24 DIAGNOSIS — I639 Cerebral infarction, unspecified: Secondary | ICD-10-CM

## 2016-02-24 DIAGNOSIS — I6381 Other cerebral infarction due to occlusion or stenosis of small artery: Secondary | ICD-10-CM

## 2016-02-24 NOTE — Patient Instructions (Addendum)
I had a long d/w patient about his recent lacunar stroke, risk for recurrent stroke/TIAs, personally independently reviewed imaging studies and stroke evaluation results and answered questions.Continue aspirin 81 mg daily  for secondary stroke prevention and maintain strict control of hypertension with blood pressure goal below 130/90, diabetes with hemoglobin A1c goal below 6.5% and lipids with LDL cholesterol goal below 70 mg/dL. I also advised the patient to eat a healthy diet with plenty of whole grains, cereals, fruits and vegetables, exercise regularly and maintain ideal body weight. I advised him to have follow-up lipid profile checked at his next upcoming visit with his primary physician Dr Parke SimmersBland on 03/12/16. Followup in the future with my nurse practitioner in 6 months or call earlier if necessary  Stroke Prevention Some medical conditions and behaviors are associated with an increased chance of having a stroke. You may prevent a stroke by making healthy choices and managing medical conditions. HOW CAN I REDUCE MY RISK OF HAVING A STROKE?   Stay physically active. Get at least 30 minutes of activity on most or all days.  Do not smoke. It may also be helpful to avoid exposure to secondhand smoke.  Limit alcohol use. Moderate alcohol use is considered to be:  No more than 2 drinks per day for men.  No more than 1 drink per day for nonpregnant women.  Eat healthy foods. This involves:  Eating 5 or more servings of fruits and vegetables a day.  Making dietary changes that address high blood pressure (hypertension), high cholesterol, diabetes, or obesity.  Manage your cholesterol levels.  Making food choices that are high in fiber and low in saturated fat, trans fat, and cholesterol may control cholesterol levels.  Take any prescribed medicines to control cholesterol as directed by your health care provider.  Manage your diabetes.  Controlling your carbohydrate and sugar intake is  recommended to manage diabetes.  Take any prescribed medicines to control diabetes as directed by your health care provider.  Control your hypertension.  Making food choices that are low in salt (sodium), saturated fat, trans fat, and cholesterol is recommended to manage hypertension.  Ask your health care provider if you need treatment to lower your blood pressure. Take any prescribed medicines to control hypertension as directed by your health care provider.  If you are 3618-49 years of age, have your blood pressure checked every 3-5 years. If you are 49 years of age or older, have your blood pressure checked every year.  Maintain a healthy weight.  Reducing calorie intake and making food choices that are low in sodium, saturated fat, trans fat, and cholesterol are recommended to manage weight.  Stop drug abuse.  Avoid taking birth control pills.  Talk to your health care provider about the risks of taking birth control pills if you are over 49 years old, smoke, get migraines, or have ever had a blood clot.  Get evaluated for sleep disorders (sleep apnea).  Talk to your health care provider about getting a sleep evaluation if you snore a lot or have excessive sleepiness.  Take medicines only as directed by your health care provider.  For some people, aspirin or blood thinners (anticoagulants) are helpful in reducing the risk of forming abnormal blood clots that can lead to stroke. If you have the irregular heart rhythm of atrial fibrillation, you should be on a blood thinner unless there is a good reason you cannot take them.  Understand all your medicine instructions.  Make sure that other  conditions (such as anemia or atherosclerosis) are addressed. SEEK IMMEDIATE MEDICAL CARE IF:   You have sudden weakness or numbness of the face, arm, or leg, especially on one side of the body.  Your face or eyelid droops to one side.  You have sudden confusion.  You have trouble speaking  (aphasia) or understanding.  You have sudden trouble seeing in one or both eyes.  You have sudden trouble walking.  You have dizziness.  You have a loss of balance or coordination.  You have a sudden, severe headache with no known cause.  You have new chest pain or an irregular heartbeat. Any of these symptoms may represent a serious problem that is an emergency. Do not wait to see if the symptoms will go away. Get medical help at once. Call your local emergency services (911 in [place] Do not drive yourself to the hospital.   This information is not intended to replace advice given to you by your health care provider. Make sure you discuss any questions you have with your health care provider.   Document Released: 06/10/2004 Document Revised: 05/24/2014 Document Reviewed: 11/03/2012 Elsevier Interactive Patient Education [employer].

## 2016-02-24 NOTE — Progress Notes (Signed)
[facility] [address] [place]. [place] [id number] [phone]       OFFICE FOLLOW-UP NOTE  [name] Date of Birth:  11/12/1965 Medical Record Number:  [id number]   HPI: [name] is a 50 year old African-American male seen today for first office follow-up visit for hospital admission for stroke in July 2017.Eric Patterson is an 50 y.o. male with known HTN, smokes and drinks excessive ETOH. He ran out of one of his HTN medications yesterday but does not have a PCP to fill his Rx. He went to sleep at 2000 hours and felt normal. Awoke this AM and felt his left leg was weak and he had ortho stasis. The reason he came to ED was due to his orthostasis. MRI brain was obtained and confirmed a right small vessel stroke. He admits to not taking ASA daily.  Date last known well: Yesterday Time last known well: Time: 20:00 tPA Given: No: out of window. CT scan of the head on admission was unremarkable but MRI scan showed an acute infarct involving the right posterior corona radiata and external capsule. MRA of the brain showed moderate narrowing of the M2 segments of the left MCA and distal stenosis involving ACA and MCA. CT angiogram shows similar findings. Carotid ultrasound showed 40-59% distal right ICA stenosis. LDL cholesterol elevated at 95 mg percent and hemoglobin A1c was 5.3. Transthoracic echo showed normal ejection fraction. Patient was seen by physical and occupation therapy. He states his done well since discharge is finished outpatient therapies. Is independent and neck to do daily living. His left leg does stiffen up and occasionally Robaxin is tired. He is tolerating aspirin well without bleeding or bruising. He states her blood pressure is well controlled and today it is 1-2/83. Tolerating Lipitor well without muscle aches and pains. He has quit smoking completely as it has cut back his alcohol drinking significantly as well. He plans to have follow-up lipid profile  checked at upcoming visit with his primary physician Dr. Parke Patterson on 03/12/16.   ROS:   14 system review of systems is positive for left leg heaviness, and dragging, gait difficulty and all other systems negative  PMH:  Past Medical History:  Diagnosis Date  . Hypertension   . Stroke [facility](HCC)     Social History:  Social History   Social History  . Marital status: Married    Spouse name: N/A  . Number of children: N/A  . Years of education: N/A   Occupational History  . Not on file.   Social History Main Topics  . Smoking status: Former Smoker    Packs/day: 0.50    Types: Cigarettes    Quit date: 11/28/2015  . Smokeless tobacco: Never Used  . Alcohol use No  . Drug use: No  . Sexual activity: Not on file   Other Topics Concern  . Not on file   Social History Narrative  . No narrative on file    Medications:   Current Outpatient Prescriptions on File Prior to Visit  Medication Sig Dispense Refill  . acetaminophen (TYLENOL) 500 MG tablet Take 500 mg by mouth every 6 (six) hours as needed (pain).    Marland Kitchen. amLODipine (NORVASC) 10 MG tablet Take 1 tablet (10 mg total) by mouth daily. 30 tablet 0  . aspirin 81 MG EC tablet Take 1 tablet (81 mg total) by mouth daily. 30 tablet 2  . atorvastatin (LIPITOR) 80 MG tablet Take 1 tablet (80 mg total) by mouth daily  at 6 PM. 30 tablet 2  . EPINEPHrine 0.3 mg/0.3 mL IJ SOAJ injection Inject 0.3 mLs (0.3 mg total) into the muscle once. 1 Device 1  . telmisartan-hydrochlorothiazide (MICARDIS HCT) 80-25 MG tablet Take 1 tablet by mouth daily.    . [DISCONTINUED] lisinopril-hydrochlorothiazide (PRINZIDE,ZESTORETIC) 20-25 MG per tablet Take 1 tablet by mouth daily. (Patient not taking: Reported on 02/10/2015) 30 tablet 2   No current facility-administered medications on file prior to visit.     Allergies:  No Known Allergies  Physical Exam General: well developed, well nourished Middle aged African-American male, seated, in no evident  distress Head: head normocephalic and atraumatic.  Neck: supple with no carotid or supraclavicular bruits Cardiovascular: regular rate and rhythm, no murmurs Musculoskeletal: no deformity Skin:  no rash/petichiae Vascular:  Normal pulses all extremities Vitals:   02/24/16 1459  BP: 122/87  Pulse: 83   Neurologic Exam Mental Status: Awake and fully alert. Oriented to place and time. Recent and remote memory intact. Attention span, concentration and fund of knowledge appropriate. Mood and affect appropriate.  Cranial Nerves: Fundoscopic exam reveals sharp disc margins. Pupils equal, briskly reactive to light. Extraocular movements full without nystagmus. Visual fields full to confrontation. Hearing intact. Facial sensation intact. Face, tongue, palate moves normally and symmetrically.  Motor: Normal bulk and tone. Normal strength in all tested extremity muscles.Mild diminished fine motor movements on the left. Orbits right over left approximately. Mild weakness of left hip flexors and ankle dorsiflexors only.Diminished fine finger movements on the left. Orbits right over left approximately. Sensory.: intact to touch ,pinprick .position and vibratory sensation.  Coordination: Rapid alternating movements normal in all extremities. Finger-to-nose and heel-to-shin performed accurately bilaterally. Gait and Station: Arises from chair without difficulty. Stance is normal. Gait demonstrates normal stride length and balance . Able to heel, toe and tandem walk with slight difficulty.  Reflexes: 1+ and symmetric. Toes downgoing.   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 Multiple vascular risk factors of smoking, alcohol abuse, hyperlipidemia and hypertension.    PLAN: I had a long d/w patient about his recent lacunar stroke,  risk for recurrent stroke/TIAs, personally independently reviewed imaging studies and stroke evaluation results and answered questions.Continue aspirin 81 mg daily  for secondary stroke prevention and maintain strict control of hypertension with blood pressure goal below 130/90, diabetes with hemoglobin A1c goal below 6.5% and lipids with LDL cholesterol goal below 70 mg/dL. I also advised the patient to eat a healthy diet with plenty of whole grains, cereals, fruits and vegetables, exercise regularly and maintain ideal body weight. I advised him to have follow-up lipid profile checked at his next upcoming visit with his primary physician Dr Eric Simmers on 03/12/16. Followup in the future with my nurse practitioner in 6 months or call earlier if necessary Greater than 50% of time during this 25 minute visit was spent on counseling,explanation of diagnosis, planning of further management, discussion with patient and family and coordination of care Delia Heady, MD  [facility] [address] [place], [place] [address]  Phone [phone] Fax [phone] Note: This document was prepared with digital dictation and possible smart phrase technology. Any transcriptional errors that result from this process are unintentional

## 2016-08-24 ENCOUNTER — Ambulatory Visit: Payer: 59 | Admitting: Nurse Practitioner

## 2016-08-25 ENCOUNTER — Encounter: Payer: Self-pay | Admitting: Nurse Practitioner

## 2016-10-21 ENCOUNTER — Ambulatory Visit (INDEPENDENT_AMBULATORY_CARE_PROVIDER_SITE_OTHER): Payer: 59 | Admitting: Family

## 2016-10-21 ENCOUNTER — Encounter: Payer: Self-pay | Admitting: Family

## 2016-10-21 VITALS — BP 138/84 | HR 77 | Temp 97.7°F | Resp 14 | Ht 66.0 in | Wt 172.4 lb

## 2016-10-21 DIAGNOSIS — G8929 Other chronic pain: Secondary | ICD-10-CM | POA: Diagnosis not present

## 2016-10-21 DIAGNOSIS — I639 Cerebral infarction, unspecified: Secondary | ICD-10-CM | POA: Diagnosis not present

## 2016-10-21 DIAGNOSIS — I10 Essential (primary) hypertension: Secondary | ICD-10-CM | POA: Diagnosis not present

## 2016-10-21 DIAGNOSIS — E785 Hyperlipidemia, unspecified: Secondary | ICD-10-CM

## 2016-10-21 DIAGNOSIS — M545 Low back pain, unspecified: Secondary | ICD-10-CM | POA: Insufficient documentation

## 2016-10-21 DIAGNOSIS — I6381 Other cerebral infarction due to occlusion or stenosis of small artery: Secondary | ICD-10-CM

## 2016-10-21 NOTE — Patient Instructions (Signed)
Thank you for choosing [facility].  SUMMARY AND INSTRUCTIONS:  Follow a low sodium diet - less than 3 grams per day   Continue to monitor your blood pressure at home.   Please come by the lab to have your blood work completed when fasting.  For your back - stretches and exercises daily.  Ice as needed after work and activity.   Ibuprofen, Aleve or Tylenol as needed for discomfort.   Continue to take your medication as prescribed.     Labs:  Please stop by the lab on the lower level of the building for your blood work. Your results will be released to MyChart (or called to you) after review, usually within 72 hours after test completion. If any changes need to be made, you will be notified at that same time.  1.) The lab is open from 7:30am to 5:30 pm Monday-Friday 2.) No appointment is necessary 3.) Fasting (if needed) is 6-8 hours after food and drink; black coffee and water are okay   Follow up:  If your symptoms worsen or fail to improve, please contact our office for further instruction, or in case of emergency go directly to the emergency room at the closest medical facility.    Piriformis Syndrome Rehab Ask your health care provider which exercises are safe for you. Do exercises exactly as told by your health care provider and adjust them as directed. It is normal to feel mild stretching, pulling, tightness, or discomfort as you do these exercises, but you should stop right away if you feel sudden pain or your pain gets worse.Do not begin these exercises until told by your health care provider. Stretching and range of motion exercises These exercises warm up your muscles and joints and improve the movement and flexibility of your hip and pelvis. These exercises also help to relieve pain, numbness, and tingling. Exercise A: Hip rotators  1. Lie on your back on a firm surface. 2. Pull your left / right knee toward your same shoulder with your left / right hand  until your knee is pointing toward the ceiling. Hold your left / right ankle with your other hand. 3. Keeping your knee steady, gently pull your left / right ankle toward your other shoulder until you feel a stretch in your buttocks. 4. Hold this position for __________ seconds. Repeat __________ times. Complete this stretch __________ times a day. Exercise B: Hip extensors 1. Lie on your back on a firm surface. Both of your legs should be straight. 2. Pull your left / right knee to your chest. Hold your leg in this position by holding onto the back of your thigh or the front of your knee. 3. Hold this position for __________ seconds. 4. Slowly return to the starting position. Repeat __________ times. Complete this stretch __________ times a day. Strengthening exercises These exercises build strength and endurance in your hip and thigh muscles. Endurance is the ability to use your muscles for a long time, even after they get tired. Exercise C: Straight leg raises ( hip abductors) 1. Lie on your side with your left / right leg in the top position. Lie so your head, shoulder, knee, and hip line up. Bend your bottom knee to help you balance. 2. Lift your top leg up 4-6 inches (10-15 cm), keeping your toes pointed straight ahead. 3. Hold this position for __________ seconds. 4. Slowly lower your leg to the starting position. Let your muscles relax completely. Repeat __________ times. Complete this  exercise__________ times a day. Exercise D: Hip abductors and rotators, quadruped  1. Get on your hands and knees on a firm, lightly padded surface. Your hands should be directly below your shoulders, and your knees should be directly below your hips. 2. Lift your left / right knee out to the side. Keep your knee bent. Do not twist your body. 3. Hold this position for __________ seconds. 4. Slowly lower your leg. Repeat __________ times. Complete this exercise__________ times a day. Exercise E:  Straight leg raises ( hip extensors) 1. Lie on your abdomen on a bed or a firm surface with a pillow under your hips. 2. Squeeze your buttock muscles and lift your left / right thigh off the bed. Do not let your back arch. 3. Hold this position for __________ seconds. 4. Slowly return to the starting position. Let your muscles relax completely before doing another repetition. Repeat __________ times. Complete this exercise__________ times a day. This information is not intended to replace advice given to you by your health care provider. Make sure you discuss any questions you have with your health care provider. Document Released: 05/03/2005 Document Revised: 01/06/2016 Document Reviewed: 04/15/2015 Elsevier Interactive Patient Education  2018 [employer].   Low Back Sprain Rehab Ask your health care provider which exercises are safe for you. Do exercises exactly as told by your health care provider and adjust them as directed. It is normal to feel mild stretching, pulling, tightness, or discomfort as you do these exercises, but you should stop right away if you feel sudden pain or your pain gets worse. Do not begin these exercises until told by your health care provider. Stretching and range of motion exercises These exercises warm up your muscles and joints and improve the movement and flexibility of your back. These exercises also help to relieve pain, numbness, and tingling. Exercise A: Lumbar rotation  1. Lie on your back on a firm surface and bend your knees. 2. Straighten your arms out to your sides so each arm forms an "L" shape with a side of your body (a 90 degree angle). 3. Slowly move both of your knees to one side of your body until you feel a stretch in your lower back. Try not to let your shoulders move off of the floor. 4. Hold for __________ seconds. 5. Tense your abdominal muscles and slowly move your knees back to the starting position. 6. Repeat this exercise on the  other side of your body. Repeat __________ times. Complete this exercise __________ times a day. Exercise B: Prone extension on elbows  1. Lie on your abdomen on a firm surface. 2. Prop yourself up on your elbows. 3. Use your arms to help lift your chest up until you feel a gentle stretch in your abdomen and your lower back. ? This will place some of your body weight on your elbows. If this is uncomfortable, try stacking pillows under your chest. ? Your hips should stay down, against the surface that you are lying on. Keep your hip and back muscles relaxed. 4. Hold for __________ seconds. 5. Slowly relax your upper body and return to the starting position. Repeat __________ times. Complete this exercise __________ times a day. Strengthening exercises These exercises build strength and endurance in your back. Endurance is the ability to use your muscles for a long time, even after they get tired. Exercise C: Pelvic tilt 1. Lie on your back on a firm surface. Bend your knees and keep your feet  flat. 2. Tense your abdominal muscles. Tip your pelvis up toward the ceiling and flatten your lower back into the floor. ? To help with this exercise, you may place a small towel under your lower back and try to push your back into the towel. 3. Hold for __________ seconds. 4. Let your muscles relax completely before you repeat this exercise. Repeat __________ times. Complete this exercise __________ times a day. Exercise D: Alternating arm and leg raises  1. Get on your hands and knees on a firm surface. If you are on a hard floor, you may want to use padding to cushion your knees, such as an exercise mat. 2. Line up your arms and legs. Your hands should be below your shoulders, and your knees should be below your hips. 3. Lift your left leg behind you. At the same time, raise your right arm and straighten it in front of you. ? Do not lift your leg higher than your hip. ? Do not lift your arm higher  than your shoulder. ? Keep your abdominal and back muscles tight. ? Keep your hips facing the ground. ? Do not arch your back. ? Keep your balance carefully, and do not hold your breath. 4. Hold for __________ seconds. 5. Slowly return to the starting position and repeat with your right leg and your left arm. Repeat __________ times. Complete this exercise __________ times a day. Exercise E: Abdominal set with straight leg raise  1. Lie on your back on a firm surface. 2. Bend one of your knees and keep your other leg straight. 3. Tense your abdominal muscles and lift your straight leg up, 4-6 inches (10-15 cm) off the ground. 4. Keep your abdominal muscles tight and hold for __________ seconds. ? Do not hold your breath. ? Do not arch your back. Keep it flat against the ground. 5. Keep your abdominal muscles tense as you slowly lower your leg back to the starting position. 6. Repeat with your other leg. Repeat __________ times. Complete this exercise __________ times a day. Posture and body mechanics  Body mechanics refers to the movements and positions of your body while you do your daily activities. Posture is part of body mechanics. Good posture and healthy body mechanics can help to relieve stress in your body's tissues and joints. Good posture means that your spine is in its natural S-curve position (your spine is neutral), your shoulders are pulled back slightly, and your head is not tipped forward. The following are general guidelines for applying improved posture and body mechanics to your everyday activities. Standing   When standing, keep your spine neutral and your feet about hip-width apart. Keep a slight bend in your knees. Your ears, shoulders, and hips should line up.  When you do a task in which you stand in one place for a long time, place one foot up on a stable object that is 2-4 inches (5-10 cm) high, such as a footstool. This helps keep your spine  neutral. Sitting   When sitting, keep your spine neutral and keep your feet flat on the floor. Use a footrest, if necessary, and keep your thighs parallel to the floor. Avoid rounding your shoulders, and avoid tilting your head forward.  When working at a desk or a computer, keep your desk at a height where your hands are slightly lower than your elbows. Slide your chair under your desk so you are close enough to maintain good posture.  When working at a computer,  place your monitor at a height where you are looking straight ahead and you do not have to tilt your head forward or downward to look at the screen. Resting   When lying down and resting, avoid positions that are most painful for you.  If you have pain with activities such as sitting, bending, stooping, or squatting (flexion-based activities), lie in a position in which your body does not bend very much. For example, avoid curling up on your side with your arms and knees near your chest (fetal position).  If you have pain with activities such as standing for a long time or reaching with your arms (extension-based activities), lie with your spine in a neutral position and bend your knees slightly. Try the following positions:  Lying on your side with a pillow between your knees.  Lying on your back with a pillow under your knees. Lifting   When lifting objects, keep your feet at least shoulder-width apart and tighten your abdominal muscles.  Bend your knees and hips and keep your spine neutral. It is important to lift using the strength of your legs, not your back. Do not lock your knees straight out.  Always ask for help to lift heavy or awkward objects. This information is not intended to replace advice given to you by your health care provider. Make sure you discuss any questions you have with your health care provider. Document Released: 05/03/2005 Document Revised: 01/08/2016 Document Reviewed: 02/12/2015 Elsevier  Interactive Patient Education  [employer]2018 [employer]
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
## 2016-10-21 NOTE — Assessment & Plan Note (Signed)
Obtain lipid profile. Continue with lifestyle management pending lipid profile results. Goal is LDL of <70.

## 2016-10-21 NOTE — Assessment & Plan Note (Signed)
Blood pressure appears adequately controlled with current medication regimen and no adverse side effects. Encouraged to decrease sodium intake and follow a low sodium diet. Continue current dosage of telmisartan-hydrochlorothiazide. Denies worst headache of life with no symptoms of end organ damage. Continue to monitor.

## 2016-10-21 NOTE — Assessment & Plan Note (Signed)
Stable and continues to function well. Continue to monitor through risk factor reduction. Obtain lipid profile to check current LDL levels. Blood pressure appear reasonably controlled. Encouraged to follow a nutritional intake that is moderate, balanced and varied. Continue to monitor.

## 2016-10-21 NOTE — Assessment & Plan Note (Signed)
Low back / hip pain with no significant findings on exam and no pain able to be elicited. Does have mild muscle imbalances. Recommend conservative treatment with ice/moist heat, home exercise therapy and OTC medications as needed. Follow up if symptoms worsen.

## 2016-11-05 ENCOUNTER — Other Ambulatory Visit: Payer: Self-pay

## 2016-11-05 NOTE — Telephone Encounter (Signed)
LM checking pharmacy patient wants Telmisartan to go to. [facility]

## 2016-11-08 NOTE — Telephone Encounter (Signed)
[facility]

## 2016-11-08 NOTE — Telephone Encounter (Signed)
routiing to greg, this rx was originally sent by dr patel---are you ok with refill---please advise, I will call patient back if needed, thanks

## 2016-11-08 NOTE — Telephone Encounter (Signed)
Please check medication list with patient as she is on a similar medication. Thanks.

## 2016-11-09 ENCOUNTER — Telehealth: Payer: Self-pay

## 2016-11-09 NOTE — Telephone Encounter (Signed)
Called pt in regards to a [facility] I [facility] for [facility] refill of telmisartan-HCTZ. LVM for pt asking if he was using this pharmacy due to scams.

## 2016-11-10 MED ORDER — TELMISARTAN-HCTZ 80-25 MG PO TABS: 1.0000 | ORAL_TABLET | Freq: Every day | ORAL | 1 refills | Status: DC

## 2016-11-10 NOTE — Telephone Encounter (Signed)
Patient's lisinopril has been marked off her list, ok to fill micardis per greg

## 2016-11-24 NOTE — Telephone Encounter (Signed)
This pt is using this pharmacy. Can this be sent over for him?

## 2016-11-25 MED ORDER — TELMISARTAN-HCTZ 80-25 MG PO TABS: 1.0000 | ORAL_TABLET | Freq: Every day | ORAL | 1 refills | Status: DC

## 2016-11-25 NOTE — Addendum Note (Signed)
Addended by: Deatra JamesBRAND, LUCY M on: 11/25/2016 10:34 AM   Modules accepted: Orders

## 2016-11-25 NOTE — Telephone Encounter (Signed)
Updated pharmacy [facility]...Raechel Chute/lmb

## 2016-12-01 ENCOUNTER — Emergency Department (HOSPITAL_COMMUNITY): Payer: Self-pay

## 2016-12-01 ENCOUNTER — Emergency Department (HOSPITAL_COMMUNITY)
Admission: EM | Admit: 2016-12-01 | Discharge: 2016-12-01 | Disposition: A | Discharge status: Home / Self Care | Payer: Self-pay | Attending: Emergency Medicine | Admitting: Emergency Medicine

## 2016-12-01 ENCOUNTER — Encounter (HOSPITAL_COMMUNITY): Payer: Self-pay | Admitting: *Deleted

## 2016-12-01 DIAGNOSIS — Y9389 Activity, other specified: Secondary | ICD-10-CM | POA: Insufficient documentation

## 2016-12-01 DIAGNOSIS — W268XXA Contact with other sharp object(s), not elsewhere classified, initial encounter: Secondary | ICD-10-CM | POA: Insufficient documentation

## 2016-12-01 DIAGNOSIS — S61021A Laceration with foreign body of right thumb without damage to nail, initial encounter: Secondary | ICD-10-CM

## 2016-12-01 DIAGNOSIS — Z79899 Other long term (current) drug therapy: Secondary | ICD-10-CM | POA: Insufficient documentation

## 2016-12-01 DIAGNOSIS — Z87891 Personal history of nicotine dependence: Secondary | ICD-10-CM | POA: Insufficient documentation

## 2016-12-01 DIAGNOSIS — S61011A Laceration without foreign body of right thumb without damage to nail, initial encounter: Secondary | ICD-10-CM | POA: Insufficient documentation

## 2016-12-01 DIAGNOSIS — I10 Essential (primary) hypertension: Secondary | ICD-10-CM | POA: Insufficient documentation

## 2016-12-01 DIAGNOSIS — Y929 Unspecified place or not applicable: Secondary | ICD-10-CM | POA: Insufficient documentation

## 2016-12-01 DIAGNOSIS — Y999 Unspecified external cause status: Secondary | ICD-10-CM | POA: Insufficient documentation

## 2016-12-01 MED ORDER — CEPHALEXIN 250 MG PO CAPS
500.0000 mg | ORAL_CAPSULE | Freq: Once | ORAL | Status: AC
  Administered 2016-12-01: 500 mg via ORAL
  Filled 2016-12-01: qty 2

## 2016-12-01 MED ORDER — CEPHALEXIN 500 MG PO CAPS: 500.0000 mg | ORAL_CAPSULE | Freq: Two times a day (BID) | ORAL | 0 refills | Status: DC

## 2016-12-01 MED ORDER — TETANUS-DIPHTH-ACELL PERTUSSIS 5-2.5-18.5 LF-MCG/0.5 IM SUSP
0.5000 mL | Freq: Once | INTRAMUSCULAR | Status: AC
  Administered 2016-12-01: 0.5 mL via INTRAMUSCULAR
  Filled 2016-12-01: qty 0.5

## 2016-12-01 MED ORDER — LIDOCAINE HCL 2 % IJ SOLN
20.0000 mL | Freq: Once | INTRAMUSCULAR | Status: AC
  Administered 2016-12-01: 400 mg
  Filled 2016-12-01: qty 20

## 2016-12-01 NOTE — Discharge Instructions (Signed)
Please read attached information. If you experience any new or worsening signs or symptoms please return to the emergency room for evaluation. Please follow-up with your primary care provider or specialist as discussed. Please use medication prescribed only as directed and discontinue taking if you have any concerning signs or symptoms.   °

## 2016-12-01 NOTE — ED Provider Notes (Signed)
[facility] Provider Note   CSN: [id number] Arrival date & time: 12/01/16  1500     History   Chief Complaint Chief Complaint  Patient presents with  . Extremity Laceration    HPI Eric Patterson is a 51 y.o. male.  HPI   51 year old male presents today with laceration to his right thumb.  Patient reports that he was at work when his right thumb made contact with the plate of the band saw.  He notes bleeding over the palmar aspect of the right thumb.  He denies a loss of sensation, reports full active range of motion both flexion and extension at the DIP.  Patient uncertain tetanus status.  He denies any other complicating features.  Patient reports he has a history of hypertension, notes that his blood pressures regularly elevated, has not taken his blood pressure medication today as it causes him to be groggy, normally takes at night.  He denies any signs of endorgan damage or comp gating features.  Past Medical History:  Diagnosis Date  . Hyperlipidemia   . Hypertension   . Stroke [facility])     Patient Active Problem List   Diagnosis Date Noted  . Hyperlipidemia LDL goal <70 10/21/2016  . Chronic left-sided low back pain without sciatica 10/21/2016  . Hypertension 11/28/2015  . Lacunar infarction (HCC) 11/28/2015    History reviewed. No pertinent surgical history.     Home Medications    Prior to Admission medications   Medication Sig Start Date End Date Taking? Authorizing Provider  acetaminophen (TYLENOL) 500 MG tablet Take 500 mg by mouth every 6 (six) hours as needed (pain).    [provider]  aspirin 81 MG EC tablet Take 1 tablet (81 mg total) by mouth daily. 11/30/15   Beather Arbour, MD  cephALEXin (KEFLEX) 500 MG capsule Take 1 capsule (500 mg total) by mouth 2 (two) times daily. 12/01/16   Leeasia Secrist, Tinnie Gens, PA-C  EPINEPHrine 0.3 mg/0.3 mL IJ SOAJ injection Inject 0.3 mLs (0.3 mg total) into the muscle once. 02/11/15   Urban Gibson, MD    telmisartan-hydrochlorothiazide (MICARDIS HCT) 80-25 MG tablet Take 1 tablet by mouth daily. 11/25/16   Veryl Speak, FNP    Family History Family History  Problem Relation Age of Onset  . Heart attack Mother   . Stroke Brother 20  . Stroke Other     Social History Social History  Substance Use Topics  . Smoking status: Former Smoker    Packs/day: 0.50    Types: Cigarettes    Quit date: 11/28/2015  . Smokeless tobacco: Never Used  . Alcohol use Yes     Comment: Occasional     Allergies   Patient has no known allergies.   Review of Systems Review of Systems  All other systems reviewed and are negative.   Physical Exam Updated Vital Signs BP (!) 208/112 (BP Location: Left Arm)   Pulse 78   Temp 98.1 F (36.7 C) (Oral)   Resp 18   SpO2 100%   Physical Exam  Constitutional: He is oriented to person, place, and time. He appears well-developed and well-nourished.  HENT:  Head: Normocephalic and atraumatic.  Eyes: Pupils are equal, round, and reactive to light. Conjunctivae are normal. Right eye exhibits no discharge. Left eye exhibits no discharge. No scleral icterus.  Neck: Normal range of motion. No JVD present. No tracheal deviation present.  Pulmonary/Chest: Effort normal. No stridor.  Musculoskeletal:  2 cm Laceration over the pad  of the right thumb around the DIP.  Patient has full flexion against resistance at the DIP.  No obvious foreign bodies bleeding controlled sensation intact.  Neurological: He is alert and oriented to person, place, and time. Coordination normal.  Psychiatric: He has a normal mood and affect. His behavior is normal. Judgment and thought content normal.  Nursing note and vitals reviewed.    ED Treatments / Results  Labs (all labs ordered are listed, but only abnormal results are displayed) Labs Reviewed - No data to display  EKG  EKG Interpretation None       Radiology Dg Finger Thumb Right  Result Date:  12/01/2016 CLINICAL DATA:  Recent band saw injury with thumb laceration, initial encounter EXAM: RIGHT THUMB 2+V COMPARISON:  None. FINDINGS: Soft tissue irregularity is noted along the pad of the right thumb. No acute bony abnormality is seen. 2 small metallic foreign bodies are identified consistent with the recent injury. IMPRESSION: Soft tissue injury with 2 small metallic foreign bodies. No definitive bony abnormality is seen. Electronically Signed   By: Alcide Clever M.D.   On: 12/01/2016 16:49    Procedures .Marland KitchenLaceration Repair Date/Time: 12/01/2016 6:36 PM Performed by: Curlene Dolphin, Markee Matera Authorized by: Curlene Dolphin, Emmaline Wahba   Consent:    Consent obtained:  Verbal   Consent given by:  Patient   Risks discussed:  Infection, pain, retained foreign body, tendon damage, vascular damage, poor wound healing, poor cosmetic result, need for additional repair and nerve damage   Alternatives discussed:  No treatment, delayed treatment, observation and referral Anesthesia (see MAR for exact dosages):    Anesthesia method:  Topical application and local infiltration   Local anesthetic:  Lidocaine 2% w/o epi Laceration details:    Location:  Finger   Length (cm):  2 Pre-procedure details:    Preparation:  Patient was prepped and draped in usual sterile fashion Exploration:    Hemostasis achieved with:  Tourniquet   Wound exploration: wound explored through full range of motion and entire depth of wound probed and visualized     Wound extent: no fascia violation noted, no foreign bodies/material noted, no muscle damage noted, no nerve damage noted, no tendon damage noted, no underlying fracture noted and no vascular damage noted     Contaminated: no   Treatment:    Area cleansed with:  Betadine and saline   Amount of cleaning:  Extensive   Irrigation solution:  Tap water and sterile water   Visualized foreign bodies/material removed: no   Skin repair:    Repair method:  Sutures   Suture size:  4-0    Suture technique:  Simple interrupted   Number of sutures:  7 Post-procedure details:    Dressing:  Antibiotic ointment and non-adherent dressing   Patient tolerance of procedure:  Tolerated well, no immediate complications   (including critical care time)  Medications Ordered in ED Medications  cephALEXin (KEFLEX) capsule 500 mg (not administered)  lidocaine (XYLOCAINE) 2 % (with pres) injection 400 mg (400 mg Infiltration Given 12/01/16 1610)  Tdap (BOOSTRIX) injection 0.5 mL (0.5 mLs Intramuscular Given 12/01/16 1627)     Initial Impression / Assessment and Plan / ED Course  I have reviewed the triage vital signs and the nursing notes.  Pertinent labs & imaging results that were available during my care of the patient were reviewed by me and considered in my medical decision making (see chart for details).      Final Clinical Impressions(s) / ED Diagnoses  Final diagnoses:  Laceration of right thumb with foreign body without damage to nail, initial encounter    51 year old male presents today with laceration.  He has no signs of major vessel or tendon or ligamentous injury.  He has full range of motion of the thumb.  X-ray showed no signs of fracture.  He was noted to have foreign body in the soft tissue.  I extensively irrigated the wound, and extensively visualized the wound with no obvious foreign body.  Patient was working with plastic in a band saw which is not consistent with a foreign body that would be embedded.  I do not believe this is from today's event, I discussed with the patient that I was unable to visualize any foreign body.  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  Patient will be placed on antibiotics, wound was closed without complication, tetanus updated.  He will follow-up for suture removal, return for signs of infection.  Patient verbalized understanding and agreement to  today's plan had no further questions or concerns at time discharge.  New Prescriptions New Prescriptions   CEPHALEXIN (KEFLEX) 500 MG CAPSULE    Take 1 capsule (500 mg total) by mouth 2 (two) times daily.     Eyvonne MechanicHedges, Donel Osowski, PA-C 12/01/16 Jaye [id number]    Shaune PollackIsaacs, Cameron, MD 12/02/16 [id number]

## 2016-12-01 NOTE — ED Triage Notes (Signed)
Pt reports cutting right thumb on saw while at work. Has large laceration to thumb, bleeding controlled. Has numbness to fingertip. Hypertensive at triage, reports hx of same.

## 2016-12-06 ENCOUNTER — Ambulatory Visit (HOSPITAL_COMMUNITY)
Admission: EM | Admit: 2016-12-06 | Discharge: 2016-12-06 | Disposition: A | Discharge status: Other Acute Inpatient Hospital | Payer: 59

## 2017-09-06 ENCOUNTER — Ambulatory Visit: Payer: Self-pay | Admitting: Family Medicine

## 2018-01-09 ENCOUNTER — Encounter (HOSPITAL_COMMUNITY): Payer: Self-pay

## 2018-01-09 ENCOUNTER — Ambulatory Visit (HOSPITAL_COMMUNITY)
Admission: EM | Admit: 2018-01-09 | Discharge: 2018-01-09 | Disposition: A | Discharge status: Home / Self Care | Payer: 59 | Attending: Family Medicine | Admitting: Family Medicine

## 2018-01-09 DIAGNOSIS — S0501XA Injury of conjunctiva and corneal abrasion without foreign body, right eye, initial encounter: Secondary | ICD-10-CM

## 2018-01-09 MED ORDER — POLYMYXIN B-TRIMETHOPRIM 10000-0.1 UNIT/ML-% OP SOLN: 1.0000 [drp] | OPHTHALMIC | 0 refills | Status: DC

## 2018-01-09 MED ORDER — FLUORESCEIN SODIUM 1 MG OP STRP
ORAL_STRIP | OPHTHALMIC | Status: AC
  Filled 2018-01-09: qty 1

## 2018-01-09 MED ORDER — TELMISARTAN-HCTZ 80-25 MG PO TABS: 1.0000 | ORAL_TABLET | Freq: Every day | ORAL | 0 refills | Status: DC

## 2018-01-09 MED ORDER — TETRACAINE HCL 0.5 % OP SOLN
OPHTHALMIC | Status: AC
  Filled 2018-01-09: qty 4

## 2018-01-09 NOTE — ED Provider Notes (Signed)
[facility]    CSN: [id number] Arrival date & time: 01/09/18  6962     History   Chief Complaint Chief Complaint  Patient presents with  . Eye Problem    Right Eye    HPI Marwin Primmer is a 52 y.o. male.   Patient is a 52 year old male past medical history of hyperlipidemia, hypertension, stroke.  He presents today with 3 days of right eye irritation.  Reports that started Friday morning before he went to work.  He is unsure if he got anything in the eye.  Denies any injury to the eye.  He describes it as feeling of something in his eye.  He denies any vision changes, blurry vision.  He does not wear contacts. Denies any headaches, dizziness, numbness or tingling in the extremities.   Pt does smoke.  BP very elevated this am. Pt did not take his BP medication this am. Hx of CVA.   ROS per HPI        Past Medical History:  Diagnosis Date  . Hyperlipidemia   . Hypertension   . Stroke [facility])     Patient Active Problem List   Diagnosis Date Noted  . Hyperlipidemia LDL goal <70 10/21/2016  . Chronic left-sided low back pain without sciatica 10/21/2016  . Hypertension 11/28/2015  . Lacunar infarction (HCC) 11/28/2015    History reviewed. No pertinent surgical history.     Home Medications    Prior to Admission medications   Medication Sig Start Date End Date Taking? Authorizing Provider  acetaminophen (TYLENOL) 500 MG tablet Take 500 mg by mouth every 6 (six) hours as needed (pain).    [provider]  aspirin 81 MG EC tablet Take 1 tablet (81 mg total) by mouth daily. 11/30/15   Beather Arbour, MD  cephALEXin (KEFLEX) 500 MG capsule Take 1 capsule (500 mg total) by mouth 2 (two) times daily. 12/01/16   Hedges, Tinnie Gens, PA-C  EPINEPHrine 0.3 mg/0.3 mL IJ SOAJ injection Inject 0.3 mLs (0.3 mg total) into the muscle once. 02/11/15   Urban Gibson, MD  telmisartan-hydrochlorothiazide (MICARDIS HCT) 80-25 MG tablet Take 1 tablet by mouth daily.  01/09/18   Dahlia Byes A, NP  trimethoprim-polymyxin b (POLYTRIM) ophthalmic solution Place 1 drop into the right eye every 4 (four) hours. 01/09/18   Janace Aris, NP    Family History Family History  Problem Relation Age of Onset  . Heart attack Mother   . Stroke Brother 28  . Stroke Other     Social History Social History   Tobacco Use  . Smoking status: Former Smoker    Packs/day: 0.50    Types: Cigarettes    Last attempt to quit: 11/28/2015    Years since quitting: 2.1  . Smokeless tobacco: Never Used  Substance Use Topics  . Alcohol use: Yes    Comment: Occasional  . Drug use: No     Allergies   Patient has no known allergies.   Review of Systems Review of Systems   Physical Exam Triage Vital Signs ED Triage Vitals [01/09/18 0949]  Enc Vitals Group     BP (!) 209/98     Pulse Rate 95     Resp 18     Temp 98.2 F (36.8 C)     Temp Source Oral     SpO2 98 %     Weight      Height      Head Circumference  Peak Flow      Pain Score      Pain Loc      Pain Edu?      Excl. in GC?    No data found.  Updated Vital Signs BP (!) 206/110 (BP Location: Right Arm)   Pulse 79   Temp 98.2 F (36.8 C) (Oral)   Resp 18   SpO2 100%   Visual Acuity Right Eye Distance: 20/50 without corrective lens Left Eye Distance: 20/25 without corrective lens Bilateral Distance: 20/25 without corrective lens  Right Eye Near:   Left Eye Near:    Bilateral Near:     Physical Exam  Constitutional: He is oriented to person, place, and time. He appears well-developed and well-nourished.  HENT:  Head: Normocephalic and atraumatic.  Eyes: Pupils are equal, round, and reactive to light.    Mild injection to the right sclera with tearing.  Fluorescein eye exam done- Corneal abrasion noted at 5/6 o'clock just below the pupil. No foreign bodies observed.   Neck: Normal range of motion.  Pulmonary/Chest: Effort normal.  Musculoskeletal: Normal range of motion.    Neurological: He is alert and oriented to person, place, and time.  No focal neuro deficits.   Skin: Skin is warm and dry.  Psychiatric: He has a normal mood and affect.  Nursing note and vitals reviewed.    UC Treatments / Results  Labs (all labs ordered are listed, but only abnormal results are displayed) Labs Reviewed - No data to display  EKG None  Radiology No results found.  Procedures Procedures (including critical care time)  Medications Ordered in UC Medications - No data to display  Initial Impression / Assessment and Plan / UC Course  I have reviewed the triage vital signs and the nursing notes.  Pertinent labs & imaging results that were available during my care of the patient were reviewed by me and considered in my medical decision making (see chart for details).     Corneal abrasion noted at 5 and 6 o'clock just below the pupil.  Will give tobramycin eye drops. No focal neuro deficits on exam.  Also refilled BP meds.  Follow up as needed for continued or worsening symptoms  Final Clinical Impressions(s) / UC Diagnoses   Final diagnoses:  Abrasion of right cornea, initial encounter     Discharge Instructions     It was nice meeting you!!  Corneal abrasion to the right eye.  We will give you some eye drops to help.  I will also give you some blood pressure medication. Please take this as soon as you get it.     ED Prescriptions    Medication Sig Dispense Auth. Provider   trimethoprim-polymyxin b (POLYTRIM) ophthalmic solution Place 1 drop into the right eye every 4 (four) hours. 10 mL Suan Pyeatt A, NP   telmisartan-hydrochlorothiazide (MICARDIS HCT) 80-25 MG tablet Take 1 tablet by mouth daily. 30 tablet Dahlia ByesBast, Jimi Giza A, NP     Controlled Substance Prescriptions [place] Controlled Substance Registry consulted? Not Applicable   Janace ArisBast, Caydence Enck A, NP 01/09/18 1230

## 2018-01-09 NOTE — ED Triage Notes (Signed)
Pt presents with right eye irritation , pt believes he may have got something in his eye.

## 2018-01-09 NOTE — Discharge Instructions (Addendum)
It was nice meeting you!!  Corneal abrasion to the right eye.  We will give you some eye drops to help.  I will also give you some blood pressure medication. Please take this as soon as you get it.

## 2018-03-15 ENCOUNTER — Inpatient Hospital Stay (HOSPITAL_COMMUNITY)
Admission: EM | Admit: 2018-03-15 | Discharge: 2018-03-17 | DRG: 041 | Disposition: A | Discharge status: Home / Self Care | Payer: 59 | Attending: Internal Medicine | Admitting: Internal Medicine

## 2018-03-15 ENCOUNTER — Emergency Department (HOSPITAL_COMMUNITY): Payer: 59

## 2018-03-15 ENCOUNTER — Encounter (HOSPITAL_COMMUNITY): Payer: Self-pay | Admitting: Emergency Medicine

## 2018-03-15 DIAGNOSIS — Z720 Tobacco use: Secondary | ICD-10-CM

## 2018-03-15 DIAGNOSIS — E785 Hyperlipidemia, unspecified: Secondary | ICD-10-CM | POA: Diagnosis present

## 2018-03-15 DIAGNOSIS — Z9114 Patient's other noncompliance with medication regimen: Secondary | ICD-10-CM

## 2018-03-15 DIAGNOSIS — F17210 Nicotine dependence, cigarettes, uncomplicated: Secondary | ICD-10-CM | POA: Diagnosis present

## 2018-03-15 DIAGNOSIS — Z79899 Other long term (current) drug therapy: Secondary | ICD-10-CM

## 2018-03-15 DIAGNOSIS — R29810 Facial weakness: Secondary | ICD-10-CM | POA: Diagnosis present

## 2018-03-15 DIAGNOSIS — R29704 NIHSS score 4: Secondary | ICD-10-CM | POA: Diagnosis present

## 2018-03-15 DIAGNOSIS — I639 Cerebral infarction, unspecified: Principal | ICD-10-CM

## 2018-03-15 DIAGNOSIS — I10 Essential (primary) hypertension: Secondary | ICD-10-CM | POA: Diagnosis present

## 2018-03-15 DIAGNOSIS — Z7982 Long term (current) use of aspirin: Secondary | ICD-10-CM

## 2018-03-15 DIAGNOSIS — I161 Hypertensive emergency: Secondary | ICD-10-CM | POA: Diagnosis present

## 2018-03-15 DIAGNOSIS — Z8673 Personal history of transient ischemic attack (TIA), and cerebral infarction without residual deficits: Secondary | ICD-10-CM

## 2018-03-15 DIAGNOSIS — G8191 Hemiplegia, unspecified affecting right dominant side: Secondary | ICD-10-CM | POA: Diagnosis present

## 2018-03-15 LAB — COMPREHENSIVE METABOLIC PANEL
ALBUMIN: 4.3 g/dL (ref 3.5–5.0)
ALK PHOS: 77 U/L (ref 38–126)
ALT: 25 U/L (ref 0–44)
AST: 27 U/L (ref 15–41)
Anion gap: 8 (ref 5–15)
BILIRUBIN TOTAL: 1 mg/dL (ref 0.3–1.2)
BUN: 15 mg/dL (ref 6–20)
CALCIUM: 9.6 mg/dL (ref 8.9–10.3)
CO2: 23 mmol/L (ref 22–32)
Chloride: 107 mmol/L (ref 98–111)
Creatinine, Ser: 1.3 mg/dL — ABNORMAL HIGH (ref 0.61–1.24)
GFR calc Af Amer: >60 mL/min (ref >60)
GFR calc non Af Amer: >60 mL/min (ref >60)
GLUCOSE: 88 mg/dL (ref 70–99)
Potassium: 3.5 mmol/L (ref 3.5–5.1)
Sodium: 138 mmol/L (ref 135–145)
TOTAL PROTEIN: 8.5 g/dL — AB (ref 6.5–8.1)

## 2018-03-15 LAB — I-STAT CHEM 8, ED
BUN: 17 mg/dL (ref 6–20)
CREATININE: 1.4 mg/dL — AB (ref 0.61–1.24)
Calcium, Ion: 1.12 mmol/L — ABNORMAL LOW (ref 1.15–1.40)
Chloride: 107 mmol/L (ref 98–111)
Glucose, Bld: 92 mg/dL (ref 70–99)
HCT: 49 % (ref 39.0–52.0)
Hemoglobin: 16.7 g/dL (ref 13.0–17.0)
Potassium: 3.6 mmol/L (ref 3.5–5.1)
Sodium: 141 mmol/L (ref 135–145)
TCO2: 23 mmol/L (ref 22–32)

## 2018-03-15 LAB — CBC
HEMATOCRIT: 47.9 % (ref 39.0–52.0)
HEMOGLOBIN: 15.5 g/dL (ref 13.0–17.0)
MCH: 30 pg (ref 26.0–34.0)
MCHC: 32.4 g/dL (ref 30.0–36.0)
MCV: 92.6 fL (ref 80.0–100.0)
Platelets: 386 10*3/uL (ref 150–400)
RBC: 5.17 MIL/uL (ref 4.22–5.81)
RDW: 13.7 % (ref 11.5–15.5)
WBC: 7.1 10*3/uL (ref 4.0–10.5)
nRBC: 0 % (ref 0.0–0.2)

## 2018-03-15 LAB — DIFFERENTIAL
Abs Immature Granulocytes: 0.02 10*3/uL (ref 0.00–0.07)
Basophils Absolute: 0 10*3/uL (ref 0.0–0.1)
Basophils Relative: 1 %
EOS PCT: 3 %
Eosinophils Absolute: 0.2 10*3/uL (ref 0.0–0.5)
Immature Granulocytes: 0 %
LYMPHS ABS: 2.8 10*3/uL (ref 0.7–4.0)
Lymphocytes Relative: 39 %
MONO ABS: 0.6 10*3/uL (ref 0.1–1.0)
Monocytes Relative: 8 %
NEUTROS ABS: 3.5 10*3/uL (ref 1.7–7.7)
Neutrophils Relative %: 49 %

## 2018-03-15 LAB — PROTIME-INR
INR: 1.09
PROTHROMBIN TIME: 14 s (ref 11.4–15.2)

## 2018-03-15 LAB — CBG MONITORING, ED: GLUCOSE-CAPILLARY: 82 mg/dL (ref 70–99)

## 2018-03-15 LAB — I-STAT TROPONIN, ED: Troponin i, poc: 0.01 ng/mL (ref 0.00–0.08)

## 2018-03-15 LAB — APTT: aPTT: 33 seconds (ref 24–36)

## 2018-03-15 MED ORDER — ASPIRIN 325 MG PO TABS: 325.0000 mg | ORAL_TABLET | Freq: Every day | ORAL | Status: DC

## 2018-03-15 MED ORDER — ACETAMINOPHEN 650 MG RE SUPP: 650.0000 mg | RECTAL | Status: DC | PRN

## 2018-03-15 MED ORDER — ASPIRIN 300 MG RE SUPP: 300.0000 mg | Freq: Every day | RECTAL | Status: DC

## 2018-03-15 MED ORDER — SENNOSIDES-DOCUSATE SODIUM 8.6-50 MG PO TABS: 1.0000 | ORAL_TABLET | Freq: Every evening | ORAL | Status: DC | PRN

## 2018-03-15 MED ORDER — ACETAMINOPHEN 325 MG PO TABS: 650.0000 mg | ORAL_TABLET | ORAL | Status: DC | PRN

## 2018-03-15 MED ORDER — ATORVASTATIN CALCIUM 40 MG PO TABS
40.0000 mg | ORAL_TABLET | Freq: Every day | ORAL | Status: DC
  Administered 2018-03-16 – 2018-03-17 (×2): 40 mg via ORAL
  Filled 2018-03-15 (×2): qty 1

## 2018-03-15 MED ORDER — ENOXAPARIN SODIUM 40 MG/0.4ML ~~LOC~~ SOLN
40.0000 mg | SUBCUTANEOUS | Status: DC
  Administered 2018-03-16 – 2018-03-17 (×2): 40 mg via SUBCUTANEOUS
  Filled 2018-03-15 (×2): qty 0.4

## 2018-03-15 MED ORDER — ACETAMINOPHEN 160 MG/5ML PO SOLN: 650.0000 mg | ORAL | Status: DC | PRN

## 2018-03-15 MED ORDER — LABETALOL HCL 5 MG/ML IV SOLN
10.0000 mg | Freq: Once | INTRAVENOUS | Status: AC
  Administered 2018-03-15: 10 mg via INTRAVENOUS
  Filled 2018-03-15: qty 4

## 2018-03-15 MED ORDER — LORAZEPAM 2 MG/ML IJ SOLN
1.0000 mg | Freq: Once | INTRAMUSCULAR | Status: AC
  Administered 2018-03-15: 1 mg via INTRAVENOUS
  Filled 2018-03-15: qty 1

## 2018-03-15 MED ORDER — CLOPIDOGREL BISULFATE 75 MG PO TABS
75.0000 mg | ORAL_TABLET | Freq: Every day | ORAL | Status: DC
  Administered 2018-03-16 – 2018-03-17 (×2): 75 mg via ORAL
  Filled 2018-03-15 (×2): qty 1

## 2018-03-15 MED ORDER — STROKE: EARLY STAGES OF RECOVERY BOOK
Freq: Once | Status: AC
  Administered 2018-03-16: 01:00:00
  Filled 2018-03-15: qty 1

## 2018-03-15 MED ORDER — NICARDIPINE HCL IN NACL 20-0.86 MG/200ML-% IV SOLN
3.0000 mg/h | Freq: Once | INTRAVENOUS | Status: AC
  Administered 2018-03-15: 5 mg/h via INTRAVENOUS
  Filled 2018-03-15: qty 200

## 2018-03-15 NOTE — ED Notes (Signed)
Stopped Cardene drip per Dr. Lockie Mola

## 2018-03-15 NOTE — H&P (Signed)
History and Physical    Eric Patterson UJW:[id number] DOB: [birth year] DOA: 03/15/2018  PCP: Veryl Speak, FNP  Patient coming from: Home  I have personally briefly reviewed patient's old medical records in [facility]  Chief Complaint: Facial weakness and slurred speech  HPI: Eric Patterson is a 52 y.o. 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  He says his symptoms began suddenly while he was at work he noticed that his speech was slurred.  He says he has had worsening weakness in his right lower extremity compared to baseline for the last 2 days.  He denies any associated chest pain, palpitations, shortness of breath, headache, change in vision, lightheadedness, dizziness.  He admits to not taking his home medications for at least 1 week.  He is a current smoker of 1.5 PPD for years.  ED Course:  On arrival to the ED his vitals showed BP 239/124, pulse 90, respiratory rate 17, temp 97.40F, SPO2 100% on room air.  He was given IV labetalol 10 mg once and started on IV nicardipine drip for his blood pressure.  EKG showed normal sinus rhythm with LVH.  CT head without contrast showed a subacute to acute nonhemorrhagic infarct involving the left basal ganglia corona radiata with remote appearing small bilateral basal ganglial and right caudate body lacunar infarcts.  Neurology were consulted who recommended an MRI brain, MRA head.  This showed an acute ischemic nonhemorrhagic infarct involving the left basal ganglia/corona radiata without large vessel occlusion.  Review of Systems: As per HPI otherwise 10 point review of systems negative.    Past Medical History:  Diagnosis Date  . Hyperlipidemia   . Hypertension   . Stroke [facility]) 2017   R side weakness deficits     History reviewed. No pertinent surgical history.   reports that he has been smoking cigarettes. He has been smoking about 1.50 packs per day. He has never used smokeless tobacco. He reports that he drinks alcohol. He reports that he does not use drugs.  No Known Allergies  Family History  Problem Relation Age of Onset  . Heart attack Mother   . Stroke Brother 80  . Stroke Other      Prior to Admission medications   Medication Sig Start Date End Date Taking? Authorizing Provider  aspirin 81 MG EC tablet Take 1 tablet (81 mg total) by mouth daily. 11/30/15  Yes Mozes Sagar, Hal Morales, MD  telmisartan-hydrochlorothiazide (MICARDIS HCT) 80-25 MG tablet Take 1 tablet by mouth daily. 01/09/18  Yes Bast, Traci A, NP  cephALEXin (KEFLEX) 500 MG capsule Take 1 capsule (500 mg total) by mouth 2 (two) times daily. Patient not taking: Reported on 03/15/2018 12/01/16   Hedges, Tinnie Gens, PA-C  EPINEPHrine 0.3 mg/0.3 mL IJ SOAJ injection Inject 0.3 mLs (0.3 mg total) into the muscle once. Patient not taking: Reported on 03/15/2018 02/11/15   Urban Gibson, MD  trimethoprim-polymyxin b [facility]) ophthalmic solution Place 1 drop into the right eye every 4 (four) hours. Patient not taking: Reported on 03/15/2018 01/09/18   Janace Aris, NP    Physical Exam: Vitals:   03/15/18 2315 03/15/18 2330 03/15/18 2345 03/16/18 0052  BP: (!) 159/104 (!) 189/103 (!) 158/89 (!) 161/96  Pulse: 71 64 65 (!) 56  Resp: (!) 24 (!) 26 (!) 22  Temp:    97.7 F (36.5 C)  TempSrc:    Oral  SpO2: 99% 100% 99% 99%  Weight:      Height:        Constitutional: NAD, calm, comfortable Eyes: PERRL, EOMI, lids and conjunctivae normal ENMT: Mucous membranes are moist. Posterior pharynx clear of any exudate or lesions. Normal dentition.  Right facial droop, slight.. Neck: normal, supple, no masses, no thyromegaly Respiratory: clear to auscultation bilaterally, no wheezing, no crackles. Normal respiratory effort. No accessory  muscle use.  Cardiovascular: Regular rate and rhythm, no murmurs / rubs / gallops. No extremity edema. No carotid bruits.  Abdomen: no tenderness, no masses palpated. No hepatosplenomegaly. Bowel sounds positive.  Musculoskeletal: no clubbing / cyanosis. No joint deformity upper and lower extremities. Good ROM, no contractures. Normal muscle tone.  Skin: no rashes, lesions, ulcers. No induration Neurologic: Right facial droop and slight dysarthria.  Sensation intact, DTR normal. Strength 5/5 in all 4 extremities.  FNF intact bilaterally. H-S intact bilaterally. Psychiatric: Appears to have poor insight into his condition and long-term health.. Alert and oriented x 3. Normal mood.     Labs on Admission: I have personally reviewed following labs and imaging studies  CBC: Recent Labs  Lab 03/15/18 2008 03/15/18 2016  WBC 7.1  --   NEUTROABS 3.5  --   HGB 15.5 16.7  HCT 47.9 49.0  MCV 92.6  --   PLT 386  --    Basic Metabolic Panel: Recent Labs  Lab 03/15/18 2008 03/15/18 2016  NA 138 141  K 3.5 3.6  CL 107 107  CO2 23  --   GLUCOSE 88 92  BUN 15 17  CREATININE 1.30* 1.40*  CALCIUM 9.6  --    GFR: Estimated Creatinine Clearance: 61.8 mL/min (A) (by C-G formula based on SCr of 1.4 mg/dL (H)). Liver Function Tests: Recent Labs  Lab 03/15/18 2008  AST 27  ALT 25  ALKPHOS 77  BILITOT 1.0  PROT 8.5*  ALBUMIN 4.3   No results for input(s): LIPASE, AMYLASE in the last 168 hours. No results for input(s): AMMONIA in the last 168 hours. Coagulation Profile: Recent Labs  Lab 03/15/18 2008  INR 1.09   Cardiac Enzymes: No results for input(s): CKTOTAL, CKMB, CKMBINDEX, TROPONINI in the last 168 hours. BNP (last 3 results) No results for input(s): PROBNP in the last 8760 hours. HbA1C: No results for input(s): HGBA1C in the last 72 hours. CBG: Recent Labs  Lab 03/15/18 2002  GLUCAP 82   Lipid Profile: No results for input(s): CHOL, HDL, LDLCALC, TRIG, CHOLHDL,  LDLDIRECT in the last 72 hours. Thyroid Function Tests: No results for input(s): TSH, T4TOTAL, FREET4, T3FREE, THYROIDAB in the last 72 hours. Anemia Panel: No results for input(s): VITAMINB12, FOLATE, FERRITIN, TIBC, IRON, RETICCTPCT in the last 72 hours. Urine analysis:    Component Value Date/Time   LABSPEC 1.010 02/10/2015 2105   PHURINE 6.0 02/10/2015 2105   GLUCOSEU NEGATIVE 02/10/2015 2105   HGBUR TRACE (A) 02/10/2015 2105   BILIRUBINUR NEGATIVE 02/10/2015 2105   KETONESUR NEGATIVE 02/10/2015 2105   PROTEINUR NEGATIVE 02/10/2015 2105   UROBILINOGEN 1.0 02/10/2015 2105   NITRITE NEGATIVE 02/10/2015 2105   LEUKOCYTESUR NEGATIVE 02/10/2015 2105    Radiological Exams on Admission: Ct Head Wo Contrast  Result Date: 03/15/2018 CLINICAL DATA:  Right-sided facial droop EXAM: CT HEAD WITHOUT CONTRAST TECHNIQUE: Contiguous axial images were obtained from the base of the skull through the vertex without intravenous contrast. COMPARISON:  11/29/2015 FINDINGS: Brain: New area of hypodensity, age indeterminate but likely recent involving the left basal ganglia and corona radiata suspicious for a nonhemorrhagic infarct. Smaller hypodensities in the left basal ganglia and along the right caudate body have the appearance of more remote infarcts. No acute intracranial hemorrhage, midline shift or edema. No large vascular territory infarction. No hydrocephalus, intra or extra-axial mass or extra-axial fluid collections. Midline fourth ventricle basal cisterns. Vascular: No hyperdense vessel sign. Skull: No acute skull fracture. Sinuses/Orbits: No acute finding. Other: None. IMPRESSION: Age-indeterminate but more likely a subacute to acute nonhemorrhagic infarct involving left basal ganglia and corona radiata. More remote appearing small bilateral basal ganglial and right caudate body lacunar infarcts. Electronically Signed   By: Tollie Eth M.D.   On: 03/15/2018 21:08   Mr Brain Wo Contrast (neuro  Protocol)  Result Date: 03/15/2018 CLINICAL DATA:  Initial evaluation for acute right facial and right lower extremity weakness. EXAM: MRI HEAD WITHOUT CONTRAST MRA HEAD WITHOUT CONTRAST TECHNIQUE: Multiplanar, multiecho pulse sequences of the brain and surrounding structures were obtained without intravenous contrast. Angiographic images of the head were obtained using MRA technique without contrast. COMPARISON:  Prior CT from earlier the same day as well as previous MRIs from 11/28/2015. FINDINGS: MRI HEAD FINDINGS Brain: Cerebral volume within normal limits for age. Mild chronic microvascular ischemic changes present within the periventricular white matter. Small remote/chronic lacunar infarcts seen involving the posterior right corona radiata. Associated small amount of chronic hemosiderin staining. Probable tiny remote left thalamic lacunar infarct noted as well. Abnormal restricted diffusion measuring 2.4 x 1.5 x 2.4 cm seen involving the left basal ganglia, extending from the left lentiform nucleus into the caudate/corona radiata (series 3, image 34). No associated hemorrhage or mass effect. Finding corresponds with abnormality seen on prior CT. No other evidence for acute or subacute ischemia. Gray-white matter differentiation otherwise maintained. No evidence for acute intracranial hemorrhage. No mass lesion, midline shift or mass effect. No hydrocephalus. No extra-axial fluid collection. 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 Pituitary gland and sella otherwise unremarkable. Midline structures intact. Vascular: Major intracranial vascular flow voids maintained. Skull and upper cervical spine: And Landau occipital assimilation noted. Craniocervical junction otherwise unremarkable. No focal marrow replacing lesions. Scalp soft tissues unremarkable. Sinuses/Orbits: Globes orbital soft tissues within normal limits. Scattered mucosal thickening throughout the frontal,  ethmoidal, and maxillary sinuses. Superimposed air-fluid level at the right frontoethmoidal recess, suggesting acute sinusitis. No mastoid effusion. Inner ear structures normal. Other: None. MRA HEAD FINDINGS ANTERIOR CIRCULATION: Distal cervical segments of the internal carotid arteries are patent with antegrade flow. Petrous segments patent bilaterally without stenosis. Multifocal atheromatous irregularity throughout the cavernous/supraclinoid ICAs with resultant mild to moderate stenoses (up to approximately 50%), worse on the left. No made of a small 2 mm focal outpouching extending from the anterior genu of the cavernous left ICA, which could reflect a small aneurysm (series 4, image 75). ICA termini patent bilaterally. Right A1 dominant and widely patent. Absent and/or hypoplastic left A1, accounting for the diminutive left ICA is compared to the right. Normal anterior communicating artery. Anterior cerebral arteries demonstrate scattered multifocal atheromatous irregularity but are patent to their distal aspects without flow-limiting stenosis. Left M1 widely patent proximally, subsequently bifurcates early. Short-segment moderate proximal left M2 stenosis, inferior division (series 453, image 9). Right M1 demonstrates atheromatous irregularity but is patent without high-grade stenosis. Normal right MCA bifurcation. Distal small vessel atheromatous irregularity noted within the MCA branches bilaterally. POSTERIOR  CIRCULATION: Vertebral arteries diminutive but widely patent to the vertebrobasilar junction without stenosis. Right vertebral artery slightly dominant. Posterior inferior cerebral arteries not seen on this exam. Atheromatous irregularity with mild narrowing involving the mid and distal diminutive basilar artery. Superior cerebral arteries patent bilaterally. Predominant fetal type origin of the PCAs bilaterally, although a small hypoplastic left P1 segment noted. PCAs demonstrate multifocal  atheromatous irregularity without high-grade stenosis, right slightly worse than left. IMPRESSION: MRI HEAD IMPRESSION: 1. Acute ischemic nonhemorrhagic infarct involving the left basal ganglia/corona radiata. 2. Underlying mild chronic small vessel ischemic disease with small remote lacunar infarct involving the right posterior corona radiata. 3. Acute fronto ethmoidal sinusitis. MRA HEAD IMPRESSION: 1. Negative intracranial MRA for large vessel occlusion. 2. Mild-to-moderate atherosclerotic change involving both the anterior and posterior circulations without proximal high-grade or correctable stenosis, most notable about the carotid siphons. 3. Short-segment moderate proximal left M2 stenosis, similar to previous. 4. 2 mm focal outpouching arising from the cavernous left ICA as above, suspicious for a possible small aneurysm. Attention at follow-up recommended. 5. Hypoplastic/absent left A1 segment with associated diminutive left ICA as compared to the right. ACAs supplied via the right carotid artery system. 6. Predominant fetal type origin of the PCAs with secondary overall diminutive vertebrobasilar system. Electronically Signed   By: Rise Mu M.D.   On: 03/15/2018 22:42   Mr Shirlee Latch (cerebral Arteries)  Result Date: 03/15/2018 CLINICAL DATA:  Initial evaluation for acute right facial and right lower extremity weakness. EXAM: MRI HEAD WITHOUT CONTRAST MRA HEAD WITHOUT CONTRAST TECHNIQUE: Multiplanar, multiecho pulse sequences of the brain and surrounding structures were obtained without intravenous contrast. Angiographic images of the head were obtained using MRA technique without contrast. COMPARISON:  Prior CT from earlier the same day as well as previous MRIs from 11/28/2015. FINDINGS: MRI HEAD FINDINGS Brain: Cerebral volume within normal limits for age. Mild chronic microvascular ischemic changes present within the periventricular white matter. Small remote/chronic lacunar infarcts seen  involving the posterior right corona radiata. Associated small amount of chronic hemosiderin staining. Probable tiny remote left thalamic lacunar infarct noted as well. Abnormal restricted diffusion measuring 2.4 x 1.5 x 2.4 cm seen involving the left basal ganglia, extending from the left lentiform nucleus into the caudate/corona radiata (series 3, image 34). No associated hemorrhage or mass effect. Finding corresponds with abnormality seen on prior CT. No other evidence for acute or subacute ischemia. Gray-white matter differentiation otherwise maintained. No evidence for acute intracranial hemorrhage. No mass lesion, midline shift or mass effect. No hydrocephalus. No extra-axial fluid collection. 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 Pituitary gland and sella otherwise unremarkable. Midline structures intact. Vascular: Major intracranial vascular flow voids maintained. Skull and upper cervical spine: And Landau occipital assimilation noted. Craniocervical junction otherwise unremarkable. No focal marrow replacing lesions. Scalp soft tissues unremarkable. Sinuses/Orbits: Globes orbital soft tissues within normal limits. Scattered mucosal thickening throughout the frontal, ethmoidal, and maxillary sinuses. Superimposed air-fluid level at the right frontoethmoidal recess, suggesting acute sinusitis. No mastoid effusion. Inner ear structures normal. Other: None. MRA HEAD FINDINGS ANTERIOR CIRCULATION: Distal cervical segments of the internal carotid arteries are patent with antegrade flow. Petrous segments patent bilaterally without stenosis. Multifocal atheromatous irregularity throughout the cavernous/supraclinoid ICAs with resultant mild to moderate stenoses (up to approximately 50%), worse on the left. No made of a small 2 mm focal outpouching extending from the anterior genu of the cavernous left ICA, which could reflect a small aneurysm (series 4, image  75). ICA termini  patent bilaterally. Right A1 dominant and widely patent. Absent and/or hypoplastic left A1, accounting for the diminutive left ICA is compared to the right. Normal anterior communicating artery. Anterior cerebral arteries demonstrate scattered multifocal atheromatous irregularity but are patent to their distal aspects without flow-limiting stenosis. Left M1 widely patent proximally, subsequently bifurcates early. Short-segment moderate proximal left M2 stenosis, inferior division (series 453, image 9). Right M1 demonstrates atheromatous irregularity but is patent without high-grade stenosis. Normal right MCA bifurcation. Distal small vessel atheromatous irregularity noted within the MCA branches bilaterally. POSTERIOR CIRCULATION: Vertebral arteries diminutive but widely patent to the vertebrobasilar junction without stenosis. Right vertebral artery slightly dominant. Posterior inferior cerebral arteries not seen on this exam. Atheromatous irregularity with mild narrowing involving the mid and distal diminutive basilar artery. Superior cerebral arteries patent bilaterally. Predominant fetal type origin of the PCAs bilaterally, although a small hypoplastic left P1 segment noted. PCAs demonstrate multifocal atheromatous irregularity without high-grade stenosis, right slightly worse than left. IMPRESSION: MRI HEAD IMPRESSION: 1. Acute ischemic nonhemorrhagic infarct involving the left basal ganglia/corona radiata. 2. Underlying mild chronic small vessel ischemic disease with small remote lacunar infarct involving the right posterior corona radiata. 3. Acute fronto ethmoidal sinusitis. MRA HEAD IMPRESSION: 1. Negative intracranial MRA for large vessel occlusion. 2. Mild-to-moderate atherosclerotic change involving both the anterior and posterior circulations without proximal high-grade or correctable stenosis, most notable about the carotid siphons. 3. Short-segment moderate proximal left M2 stenosis, similar to  previous. 4. 2 mm focal outpouching arising from the cavernous left ICA as above, suspicious for a possible small aneurysm. Attention at follow-up recommended. 5. Hypoplastic/absent left A1 segment with associated diminutive left ICA as compared to the right. ACAs supplied via the right carotid artery system. 6. Predominant fetal type origin of the PCAs with secondary overall diminutive vertebrobasilar system. Electronically Signed   By: Rise Mu M.D.   On: 03/15/2018 22:42    EKG: Independently reviewed.  Normal sinus rhythm with LVH.  Assessment/Plan Principal Problem:   Infarction of left basal ganglia (HCC) Active Problems:   Hypertension   Hyperlipidemia LDL goal <70   Tobacco use   Eric Patterson is a 52 y.o. 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  He was found to have an acute ischemic infarct of his left basal ganglia/corona radiata.  Acute left basal ganglia/corona radiata ischemic infarction: MRI brain shows acute hemorrhagic infarction of the left basal ganglia/corona radiata.  MRA head/neck was without large vessel occlusion.  He has risk factors in prior stroke, hypertension, hyperlipidemia, tobacco, and alcohol use.  He is nonadherent to medication use.  He has continued right facial droop on exam.  He is admitted for continued stroke work-up. -TTE, carotid Dopplers -Start Plavix 75 mg daily, continue home aspirin 81 mg daily -Start atorvastatin 40 mg daily -Check hemoglobin A1c, lipid panel -Monitor on telemetry -Permissive hypertension <220/120 -PT/OT/SLP eval -He will need to work on risk factor reduction  Hypertension: Has not been taking home meds (telmisartan-HCTZ 80-25 mg daily) for at least 1 week.  BP was significantly elevated to 240/120 on  arrival and improved with nicardipine drip which has since been discontinued. -Hold home BP meds as above for permissive hypertension first 24 hours  Hyperlipidemia: -Start atorvastatin 40 mg daily -Lipid panel  Tobacco use: Smokes  1.5 PPD -Counseled on smoking cessation  DVT prophylaxis: Lovenox Code Status: Full code Family Communication: Discussed with patient, wife, and patient's parents Disposition Plan: Likely discharge to home versus SNF versus inpatient rehab pending PT/OT/SLP eval and stroke work-up Consults called: Neurology, Dr. Otelia Limes Admission status: Observation   Eric Mclean MD [facility] Pager [phone]  If 7PM-7AM, please contact night-coverage [email or web address] Password [email or web address]  03/16/2018, 2:24 AM

## 2018-03-15 NOTE — Consult Note (Signed)
Referring Physician: Dr. Posey Pronto    Chief Complaint: Right facial and RLE weakness  HPI: Eric Patterson is an 52 y.o. male who presents to the ED with new onset of right face weakness since 7 AM today. He also states that he has been dragging his RLE more than usual since Monday. He has a prior history of stroke with residual RLE weakness.   CT head in the ED revealed a subacute left putamen/caudate ischemic infarction.   LSN: 7 AM tPA Given: No: Out of time window.   Past Medical History:  Diagnosis Date  . Hyperlipidemia   . Hypertension   . Stroke [facility]) 2017   R side weakness deficits    History reviewed. No pertinent surgical history.  Family History  Problem Relation Age of Onset  . Heart attack Mother   . Stroke Brother 33  . Stroke Other    Social History:  reports that he has been smoking cigarettes. He has been smoking about 0.50 packs per day. He has never used smokeless tobacco. He reports that he drinks alcohol. He reports that he does not use drugs.  Allergies: No Known Allergies  Medications:  ASA 81 mg po qd Micardis-HCT  ROS: Denies fever, chills, vision changes, headache, neck pain, limb pain or chest pain. States that other than his neurological symptoms described above, he has no new symptoms.    Physical Examination: Blood pressure (!) 142/93, pulse 66, resp. rate 20, height 5' 6" (1.676 m), weight 79.4 kg, SpO2 95 %.  HEENT: [place]/AT Lungs: Respirations unlabored Ext: No edema  Neurologic Examination: Mental Status:  Alert, fully oriented except to year ("2020"). Pleasant and cooperative. Speech with mild dysarthria, but fluent. Comprehension, naming and repetition intact.  Cranial Nerves: II:  Visual fields intact with no extinction to DSS.  III,IV, VI: Mild left ptosis. EOM are full with saccadic visual pursuits. No nystagmus.  V,VII: Right facial droop. Temp sensation equal bilaterally.  VIII: hearing intact to conversation IX,X: No hypophonia XI:  Symmetric XII: midline tongue extension  Motor: Isometric resistance is 5/5 bilateral upper and lower extremities except for 4/5 right ankle plantar/dorsiflexion.  Mild lag with initiation of movements involving RLE and RUE.  Mildly increased tone bilateral lower extremities.  Sensory: Temp and light touch intact proximally in all 4 ext. No extinction.  Deep Tendon Reflexes:  Right: Biceps 2+, brachioradialis 3+, patella 4+, achilles 4+ Left: Biceps and brachioradialis 1+, patella 3+. Achilles 2+ Toes downgoing bilaterally Cerebellar: No ataxia with FNF on right. Mild ataxia with left FNF. H-S intact bilaterally  Gait: Deferred  Results for orders placed or performed during the hospital encounter of 03/15/18 (from the past 48 hour(s))  CBG monitoring, ED     Status: None   Collection Time: 03/15/18  8:02 PM  Result Value Ref Range   Glucose-Capillary 82 70 - 99 mg/dL   Comment 1 Notify RN    Comment 2 Document in Chart   Protime-INR     Status: None   Collection Time: 03/15/18  8:08 PM  Result Value Ref Range   Prothrombin Time 14.0 11.4 - 15.2 seconds   INR 1.09     Comment: Performed at [facility] [address], [place], [place] [address]  APTT     Status: None   Collection Time: 03/15/18  8:08 PM  Result Value Ref Range   aPTT 33 24 - 36 seconds    Comment: Performed at [facility]  [address], [place] [address]  CBC     Status: None   Collection Time: 03/15/18  8:08 PM  Result Value Ref Range   WBC 7.1 4.0 - 10.5 K/uL   RBC 5.17 4.22 - 5.81 MIL/uL   Hemoglobin 15.5 13.0 - 17.0 g/dL   HCT 47.9 39.0 - 52.0 %   MCV 92.6 80.0 - 100.0 fL   MCH 30.0 26.0 - 34.0 pg   MCHC 32.4 30.0 - 36.0 g/dL   RDW 13.7 11.5 - 15.5 %   Platelets 386 150 - 400 K/uL   nRBC 0.0 0.0 - 0.2 %    Comment: Performed at [facility], [place] [address], [place], [place] [address]  Differential     Status: None   Collection Time: 03/15/18  8:08 PM  Result Value Ref  Range   Neutrophils Relative % 49 %   Neutro Abs 3.5 1.7 - 7.7 K/uL   Lymphocytes Relative 39 %   Lymphs Abs 2.8 0.7 - 4.0 K/uL   Monocytes Relative 8 %   Monocytes Absolute 0.6 0.1 - 1.0 K/uL   Eosinophils Relative 3 %   Eosinophils Absolute 0.2 0.0 - 0.5 K/uL   Basophils Relative 1 %   Basophils Absolute 0.0 0.0 - 0.1 K/uL   Immature Granulocytes 0 %   Abs Immature Granulocytes 0.02 0.00 - 0.07 K/uL    Comment: Performed at [facility], [place] [address] [place], [place] [address]  Comprehensive metabolic panel     Status: Abnormal   Collection Time: 03/15/18  8:08 PM  Result Value Ref Range   Sodium 138 135 - 145 mmol/L   Potassium 3.5 3.5 - 5.1 mmol/L   Chloride 107 98 - 111 mmol/L   CO2 23 22 - 32 mmol/L   Glucose, Bld 88 70 - 99 mg/dL   BUN 15 6 - 20 mg/dL   Creatinine, Ser 1.30 (H) 0.61 - 1.24 mg/dL   Calcium 9.6 8.9 - 10.3 mg/dL   Total Protein 8.5 (H) 6.5 - 8.1 g/dL   Albumin 4.3 3.5 - 5.0 g/dL   AST 27 15 - 41 U/L   ALT 25 0 - 44 U/L   Alkaline Phosphatase 77 38 - 126 U/L   Total Bilirubin 1.0 0.3 - 1.2 mg/dL   GFR calc non Af Amer >60 >60 mL/min   GFR calc Af Amer >60 >60 mL/min    Comment: (NOTE) The eGFR has been calculated using the CKD EPI equation. This calculation has not been validated in all clinical situations. eGFR's persistently <60 mL/min signify possible Chronic Kidney Disease.    Anion gap 8 5 - 15    Comment: Performed at [facility] [address], [place], [place] [address]  I-stat troponin, ED     Status: None   Collection Time: 03/15/18  8:14 PM  Result Value Ref Range   Troponin i, poc 0.01 0.00 - 0.08 ng/mL   Comment 3            Comment: Due to the release kinetics of cTnI, a negative result within the first hours of the onset of symptoms does not rule out myocardial infarction with certainty. If myocardial infarction is still suspected, repeat the test at appropriate intervals.   I-Stat Chem 8, ED     Status: Abnormal    Collection Time: 03/15/18  8:16 PM  Result Value Ref Range   Sodium 141 135 - 145 mmol/L   Potassium  3.6 3.5 - 5.1 mmol/L   Chloride 107 98 - 111 mmol/L   BUN 17 6 - 20 mg/dL   Creatinine, Ser 1.40 (H) 0.61 - 1.24 mg/dL   Glucose, Bld 92 70 - 99 mg/dL   Calcium, Ion 1.12 (L) 1.15 - 1.40 mmol/L   TCO2 23 22 - 32 mmol/L   Hemoglobin 16.7 13.0 - 17.0 g/dL   HCT 49.0 39.0 - 52.0 %   Ct Head Wo Contrast  Result Date: 03/15/2018 CLINICAL DATA:  Right-sided facial droop EXAM: CT HEAD WITHOUT CONTRAST TECHNIQUE: Contiguous axial images were obtained from the base of the skull through the vertex without intravenous contrast. COMPARISON:  11/29/2015 FINDINGS: Brain: New area of hypodensity, age indeterminate but likely recent involving the left basal ganglia and corona radiata suspicious for a nonhemorrhagic infarct. Smaller hypodensities in the left basal ganglia and along the right caudate body have the appearance of more remote infarcts. No acute intracranial hemorrhage, midline shift or edema. No large vascular territory infarction. No hydrocephalus, intra or extra-axial mass or extra-axial fluid collections. Midline fourth ventricle basal cisterns. Vascular: No hyperdense vessel sign. Skull: No acute skull fracture. Sinuses/Orbits: No acute finding. Other: None. IMPRESSION: Age-indeterminate but more likely a subacute to acute nonhemorrhagic infarct involving left basal ganglia and corona radiata. More remote appearing small bilateral basal ganglial and right caudate body lacunar infarcts. Electronically Signed   By: Ashley Royalty M.D.   On: 03/15/2018 21:08   Mr Brain Wo Contrast (neuro Protocol)  Result Date: 03/15/2018 CLINICAL DATA:  Initial evaluation for acute right facial and right lower extremity weakness. EXAM: MRI HEAD WITHOUT CONTRAST MRA HEAD WITHOUT CONTRAST TECHNIQUE: Multiplanar, multiecho pulse sequences of the brain and surrounding structures were obtained without intravenous  contrast. Angiographic images of the head were obtained using MRA technique without contrast. COMPARISON:  Prior CT from earlier the same day as well as previous MRIs from 11/28/2015. FINDINGS: MRI HEAD FINDINGS Brain: Cerebral volume within normal limits for age. Mild chronic microvascular ischemic changes present within the periventricular white matter. Small remote/chronic lacunar infarcts seen involving the posterior right corona radiata. Associated small amount of chronic hemosiderin staining. Probable tiny remote left thalamic lacunar infarct noted as well. Abnormal restricted diffusion measuring 2.4 x 1.5 x 2.4 cm seen involving the left basal ganglia, extending from the left lentiform nucleus into the caudate/corona radiata (series 3, image 34). No associated hemorrhage or mass effect. Finding corresponds with abnormality seen on prior CT. No other evidence for acute or subacute ischemia. Gray-white matter differentiation otherwise maintained. No evidence for acute intracranial hemorrhage. No mass lesion, midline shift or mass effect. No hydrocephalus. No extra-axial fluid collection. 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 Pituitary gland and sella otherwise unremarkable. Midline structures intact. Vascular: Major intracranial vascular flow voids maintained. Skull and upper cervical spine: And Landau occipital assimilation noted. Craniocervical junction otherwise unremarkable. No focal marrow replacing lesions. Scalp soft tissues unremarkable. Sinuses/Orbits: Globes orbital soft tissues within normal limits. Scattered mucosal thickening throughout the frontal, ethmoidal, and maxillary sinuses. Superimposed air-fluid level at the right frontoethmoidal recess, suggesting acute sinusitis. No mastoid effusion. Inner ear structures normal. Other: None. MRA HEAD FINDINGS ANTERIOR CIRCULATION: Distal cervical segments of the internal carotid arteries are patent with antegrade  flow. Petrous segments patent bilaterally without stenosis. Multifocal atheromatous irregularity throughout the cavernous/supraclinoid ICAs with resultant mild to moderate stenoses (up to approximately 50%), worse on the left. No made of a small 2 mm focal outpouching  extending from the anterior genu of the cavernous left ICA, which could reflect a small aneurysm (series 4, image 75). ICA termini patent bilaterally. Right A1 dominant and widely patent. Absent and/or hypoplastic left A1, accounting for the diminutive left ICA is compared to the right. Normal anterior communicating artery. Anterior cerebral arteries demonstrate scattered multifocal atheromatous irregularity but are patent to their distal aspects without flow-limiting stenosis. Left M1 widely patent proximally, subsequently bifurcates early. Short-segment moderate proximal left M2 stenosis, inferior division (series 453, image 9). Right M1 demonstrates atheromatous irregularity but is patent without high-grade stenosis. Normal right MCA bifurcation. Distal small vessel atheromatous irregularity noted within the MCA branches bilaterally. POSTERIOR CIRCULATION: Vertebral arteries diminutive but widely patent to the vertebrobasilar junction without stenosis. Right vertebral artery slightly dominant. Posterior inferior cerebral arteries not seen on this exam. Atheromatous irregularity with mild narrowing involving the mid and distal diminutive basilar artery. Superior cerebral arteries patent bilaterally. Predominant fetal type origin of the PCAs bilaterally, although a small hypoplastic left P1 segment noted. PCAs demonstrate multifocal atheromatous irregularity without high-grade stenosis, right slightly worse than left. IMPRESSION: MRI HEAD IMPRESSION: 1. Acute ischemic nonhemorrhagic infarct involving the left basal ganglia/corona radiata. 2. Underlying mild chronic small vessel ischemic disease with small remote lacunar infarct involving the right  posterior corona radiata. 3. Acute fronto ethmoidal sinusitis. MRA HEAD IMPRESSION: 1. Negative intracranial MRA for large vessel occlusion. 2. Mild-to-moderate atherosclerotic change involving both the anterior and posterior circulations without proximal high-grade or correctable stenosis, most notable about the carotid siphons. 3. Short-segment moderate proximal left M2 stenosis, similar to previous. 4. 2 mm focal outpouching arising from the cavernous left ICA as above, suspicious for a possible small aneurysm. Attention at follow-up recommended. 5. Hypoplastic/absent left A1 segment with associated diminutive left ICA as compared to the right. ACAs supplied via the right carotid artery system. 6. Predominant fetal type origin of the PCAs with secondary overall diminutive vertebrobasilar system. Electronically Signed   By: Jeannine Boga M.D.   On: 03/15/2018 22:42   Mr Virgel Paling (cerebral Arteries)  Result Date: 03/15/2018 CLINICAL DATA:  Initial evaluation for acute right facial and right lower extremity weakness. EXAM: MRI HEAD WITHOUT CONTRAST MRA HEAD WITHOUT CONTRAST TECHNIQUE: Multiplanar, multiecho pulse sequences of the brain and surrounding structures were obtained without intravenous contrast. Angiographic images of the head were obtained using MRA technique without contrast. COMPARISON:  Prior CT from earlier the same day as well as previous MRIs from 11/28/2015. FINDINGS: MRI HEAD FINDINGS Brain: Cerebral volume within normal limits for age. Mild chronic microvascular ischemic changes present within the periventricular white matter. Small remote/chronic lacunar infarcts seen involving the posterior right corona radiata. Associated small amount of chronic hemosiderin staining. Probable tiny remote left thalamic lacunar infarct noted as well. Abnormal restricted diffusion measuring 2.4 x 1.5 x 2.4 cm seen involving the left basal ganglia, extending from the left lentiform nucleus into the  caudate/corona radiata (series 3, image 34). No associated hemorrhage or mass effect. Finding corresponds with abnormality seen on prior CT. No other evidence for acute or subacute ischemia. Gray-white matter differentiation otherwise maintained. No evidence for acute intracranial hemorrhage. No mass lesion, midline shift or mass effect. No hydrocephalus. No extra-axial fluid collection. 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 Pituitary gland and sella otherwise unremarkable. Midline structures intact. Vascular: Major intracranial vascular flow voids maintained. Skull and upper cervical spine: And Landau occipital assimilation noted. Craniocervical junction otherwise unremarkable. No focal marrow replacing lesions.  Scalp soft tissues unremarkable. Sinuses/Orbits: Globes orbital soft tissues within normal limits. Scattered mucosal thickening throughout the frontal, ethmoidal, and maxillary sinuses. Superimposed air-fluid level at the right frontoethmoidal recess, suggesting acute sinusitis. No mastoid effusion. Inner ear structures normal. Other: None. MRA HEAD FINDINGS ANTERIOR CIRCULATION: Distal cervical segments of the internal carotid arteries are patent with antegrade flow. Petrous segments patent bilaterally without stenosis. Multifocal atheromatous irregularity throughout the cavernous/supraclinoid ICAs with resultant mild to moderate stenoses (up to approximately 50%), worse on the left. No made of a small 2 mm focal outpouching extending from the anterior genu of the cavernous left ICA, which could reflect a small aneurysm (series 4, image 75). ICA termini patent bilaterally. Right A1 dominant and widely patent. Absent and/or hypoplastic left A1, accounting for the diminutive left ICA is compared to the right. Normal anterior communicating artery. Anterior cerebral arteries demonstrate scattered multifocal atheromatous irregularity but are patent to their distal aspects  without flow-limiting stenosis. Left M1 widely patent proximally, subsequently bifurcates early. Short-segment moderate proximal left M2 stenosis, inferior division (series 453, image 9). Right M1 demonstrates atheromatous irregularity but is patent without high-grade stenosis. Normal right MCA bifurcation. Distal small vessel atheromatous irregularity noted within the MCA branches bilaterally. POSTERIOR CIRCULATION: Vertebral arteries diminutive but widely patent to the vertebrobasilar junction without stenosis. Right vertebral artery slightly dominant. Posterior inferior cerebral arteries not seen on this exam. Atheromatous irregularity with mild narrowing involving the mid and distal diminutive basilar artery. Superior cerebral arteries patent bilaterally. Predominant fetal type origin of the PCAs bilaterally, although a small hypoplastic left P1 segment noted. PCAs demonstrate multifocal atheromatous irregularity without high-grade stenosis, right slightly worse than left. IMPRESSION: MRI HEAD IMPRESSION: 1. Acute ischemic nonhemorrhagic infarct involving the left basal ganglia/corona radiata. 2. Underlying mild chronic small vessel ischemic disease with small remote lacunar infarct involving the right posterior corona radiata. 3. Acute fronto ethmoidal sinusitis. MRA HEAD IMPRESSION: 1. Negative intracranial MRA for large vessel occlusion. 2. Mild-to-moderate atherosclerotic change involving both the anterior and posterior circulations without proximal high-grade or correctable stenosis, most notable about the carotid siphons. 3. Short-segment moderate proximal left M2 stenosis, similar to previous. 4. 2 mm focal outpouching arising from the cavernous left ICA as above, suspicious for a possible small aneurysm. Attention at follow-up recommended. 5. Hypoplastic/absent left A1 segment with associated diminutive left ICA as compared to the right. ACAs supplied via the right carotid artery system. 6. Predominant  fetal type origin of the PCAs with secondary overall diminutive vertebrobasilar system. Electronically Signed   By: Jeannine Boga M.D.   On: 03/15/2018 22:42    Assessment: 52 y.o. male with subacute left putamen/caudate ischemic infarction.  Presenting symptoms were right facial weakness starting at 7 AM today, as well as worsening RLE weakness since Monday, relative to his baseline weakness from a prior stroke.  1. Exam reveals right facial droop, dysarthria, increased latency of motor responses on the right and hyperreflexia 2. CT head reveals a subacute nonhemorrhagic infarct involving left basal ganglia and corona radiata. More remote appearing small bilateral basal ganglia and right caudate body lacunar infarcts are also noted. 3. MRI brain confirms a subacute ischemic nonhemorrhagic infarct involving the left basal ganglia/corona radiata. Underlying mild chronic small vessel ischemic disease with small remote lacunar infarct involving the right posterior corona radiata and acute fronto ethmoidal sinusitis are also noted.  4. MRA head:  Negative intracranial MRA for large vessel occlusion. Mild-to-moderate atherosclerotic change involving both the anterior and posterior circulations without proximal  high-grade or correctable stenosis, most notable about the carotid siphons. Short-segment moderate proximal left M2 stenosis, similar to previous. 4. 2 mm focal outpouching arising from the cavernous left ICA, suspicious for a possible small aneurysm. Hypoplastic/absent left A1 segment with associated diminutive left ICA as compared to the right. ACAs supplied via the right carotid artery system. Predominant fetal type origin of the PCAs with secondary overall diminutive vertebrobasilar system 5. Stroke Risk Factors - HLD, HTN and prior stroke  Plan: 1. HgbA1c, fasting lipid panel 2. PT consult, OT consult, Speech consult 3. Echocardiogram 4. Carotid dopplers 5. Cardiac telemetry. 6.  Prophylactic therapy- Given that patient has failed ASA monotherapy and has significant intracranial atherosclerotic disease on MRA, would add Plavix to ASA for DAPT.  7. Risk factor modification 8. Permissive HTN x 24 hours, then decrease SBP gradually by 15% per day 9. Frequent neuro checks 10. Start atorvastatin 40 mg po qd. Obtain baseline CK level.    _0  signed: Dr. Kerney Elbe  03/15/2018, 10:50 PM

## 2018-03-15 NOTE — ED Notes (Signed)
ED Provider at bedside. 

## 2018-03-15 NOTE — ED Triage Notes (Signed)
Pt to triage with R facial droop and slurred speech since this morning;  Dragging R leg since Monday worse than normal from prior stroke

## 2018-03-15 NOTE — ED Notes (Signed)
Last seen Normal Yesterday Per pt Wife

## 2018-03-15 NOTE — ED Notes (Signed)
Patient transported to CT 

## 2018-03-15 NOTE — ED Provider Notes (Signed)
Patient placed in Quick Look pathway, seen and evaluated   Chief Complaint: right facial weakness since 7am.  Dragging right leg more than usual since Monday  HPI:   Pt has had a previous stroke and has residual weakness in right lower extremity.  Pt has had increased weakness right leg since Monday   ROS: no fever, no chills,    Physical Exam:   Gen: No distress  Neuro: Awake and Alert  Skin: Warm    Focused Exam: right facial droop,  Right leg weaker than left.  BP 239/124   Stroke order set placed,  Pt moved to back. Physician notified   Initiation of care has begun. The patient has been counseled on the process, plan, and necessity for staying for the completion/evaluation, and the remainder of the medical screening examination   Osie Cheeks 03/15/18 2013    Virgina Norfolk, DO 03/16/18 0006

## 2018-03-15 NOTE — ED Provider Notes (Signed)
[facility] EMERGENCY DEPARTMENT Provider Note   CSN: [id number] Arrival date & time: 03/15/18  1953     History   Chief Complaint Chief Complaint  Patient presents with  . Facial Droop  . Aphasia    HPI Eric Patterson is a 52 y.o. male.  The history is provided by the patient.  Cerebrovascular Accident  This is a new problem. The current episode started 12 to 24 hours ago. The problem occurs constantly. The problem has not changed since onset.Pertinent negatives include no chest pain, no abdominal pain, no headaches and no shortness of breath. Associated symptoms comments: Right side facial droop, difficulty speaking (resolved). Nothing aggravates the symptoms. Nothing relieves the symptoms. He has tried nothing for the symptoms. The treatment provided no relief.    Past Medical History:  Diagnosis Date  . Hyperlipidemia   . Hypertension   . Stroke [facility]) 2017   R side weakness deficits    Patient Active Problem List   Diagnosis Date Noted  . Hyperlipidemia LDL goal <70 10/21/2016  . Chronic left-sided low back pain without sciatica 10/21/2016  . Hypertension 11/28/2015  . Lacunar infarction (HCC) 11/28/2015    History reviewed. No pertinent surgical history.      Home Medications    Prior to Admission medications   Medication Sig Start Date End Date Taking? Authorizing Provider  aspirin 81 MG EC tablet Take 1 tablet (81 mg total) by mouth daily. 11/30/15  Yes Patel, Hal Morales, MD  telmisartan-hydrochlorothiazide (MICARDIS HCT) 80-25 MG tablet Take 1 tablet by mouth daily. 01/09/18  Yes Bast, Traci A, NP  cephALEXin (KEFLEX) 500 MG capsule Take 1 capsule (500 mg total) by mouth 2 (two) times daily. Patient not taking: Reported on 03/15/2018 12/01/16   Hedges, Tinnie Gens, PA-C  EPINEPHrine 0.3 mg/0.3 mL IJ SOAJ injection Inject 0.3 mLs (0.3 mg total) into the muscle once. Patient not taking: Reported on 03/15/2018 02/11/15   Urban Gibson, MD    trimethoprim-polymyxin b [facility]) ophthalmic solution Place 1 drop into the right eye every 4 (four) hours. Patient not taking: Reported on 03/15/2018 01/09/18   Janace Aris, NP    Family History Family History  Problem Relation Age of Onset  . Heart attack Mother   . Stroke Brother 48  . Stroke Other     Social History Social History   Tobacco Use  . Smoking status: Current Some Day Smoker    Packs/day: 0.50    Types: Cigarettes    Last attempt to quit: 11/28/2015    Years since quitting: 2.2  . Smokeless tobacco: Never Used  Substance Use Topics  . Alcohol use: Yes    Comment: Occasional  . Drug use: No     Allergies   Patient has no known allergies.   Review of Systems Review of Systems  Constitutional: Negative for chills and fever.  HENT: Negative for ear pain and sore throat.   Eyes: Negative for photophobia, pain, discharge, redness, itching and visual disturbance.  Respiratory: Negative for cough and shortness of breath.   Cardiovascular: Negative for chest pain and palpitations.  Gastrointestinal: Negative for abdominal pain and vomiting.  Genitourinary: Negative for dysuria and hematuria.  Musculoskeletal: Negative for arthralgias and back pain.  Skin: Negative for color change and rash.  Neurological: Positive for facial asymmetry and speech difficulty. Negative for dizziness, tremors, seizures, syncope, weakness, light-headedness, numbness and headaches.  All other systems reviewed and are negative.    Physical Exam Updated Vital  Signs  ED Triage Vitals  Enc Vitals Group     BP 03/15/18 2004 (!) 239/124     Pulse Rate 03/15/18 2004 80     Resp 03/15/18 2004 18     Temp --      Temp src --      SpO2 03/15/18 2004 98 %     Weight 03/15/18 2006 175 lb (79.4 kg)     Height 03/15/18 2006 5\' 6"  (1.676 m)     Head Circumference --      Peak Flow --      Pain Score 03/15/18 2005 0     Pain Loc --      Pain Edu? --      Excl. in GC? --      Physical Exam  Constitutional: He is oriented to person, place, and time. He appears well-developed and well-nourished.  HENT:  Head: Normocephalic and atraumatic.  Eyes: Pupils are equal, round, and reactive to light. Conjunctivae and EOM are normal.  Neck: Normal range of motion. Neck supple.  Cardiovascular: Normal rate, regular rhythm, normal heart sounds and intact distal pulses.  No murmur heard. Pulmonary/Chest: Effort normal and breath sounds normal. No respiratory distress.  Abdominal: Soft. There is no tenderness.  Musculoskeletal: Normal range of motion. He exhibits no edema.  Neurological: He is alert and oriented to person, place, and time.  Patient with right-sided facial droop that does not involve the forehead, otherwise cranial nerves intact, normal sensation throughout, 5+ out of 5 strength in the left upper extremity left lower extremity, 5+ out of 5 strength in the right upper extremity, 4+5 out strength in right lower extremity, normal to finger-nose-finger, no drift, 20/20 vision bilaterally, no visual field deficit  Skin: Skin is warm and dry.  Psychiatric: He has a normal mood and affect.  Nursing note and vitals reviewed.    ED Treatments / Results  Labs (all labs ordered are listed, but only abnormal results are displayed) Labs Reviewed  COMPREHENSIVE METABOLIC PANEL - Abnormal; Notable for the following components:      Result Value   Creatinine, Ser 1.30 (*)    Total Protein 8.5 (*)    All other components within normal limits  I-STAT CHEM 8, ED - Abnormal; Notable for the following components:   Creatinine, Ser 1.40 (*)    Calcium, Ion 1.12 (*)    All other components within normal limits  PROTIME-INR  APTT  CBC  DIFFERENTIAL  RAPID URINE DRUG SCREEN, HOSP PERFORMED  CBG MONITORING, ED  I-STAT TROPONIN, ED  CBG MONITORING, ED    EKG EKG Interpretation  Date/Time:  Wednesday March 15 2018 20:00:46 EDT Ventricular Rate:  73 PR  Interval:  152 QRS Duration: 92 QT Interval:  378 QTC Calculation: 416 R Axis:   74 Text Interpretation:  Normal sinus rhythm Possible Left atrial enlargement Left ventricular hypertrophy Abnormal ECG Confirmed by Virgina Norfolk [phone]) on 03/15/2018 8:22:06 PM   Radiology Ct Head Wo Contrast  Result Date: 03/15/2018 CLINICAL DATA:  Right-sided facial droop EXAM: CT HEAD WITHOUT CONTRAST TECHNIQUE: Contiguous axial images were obtained from the base of the skull through the vertex without intravenous contrast. COMPARISON:  11/29/2015 FINDINGS: Brain: New area of hypodensity, age indeterminate but likely recent involving the left basal ganglia and corona radiata suspicious for a nonhemorrhagic infarct. Smaller hypodensities in the left basal ganglia and along the right caudate body have the appearance of more remote infarcts. No acute intracranial hemorrhage, midline shift  or edema. No large vascular territory infarction. No hydrocephalus, intra or extra-axial mass or extra-axial fluid collections. Midline fourth ventricle basal cisterns. Vascular: No hyperdense vessel sign. Skull: No acute skull fracture. Sinuses/Orbits: No acute finding. Other: None. IMPRESSION: Age-indeterminate but more likely a subacute to acute nonhemorrhagic infarct involving left basal ganglia and corona radiata. More remote appearing small bilateral basal ganglial and right caudate body lacunar infarcts. Electronically Signed   By: Tollie Eth M.D.   On: 03/15/2018 21:08   Mr Brain Wo Contrast (neuro Protocol)  Result Date: 03/15/2018 CLINICAL DATA:  Initial evaluation for acute right facial and right lower extremity weakness. EXAM: MRI HEAD WITHOUT CONTRAST MRA HEAD WITHOUT CONTRAST TECHNIQUE: Multiplanar, multiecho pulse sequences of the brain and surrounding structures were obtained without intravenous contrast. Angiographic images of the head were obtained using MRA technique without contrast. COMPARISON:  Prior CT from  earlier the same day as well as previous MRIs from 11/28/2015. FINDINGS: MRI HEAD FINDINGS Brain: Cerebral volume within normal limits for age. Mild chronic microvascular ischemic changes present within the periventricular white matter. Small remote/chronic lacunar infarcts seen involving the posterior right corona radiata. Associated small amount of chronic hemosiderin staining. Probable tiny remote left thalamic lacunar infarct noted as well. Abnormal restricted diffusion measuring 2.4 x 1.5 x 2.4 cm seen involving the left basal ganglia, extending from the left lentiform nucleus into the caudate/corona radiata (series 3, image 34). No associated hemorrhage or mass effect. Finding corresponds with abnormality seen on prior CT. No other evidence for acute or subacute ischemia. Gray-white matter differentiation otherwise maintained. No evidence for acute intracranial hemorrhage. No mass lesion, midline shift or mass effect. No hydrocephalus. No extra-axial fluid collection. 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 Pituitary gland and sella otherwise unremarkable. Midline structures intact. Vascular: Major intracranial vascular flow voids maintained. Skull and upper cervical spine: And Landau occipital assimilation noted. Craniocervical junction otherwise unremarkable. No focal marrow replacing lesions. Scalp soft tissues unremarkable. Sinuses/Orbits: Globes orbital soft tissues within normal limits. Scattered mucosal thickening throughout the frontal, ethmoidal, and maxillary sinuses. Superimposed air-fluid level at the right frontoethmoidal recess, suggesting acute sinusitis. No mastoid effusion. Inner ear structures normal. Other: None. MRA HEAD FINDINGS ANTERIOR CIRCULATION: Distal cervical segments of the internal carotid arteries are patent with antegrade flow. Petrous segments patent bilaterally without stenosis. Multifocal atheromatous irregularity throughout the  cavernous/supraclinoid ICAs with resultant mild to moderate stenoses (up to approximately 50%), worse on the left. No made of a small 2 mm focal outpouching extending from the anterior genu of the cavernous left ICA, which could reflect a small aneurysm (series 4, image 75). ICA termini patent bilaterally. Right A1 dominant and widely patent. Absent and/or hypoplastic left A1, accounting for the diminutive left ICA is compared to the right. Normal anterior communicating artery. Anterior cerebral arteries demonstrate scattered multifocal atheromatous irregularity but are patent to their distal aspects without flow-limiting stenosis. Left M1 widely patent proximally, subsequently bifurcates early. Short-segment moderate proximal left M2 stenosis, inferior division (series 453, image 9). Right M1 demonstrates atheromatous irregularity but is patent without high-grade stenosis. Normal right MCA bifurcation. Distal small vessel atheromatous irregularity noted within the MCA branches bilaterally. POSTERIOR CIRCULATION: Vertebral arteries diminutive but widely patent to the vertebrobasilar junction without stenosis. Right vertebral artery slightly dominant. Posterior inferior cerebral arteries not seen on this exam. Atheromatous irregularity with mild narrowing involving the mid and distal diminutive basilar artery. Superior cerebral arteries patent bilaterally. Predominant fetal type origin of the  PCAs bilaterally, although a small hypoplastic left P1 segment noted. PCAs demonstrate multifocal atheromatous irregularity without high-grade stenosis, right slightly worse than left. IMPRESSION: MRI HEAD IMPRESSION: 1. Acute ischemic nonhemorrhagic infarct involving the left basal ganglia/corona radiata. 2. Underlying mild chronic small vessel ischemic disease with small remote lacunar infarct involving the right posterior corona radiata. 3. Acute fronto ethmoidal sinusitis. MRA HEAD IMPRESSION: 1. Negative intracranial MRA  for large vessel occlusion. 2. Mild-to-moderate atherosclerotic change involving both the anterior and posterior circulations without proximal high-grade or correctable stenosis, most notable about the carotid siphons. 3. Short-segment moderate proximal left M2 stenosis, similar to previous. 4. 2 mm focal outpouching arising from the cavernous left ICA as above, suspicious for a possible small aneurysm. Attention at follow-up recommended. 5. Hypoplastic/absent left A1 segment with associated diminutive left ICA as compared to the right. ACAs supplied via the right carotid artery system. 6. Predominant fetal type origin of the PCAs with secondary overall diminutive vertebrobasilar system. Electronically Signed   By: Rise Mu M.D.   On: 03/15/2018 22:42   Mr Shirlee Latch (cerebral Arteries)  Result Date: 03/15/2018 CLINICAL DATA:  Initial evaluation for acute right facial and right lower extremity weakness. EXAM: MRI HEAD WITHOUT CONTRAST MRA HEAD WITHOUT CONTRAST TECHNIQUE: Multiplanar, multiecho pulse sequences of the brain and surrounding structures were obtained without intravenous contrast. Angiographic images of the head were obtained using MRA technique without contrast. COMPARISON:  Prior CT from earlier the same day as well as previous MRIs from 11/28/2015. FINDINGS: MRI HEAD FINDINGS Brain: Cerebral volume within normal limits for age. Mild chronic microvascular ischemic changes present within the periventricular white matter. Small remote/chronic lacunar infarcts seen involving the posterior right corona radiata. Associated small amount of chronic hemosiderin staining. Probable tiny remote left thalamic lacunar infarct noted as well. Abnormal restricted diffusion measuring 2.4 x 1.5 x 2.4 cm seen involving the left basal ganglia, extending from the left lentiform nucleus into the caudate/corona radiata (series 3, image 34). No associated hemorrhage or mass effect. Finding corresponds with  abnormality seen on prior CT. No other evidence for acute or subacute ischemia. Gray-white matter differentiation otherwise maintained. No evidence for acute intracranial hemorrhage. No mass lesion, midline shift or mass effect. No hydrocephalus. No extra-axial fluid collection. 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 Pituitary gland and sella otherwise unremarkable. Midline structures intact. Vascular: Major intracranial vascular flow voids maintained. Skull and upper cervical spine: And Landau occipital assimilation noted. Craniocervical junction otherwise unremarkable. No focal marrow replacing lesions. Scalp soft tissues unremarkable. Sinuses/Orbits: Globes orbital soft tissues within normal limits. Scattered mucosal thickening throughout the frontal, ethmoidal, and maxillary sinuses. Superimposed air-fluid level at the right frontoethmoidal recess, suggesting acute sinusitis. No mastoid effusion. Inner ear structures normal. Other: None. MRA HEAD FINDINGS ANTERIOR CIRCULATION: Distal cervical segments of the internal carotid arteries are patent with antegrade flow. Petrous segments patent bilaterally without stenosis. Multifocal atheromatous irregularity throughout the cavernous/supraclinoid ICAs with resultant mild to moderate stenoses (up to approximately 50%), worse on the left. No made of a small 2 mm focal outpouching extending from the anterior genu of the cavernous left ICA, which could reflect a small aneurysm (series 4, image 75). ICA termini patent bilaterally. Right A1 dominant and widely patent. Absent and/or hypoplastic left A1, accounting for the diminutive left ICA is compared to the right. Normal anterior communicating artery. Anterior cerebral arteries demonstrate scattered multifocal atheromatous irregularity but are patent to their distal aspects without flow-limiting stenosis. Left M1  widely patent proximally, subsequently bifurcates early. Short-segment  moderate proximal left M2 stenosis, inferior division (series 453, image 9). Right M1 demonstrates atheromatous irregularity but is patent without high-grade stenosis. Normal right MCA bifurcation. Distal small vessel atheromatous irregularity noted within the MCA branches bilaterally. POSTERIOR CIRCULATION: Vertebral arteries diminutive but widely patent to the vertebrobasilar junction without stenosis. Right vertebral artery slightly dominant. Posterior inferior cerebral arteries not seen on this exam. Atheromatous irregularity with mild narrowing involving the mid and distal diminutive basilar artery. Superior cerebral arteries patent bilaterally. Predominant fetal type origin of the PCAs bilaterally, although a small hypoplastic left P1 segment noted. PCAs demonstrate multifocal atheromatous irregularity without high-grade stenosis, right slightly worse than left. IMPRESSION: MRI HEAD IMPRESSION: 1. Acute ischemic nonhemorrhagic infarct involving the left basal ganglia/corona radiata. 2. Underlying mild chronic small vessel ischemic disease with small remote lacunar infarct involving the right posterior corona radiata. 3. Acute fronto ethmoidal sinusitis. MRA HEAD IMPRESSION: 1. Negative intracranial MRA for large vessel occlusion. 2. Mild-to-moderate atherosclerotic change involving both the anterior and posterior circulations without proximal high-grade or correctable stenosis, most notable about the carotid siphons. 3. Short-segment moderate proximal left M2 stenosis, similar to previous. 4. 2 mm focal outpouching arising from the cavernous left ICA as above, suspicious for a possible small aneurysm. Attention at follow-up recommended. 5. Hypoplastic/absent left A1 segment with associated diminutive left ICA as compared to the right. ACAs supplied via the right carotid artery system. 6. Predominant fetal type origin of the PCAs with secondary overall diminutive vertebrobasilar system. Electronically Signed    By: Rise Mu M.D.   On: 03/15/2018 22:42    Procedures .Critical Care Performed by: Virgina Norfolk, DO Authorized by: Virgina Norfolk, DO   Critical care provider statement:    Critical care time (minutes):  33   Critical care was necessary to treat or prevent imminent or life-threatening deterioration of the following conditions:  CNS failure or compromise   Critical care was time spent personally by me on the following activities:  Development of treatment plan with patient or surrogate, discussions with consultants, discussions with primary provider, evaluation of patient's response to treatment, examination of patient, ordering and performing treatments and interventions, ordering and review of laboratory studies, ordering and review of radiographic studies, pulse oximetry, re-evaluation of patient's condition and review of old charts   I assumed direction of critical care for this patient from another provider in my specialty: no     (including critical care time)  Medications Ordered in ED Medications  labetalol (NORMODYNE,TRANDATE) injection 10 mg (10 mg Intravenous Given 03/15/18 2043)  nicardipine (CARDENE) 20mg  in 0.86% saline IV infusion (0.1 mg/ml) (0 mg/hr Intravenous Paused 03/15/18 2146)  LORazepam (ATIVAN) injection 1 mg (1 mg Intravenous Given 03/15/18 2136)     Initial Impression / Assessment and Plan / ED Course  I have reviewed the triage vital signs and the nursing notes.  Pertinent labs & imaging results that were available during my care of the patient were reviewed by me and considered in my medical decision making (see chart for details).     Eric Patterson is a 52 year old male with history of stroke, hypertension who presents to the ED with right-sided facial droop, difficulty speaking.  Patient with hypertension upon arrival with blood pressure in the 240/120.  No fever.  Patient states that at work today people stated that he like to get a  right-sided facial droop.  This was around 7 AM while over 12 hours ago.  States that he thought he had some speech problems earlier in the day that have now resolved.  He has chronic weakness in the right lower leg and uses a cane.  Patient has been without his blood pressure medication.  He states that he has been taking Plavix.  Patient has right-sided facial weakness on exam and has some right lower leg weakness which appears chronic.  Facial weakness is new.  Does not involve the forehead.  Concern for central process.  Suspect patient with stroke.  He is hypertensive.  He was given labetalol IV and started on IV nicardipine to help reduce blood pressure.  Patient had EKG that showed sinus rhythm.  No ischemic changes.  Head CT/MRI that showed showed signs of acute infarct involving the left basal ganglia.  Patient otherwise unremarkable lab work.  No significant anemia, electrolyte abnormality, kidney injury.  Patient with improved blood pressure and IV nicardipine infusion.  Patient to be admitted to hospitalist service for further stroke care.  Hemodynamically improved throughout my care.  This chart was dictated using voice recognition software.  Despite best efforts to proofread,  errors can occur which can change the documentation meaning.   Final Clinical Impressions(s) / ED Diagnoses   Final diagnoses:  Cerebrovascular accident (CVA), unspecified mechanism [facility])  Hypertensive emergency    ED Discharge Orders    None       Virgina Norfolk, DO 03/15/18 2249

## 2018-03-15 NOTE — ED Notes (Signed)
Patient transported to MRI 

## 2018-03-16 ENCOUNTER — Observation Stay (HOSPITAL_COMMUNITY): Payer: 59

## 2018-03-16 DIAGNOSIS — Z720 Tobacco use: Secondary | ICD-10-CM

## 2018-03-16 DIAGNOSIS — I639 Cerebral infarction, unspecified: Secondary | ICD-10-CM | POA: Diagnosis present

## 2018-03-16 DIAGNOSIS — I161 Hypertensive emergency: Secondary | ICD-10-CM | POA: Diagnosis present

## 2018-03-16 DIAGNOSIS — E785 Hyperlipidemia, unspecified: Secondary | ICD-10-CM | POA: Diagnosis present

## 2018-03-16 DIAGNOSIS — F17210 Nicotine dependence, cigarettes, uncomplicated: Secondary | ICD-10-CM | POA: Diagnosis present

## 2018-03-16 DIAGNOSIS — Z9114 Patient's other noncompliance with medication regimen: Secondary | ICD-10-CM | POA: Diagnosis not present

## 2018-03-16 DIAGNOSIS — G8191 Hemiplegia, unspecified affecting right dominant side: Secondary | ICD-10-CM | POA: Diagnosis present

## 2018-03-16 DIAGNOSIS — R29704 NIHSS score 4: Secondary | ICD-10-CM | POA: Diagnosis present

## 2018-03-16 DIAGNOSIS — I361 Nonrheumatic tricuspid (valve) insufficiency: Secondary | ICD-10-CM

## 2018-03-16 DIAGNOSIS — I6389 Other cerebral infarction: Secondary | ICD-10-CM | POA: Diagnosis not present

## 2018-03-16 DIAGNOSIS — Z8673 Personal history of transient ischemic attack (TIA), and cerebral infarction without residual deficits: Secondary | ICD-10-CM | POA: Diagnosis not present

## 2018-03-16 DIAGNOSIS — Z7982 Long term (current) use of aspirin: Secondary | ICD-10-CM | POA: Diagnosis not present

## 2018-03-16 DIAGNOSIS — R29810 Facial weakness: Secondary | ICD-10-CM | POA: Diagnosis present

## 2018-03-16 DIAGNOSIS — Z79899 Other long term (current) drug therapy: Secondary | ICD-10-CM | POA: Diagnosis not present

## 2018-03-16 LAB — RAPID URINE DRUG SCREEN, HOSP PERFORMED
AMPHETAMINES: NOT DETECTED
BARBITURATES: NOT DETECTED
Benzodiazepines: NOT DETECTED
Cocaine: NOT DETECTED
OPIATES: NOT DETECTED
TETRAHYDROCANNABINOL: NOT DETECTED

## 2018-03-16 LAB — HEMOGLOBIN A1C
HEMOGLOBIN A1C: 5.3 % (ref 4.8–5.6)
Mean Plasma Glucose: 105.41 mg/dL

## 2018-03-16 LAB — LIPID PANEL
Cholesterol: 150 mg/dL (ref 0–200)
HDL: 38 mg/dL — AB (ref >40)
LDL Cholesterol: 101 mg/dL — ABNORMAL HIGH (ref 0–99)
TRIGLYCERIDES: 54 mg/dL (ref <150)
Total CHOL/HDL Ratio: 3.9 RATIO
VLDL: 11 mg/dL (ref 0–40)

## 2018-03-16 LAB — HIV ANTIBODY (ROUTINE TESTING W REFLEX): HIV Screen 4th Generation wRfx: NONREACTIVE

## 2018-03-16 LAB — ECHOCARDIOGRAM COMPLETE
HEIGHTINCHES: 66 in
Weight: 2800 oz

## 2018-03-16 LAB — CK: CK TOTAL: 212 U/L (ref 49–397)

## 2018-03-16 MED ORDER — ASPIRIN EC 81 MG PO TBEC
81.0000 mg | DELAYED_RELEASE_TABLET | Freq: Every day | ORAL | Status: DC
Start: 2018-03-16 — End: 2018-03-17
  Administered 2018-03-16 – 2018-03-17 (×2): 81 mg via ORAL
  Filled 2018-03-16 (×2): qty 1

## 2018-03-16 MED ORDER — SODIUM CHLORIDE 0.9 % IV SOLN
INTRAVENOUS | Status: DC
  Administered 2018-03-16: 23:00:00 via INTRAVENOUS

## 2018-03-16 NOTE — Evaluation (Signed)
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 MRI on 10/30 showed acute ischemic nonhemorrhagic infarct involving the left basal ganglia.  Clinical Impression   Pt admitted with above diagnosis. Pt currently with functional limitations due to the deficits listed below (see PT Problem List). Pt presented supine, HOB elevated, and alert. Pt was willing to participate in PT. Pt was able to complete supine to sitting with modified independence, with increased time and effort needed. Pt performed sit to stand with min guard for safety. Pt was able to take ~ 4 steps w/o an AD but with decreased stability requiring min A. Pt then ambulated with a walker using min guard for safety. During ambulation, pt had decreased ability of RUE to grip on the walker Pt will benefit from skilled PT to increase their independence and safety with mobility to allow discharge to the venue listed below.        Follow Up Recommendations Outpatient PT    Equipment Recommendations  Rolling walker with 5" wheels    Recommendations for Other Services OT consult     Precautions / Restrictions Precautions Precautions: Fall Restrictions Weight Bearing Restrictions: No      Mobility  Bed Mobility Overal bed mobility: Modified Independent             General bed mobility comments: Pt able to roll from supine to sitting with increased time and effort.   Transfers Overall transfer level: Needs assistance Equipment used: None Transfers: Sit to/from Stand Sit to Stand: Min guard         General transfer comment: Min gaurd given for saftey and  stablility   Ambulation/Gait Ambulation/Gait assistance: Min guard Gait Distance (Feet): 125 Feet Assistive device: Rolling walker (2 wheeled) Gait Pattern/deviations: Decreased step length - right;Decreased step length - left;Step-through pattern;Decreased dorsiflexion - right   Gait velocity interpretation: 1.31 - 2.62 ft/sec, indicative of limited community ambulator General Gait Details: Pt demonstrated adequate foot clearance of the RLE, although dorsiflexion was decreased as compared to the LLE. Pt showed decreased ability to grip walker with R hand as compared to the left. Pt reported no signs of lightheadedness or dizziness. HR recorded at 69 max.  Stairs            Wheelchair Mobility    Modified Rankin (Stroke Patients Only) Modified Rankin (Stroke Patients Only) Pre-Morbid Rankin Score: No symptoms Modified Rankin: Moderately severe disability     Balance Overall balance assessment: Needs assistance Sitting-balance support: Feet supported Sitting balance-Leahy Scale: Good Sitting balance - Comments: Pt initially needed walker for standing support. Able to tolerate short intervals with no support- 10 seconds.   Standing balance support: Bilateral upper extremity supported;No upper extremity supported(Pt initially needed walker for standing support. )   Standing balance comment: Pt initially needed walker for standing support. Able to toleralte short intervals with no support- 10 seconds.      Tandem Stance - Right Leg: 0         High Level Balance Comments: Able to stand 10 seconds semi-tandem R LE leading              Pertinent Vitals/Pain Pain Assessment: Faces Faces Pain  Scale: No hurt    Home Living Family/patient expects to be discharged to:: Private residence Living Arrangements: Spouse/significant other Available Help at Discharge: Family;Available 24 hours/day Type of Home: House Home Access: Level entry     Home Layout: One level Home  Equipment: None      Prior Function Level of Independence: Independent         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      Hand Dominance   Dominant Hand: Right    Extremity/Trunk Assessment   Upper Extremity Assessment Upper Extremity Assessment: Defer to OT evaluation;Overall [facility] for tasks assessed    Lower Extremity Assessment Lower Extremity Assessment: RLE deficits/detail RLE Deficits / Details: decreased LE strength. 4/5 for both PF/DF RLE Sensation: WNL    Cervical / Trunk Assessment Cervical / Trunk Assessment: Normal  Communication   Communication: No difficulties  Cognition Arousal/Alertness: Awake/alert Behavior During Therapy: WFL for tasks assessed/performed Overall Cognitive Status: Within Functional Limits for tasks assessed                                 General Comments: Pt was alert and oriented      General Comments General comments (skin integrity, edema, etc.): Wrap on L knee     Exercises     Assessment/Plan    PT Assessment Patient needs continued PT services  PT Problem List Decreased strength;Decreased balance;Decreased mobility;Decreased coordination       PT Treatment Interventions DME instruction;Gait training;Stair training;Functional mobility training;Therapeutic activities;Therapeutic exercise;Balance training;Neuromuscular re-education    PT Goals (Current goals can be found in the Care Plan section)  Acute Rehab PT Goals Patient Stated Goal: To get back to normal  PT Goal Formulation: With patient Time For Goal Achievement: 03/30/18 Potential to Achieve Goals: Good    Frequency Min 4X/week   Barriers to discharge        Co-evaluation               AM-PAC PT "6 Clicks" Daily Activity  Outcome Measure Difficulty turning over in bed (including adjusting bedclothes, sheets and blankets)?: None Difficulty moving from lying on back to sitting on the side of the bed? : None Difficulty sitting down on and  standing up from a chair with arms (e.g., wheelchair, bedside commode, etc,.)?: A Little Help needed moving to and from a bed to chair (including a wheelchair)?: A Little Help needed walking in hospital room?: A Little Help needed climbing 3-5 steps with a railing? : A Lot 6 Click Score: 19    End of Session Equipment Utilized During Treatment: Gait belt Activity Tolerance: Patient tolerated treatment well Patient left: in chair;with call bell/phone within reach;with chair alarm set Nurse Communication: Mobility status PT Visit Diagnosis: Unsteadiness on feet (R26.81);Other abnormalities of gait and mobility (R26.89)    Time: 9147-8295 PT Time Calculation (min) (ACUTE ONLY): 30 min   Charges:   PT Evaluation $PT Eval Moderate Complexity: 1 Mod PT Treatments $Gait Training: 8-22 mins        Rolm Bookbinder, SPT Acute Rehab [phone] (pager) [phone] (office)  Hieu Herms 03/16/2018, 10:32 AM

## 2018-03-16 NOTE — Evaluation (Signed)
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 MRI on 10/30 showed acute ischemic nonhemorrhagic infarct involving the left basal ganglia.   Clinical Impression   This 52 y/o male presents with the above. At baseline pt reports he is independent with ADLs, iADLs and functional mobility.  Pt noted to have RUE weakness, decreased dynamic balance during this session. Pt completing room level functional mobility using RW with minA, currently requires minA for standing grooming and LB ADLs. Pt reports he lives at home with spouse who is able to provide 24hr supervision/asisst PRN. Pt will benefit from continued OT services while he remains in acute setting to maximize his overall safety and independence with ADLs and mobility prior to discharge. Will follow.     Follow Up Recommendations  No OT follow up;Supervision/Assistance - 24 hour(24hr initially)    Equipment Recommendations  Other (comment)(to be further assessed, may benefit from shower chair)           Precautions / Restrictions Precautions Precautions: Fall Restrictions Weight Bearing Restrictions: No      Mobility Bed Mobility Overal bed mobility: Modified Independent             General bed mobility comments: Pt able to roll from supine to sitting with increased time and effort, HOB elevated  Transfers Overall transfer level: Needs assistance Equipment used: Rolling walker (2 wheeled) Transfers: Sit to/from Stand Sit to Stand: Min guard         General transfer comment: minguard for safety and immediate standing balance; VCs hand placement     Balance Overall balance assessment: Needs assistance Sitting-balance support: Feet supported Sitting balance-Leahy Scale: Good Sitting balance - Comments: Pt initially needed walker for standing support. Able to tolerate short intervals with no support- 10 seconds.   Standing balance support: Bilateral upper extremity supported;No upper extremity supported;During functional activity(Pt initially needed walker for standing support. ) Standing balance-Leahy Scale: Fair Standing balance comment: use of RW for dynamic mobility     Tandem Stance - Right Leg: 0         High Level Balance Comments: Able to stand 10 seconds semi-tandem R LE leading            ADL either performed or assessed with clinical judgement   ADL Overall ADL's : Needs assistance/impaired Eating/Feeding: Modified independent;Sitting   Grooming: Min guard;Minimal assistance;Standing;Wash/dry hands   Upper Body Bathing: Min guard;Sitting   Lower Body Bathing: Min guard;Sit to/from stand   Upper Body Dressing : Min guard;Set up;Sitting   Lower Body Dressing: Minimal assistance;Sit to/from stand   Toilet Transfer: Minimal assistance;Ambulation;Regular Toilet;Grab bars;RW Statistician Details (indicate cue type and reason): simulated in transfer to/from EOB Toileting- Clothing Manipulation and Hygiene: Minimal assistance;Sit to/from stand       Functional mobility during ADLs: Minimal assistance;Rolling walker General ADL Comments: pt's lunch arriving start of session and pt requesting to eat; pt ambulates within room to wash hands prior to being set up for lunch     Vision Baseline Vision/History: No visual deficits(reports he probably needs glasses but currently doesn't wear) Patient Visual Report: No change from baseline Vision Assessment?: Yes;No apparent visual deficits Eye Alignment: Within  Functional Limits Ocular Range of Motion: Within Functional Limits Alignment/Gaze Preference: Within  Defined Limits Tracking/Visual Pursuits: Able to track stimulus in all quads without difficulty Visual Fields: No apparent deficits     Perception     Praxis      Pertinent Vitals/Pain Pain Assessment: No/denies pain Faces Pain Scale: No hurt     Hand Dominance Right   Extremity/Trunk Assessment Upper Extremity Assessment Upper Extremity Assessment: RUE deficits/detail RUE Deficits / Details: mild weakness noted in RUE, grossly 3+/5, good grip strength and fine motor appears WFL RUE Sensation: (denies numbness/tingling)   Lower Extremity Assessment Lower Extremity Assessment: Defer to PT evaluation RLE Deficits / Details: decreased LE strength. 4/5 for both PF/DF RLE Sensation: WNL   Cervical / Trunk Assessment Cervical / Trunk Assessment: Normal   Communication Communication Communication: No difficulties   Cognition Arousal/Alertness: Awake/alert Behavior During Therapy: WFL for tasks assessed/performed Overall Cognitive Status: Within Functional Limits for tasks assessed                                 General Comments: Pt was alert and oriented   General Comments  Wrap on L knee     Exercises     Shoulder Instructions      Home Living Family/patient expects to be discharged to:: Private residence Living Arrangements: Spouse/significant other Available Help at Discharge: Family;Available 24 hours/day Type of Home: House Home Access: Level entry     Home Layout: One level     Bathroom Shower/Tub: Chief Strategy Officer: Standard Bathroom Accessibility: Yes   Home Equipment: None          Prior Functioning/Environment Level of Independence: Independent        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, was working in maintenance         OT Problem List: Decreased strength;Decreased activity tolerance;Impaired balance (sitting and/or standing)      OT Treatment/Interventions: Self-care/ADL training;Therapeutic  exercise;Therapeutic activities;Balance training;Patient/family education    OT Goals(Current goals can be found in the care plan section) Acute Rehab OT Goals Patient Stated Goal: To get back to normal  OT Goal Formulation: With patient Time For Goal Achievement: 03/30/18 Potential to Achieve Goals: Good  OT Frequency: Min 2X/week   Barriers to D/C:            Co-evaluation              AM-PAC PT "6 Clicks" Daily Activity     Outcome Measure Help from another person eating meals?: None Help from another person taking care of personal grooming?: A Little Help from another person toileting, which includes using toliet, bedpan, or urinal?: A Little Help from another person bathing (including washing, rinsing, drying)?: A Little Help from another person to put on and taking off regular upper body clothing?: None Help from another person to put on and taking off regular lower body clothing?: A Little 6 Click Score: 20   End of Session Equipment Utilized During Treatment: Gait belt;Rolling walker Nurse Communication: Mobility status  Activity Tolerance: Patient tolerated treatment well Patient left: in bed;with call bell/phone within reach;with bed alarm set  OT Visit Diagnosis: Muscle weakness (generalized) (M62.81);Unsteadiness on feet (R26.81);Other symptoms and signs involving the nervous system (R29.898)                Time: 1117-1130 OT Time Calculation (min): 13 min Charges:  OT General Charges $OT Visit: 1  Visit OT Evaluation $OT Eval Low Complexity: 1 Low  Marcy Siren, OT Supplemental Rehabilitation Services Pager [phone] Office [phone]   Eric Patterson 03/16/2018, 12:08 PM

## 2018-03-16 NOTE — Progress Notes (Signed)
    [facility] has been requested to perform a transesophageal echocardiogram on 03/17/18 for stroke.  After careful review of history and examination, the risks and benefits of transesophageal echocardiogram have been explained including risks of esophageal damage, perforation (1:10,000 risk), bleeding, pharyngeal hematoma as well as other potential complications associated with conscious sedation including aspiration, arrhythmia, respiratory failure and death. Alternatives to treatment were discussed, questions were answered. Patient is willing to proceed. Labs and vital signs stable. Discussed with patient and wife.   Laverda Page, NP-C 03/16/2018 4:31 PM

## 2018-03-16 NOTE — Progress Notes (Addendum)
PROGRESS NOTE    Eric Patterson  ZOX:[id number] DOB: Jul 18, 1965 DOA: 03/15/2018 PCP: Veryl Speak, FNP  Brief Narrative:Eric Patterson is a 52 y.o. 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  He says his symptoms began suddenly - he noticed that his speech was slurred, also noticed worsening weakness in his right lower extremity compared to baseline for the last 2 days. -MRI noted acute ischemic infarct involving left basal ganglia  Assessment & Plan:   Acute left basal ganglia infarct -With resultant facial droop, mild worsening right hemiplegia -MRA notes mild to moderate intracranial atherosclerosis, no proximal high-grade stenosis -Was on aspirin at baseline following prior stroke but poorly compliant, now on aspirin and Plavix daily -FU ECHo and Carotid duplex -LDL is 101, started on statin -Hemoglobin A1c is 5.3 -Neurology consulting -PT/OT/SLP evaluations today    Hypertension -Poorly compliant with HCTZ/telmisartan -His BP was 240 x 120 on arrival to ED last night, was briefly on nicardipine drip which has since been discontinued -Continue to hold home antihypertensives for permissive hypertension    Hyperlipidemia -Started atorvastatin 40 mg daily    Tobacco use -Smokes 1.5 packs/day, counseled  DVT prophylaxis: Lovenox Code Status: Full code Family Communication: No family at bedside Disposition Plan: Await therapy evaluations  Consultants:   Neurology   Procedures:   Antimicrobials:    Subjective: -He is feeling a little better -Speech is improving, facial weakness better than yesterday, not back to normal yet  Objective: Vitals:   03/16/18 0250 03/16/18 0506 03/16/18 0701 03/16/18 0944  BP: (!) 146/91 (!) 150/92 (!) 168/98 (!) 158/106    Pulse: (!) 52 (!) 49 (!) 47 (!) 55  Resp: 18 19 19 17   Temp: (!) 97.5 F (36.4 C) (!) 97.5 F (36.4 C) 97.6 F (36.4 C) (!) 97.5 F (36.4 C)  TempSrc: Oral Oral Oral Oral  SpO2: 100% 100% 100% 100%  Weight:      Height:        Intake/Output Summary (Last 24 hours) at 03/16/2018 1020 Last data filed at 03/16/2018 0947 Gross per 24 hour  Intake 150.1 ml  Output -  Net 150.1 ml   Filed Weights   03/15/18 2006  Weight: 79.4 kg    Examination:  General exam: Appears calm and comfortable  Respiratory system: Clear to auscultation. Respiratory effort normal. Cardiovascular system: S1 & S2 heard, RRR.  Gastrointestinal system: Abdomen is nondistended, soft and nontender.Normal bowel sounds heard. Central nervous system: Alert and oriented, mild right facial droop, motor 4/5 in right lower extremity rest is 5 x 5, sensations, light touch intact, plantars are mute Extremities: No edema Skin: No rashes, lesions or ulcers Psychiatry: Judgement and insight appear normal. Mood & affect appropriate.     Data Reviewed:   CBC: Recent Labs  Lab 03/15/18 2008 03/15/18 2016  WBC 7.1  --   NEUTROABS 3.5  --   HGB 15.5 16.7  HCT 47.9 49.0  MCV 92.6  --   PLT 386  --    Basic Metabolic Panel: Recent Labs  Lab 03/15/18 2008 03/15/18 2016  NA 138 141  K 3.5 3.6  CL 107 107  CO2 23  --   GLUCOSE 88 92  BUN 15 17  CREATININE 1.30* 1.40*  CALCIUM 9.6  --    GFR: Estimated  Creatinine Clearance: 61.8 mL/min (A) (by C-G formula based on SCr of 1.4 mg/dL (H)). Liver Function Tests: Recent Labs  Lab 03/15/18 2008  AST 27  ALT 25  ALKPHOS 77  BILITOT 1.0  PROT 8.5*  ALBUMIN 4.3   No results for input(s): LIPASE, AMYLASE in the last 168 hours. No results for input(s): AMMONIA in the last 168 hours. Coagulation Profile: Recent Labs  Lab 03/15/18 2008  INR 1.09   Cardiac Enzymes: Recent Labs  Lab 03/16/18 0513  CKTOTAL 212   BNP (last 3 results) No results  for input(s): PROBNP in the last 8760 hours. HbA1C: Recent Labs    03/16/18 0513  HGBA1C 5.3   CBG: Recent Labs  Lab 03/15/18 2002  GLUCAP 82   Lipid Profile: Recent Labs    03/16/18 0513  CHOL 150  HDL 38*  LDLCALC 101*  TRIG 54  CHOLHDL 3.9   Thyroid Function Tests: No results for input(s): TSH, T4TOTAL, FREET4, T3FREE, THYROIDAB in the last 72 hours. Anemia Panel: No results for input(s): VITAMINB12, FOLATE, FERRITIN, TIBC, IRON, RETICCTPCT in the last 72 hours. Urine analysis:    Component Value Date/Time   LABSPEC 1.010 02/10/2015 2105   PHURINE 6.0 02/10/2015 2105   GLUCOSEU NEGATIVE 02/10/2015 2105   HGBUR TRACE (A) 02/10/2015 2105   BILIRUBINUR NEGATIVE 02/10/2015 2105   KETONESUR NEGATIVE 02/10/2015 2105   PROTEINUR NEGATIVE 02/10/2015 2105   UROBILINOGEN 1.0 02/10/2015 2105   NITRITE NEGATIVE 02/10/2015 2105   LEUKOCYTESUR NEGATIVE 02/10/2015 2105   Sepsis Labs: @LABRCNTIP (procalcitonin:4,lacticidven:4)  )No results found for this or any previous visit (from the past 240 hour(s)).       Radiology Studies: Ct Head Wo Contrast  Result Date: 03/15/2018 CLINICAL DATA:  Right-sided facial droop EXAM: CT HEAD WITHOUT CONTRAST TECHNIQUE: Contiguous axial images were obtained from the base of the skull through the vertex without intravenous contrast. COMPARISON:  11/29/2015 FINDINGS: Brain: New area of hypodensity, age indeterminate but likely recent involving the left basal ganglia and corona radiata suspicious for a nonhemorrhagic infarct. Smaller hypodensities in the left basal ganglia and along the right caudate body have the appearance of more remote infarcts. No acute intracranial hemorrhage, midline shift or edema. No large vascular territory infarction. No hydrocephalus, intra or extra-axial mass or extra-axial fluid collections. Midline fourth ventricle basal cisterns. Vascular: No hyperdense vessel sign. Skull: No acute skull fracture. Sinuses/Orbits:  No acute finding. Other: None. IMPRESSION: Age-indeterminate but more likely a subacute to acute nonhemorrhagic infarct involving left basal ganglia and corona radiata. More remote appearing small bilateral basal ganglial and right caudate body lacunar infarcts. Electronically Signed   By: Tollie Eth M.D.   On: 03/15/2018 21:08   Mr Brain Wo Contrast (neuro Protocol)  Result Date: 03/15/2018 CLINICAL DATA:  Initial evaluation for acute right facial and right lower extremity weakness. EXAM: MRI HEAD WITHOUT CONTRAST MRA HEAD WITHOUT CONTRAST TECHNIQUE: Multiplanar, multiecho pulse sequences of the brain and surrounding structures were obtained without intravenous contrast. Angiographic images of the head were obtained using MRA technique without contrast. COMPARISON:  Prior CT from earlier the same day as well as previous MRIs from 11/28/2015. FINDINGS: MRI HEAD FINDINGS Brain: Cerebral volume within normal limits for age. Mild chronic microvascular ischemic changes present within the periventricular white matter. Small remote/chronic lacunar infarcts seen involving the posterior right corona radiata. Associated small amount of chronic hemosiderin staining. Probable tiny remote left thalamic lacunar infarct noted as well. Abnormal restricted diffusion measuring 2.4 x 1.5 x  2.4 cm seen involving the left basal ganglia, extending from the left lentiform nucleus into the caudate/corona radiata (series 3, image 34). No associated hemorrhage or mass effect. Finding corresponds with abnormality seen on prior CT. No other evidence for acute or subacute ischemia. Gray-white matter differentiation otherwise maintained. No evidence for acute intracranial hemorrhage. No mass lesion, midline shift or mass effect. No hydrocephalus. No extra-axial fluid collection. 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 Pituitary gland and sella otherwise unremarkable. Midline structures intact.  Vascular: Major intracranial vascular flow voids maintained. Skull and upper cervical spine: And Landau occipital assimilation noted. Craniocervical junction otherwise unremarkable. No focal marrow replacing lesions. Scalp soft tissues unremarkable. Sinuses/Orbits: Globes orbital soft tissues within normal limits. Scattered mucosal thickening throughout the frontal, ethmoidal, and maxillary sinuses. Superimposed air-fluid level at the right frontoethmoidal recess, suggesting acute sinusitis. No mastoid effusion. Inner ear structures normal. Other: None. MRA HEAD FINDINGS ANTERIOR CIRCULATION: Distal cervical segments of the internal carotid arteries are patent with antegrade flow. Petrous segments patent bilaterally without stenosis. Multifocal atheromatous irregularity throughout the cavernous/supraclinoid ICAs with resultant mild to moderate stenoses (up to approximately 50%), worse on the left. No made of a small 2 mm focal outpouching extending from the anterior genu of the cavernous left ICA, which could reflect a small aneurysm (series 4, image 75). ICA termini patent bilaterally. Right A1 dominant and widely patent. Absent and/or hypoplastic left A1, accounting for the diminutive left ICA is compared to the right. Normal anterior communicating artery. Anterior cerebral arteries demonstrate scattered multifocal atheromatous irregularity but are patent to their distal aspects without flow-limiting stenosis. Left M1 widely patent proximally, subsequently bifurcates early. Short-segment moderate proximal left M2 stenosis, inferior division (series 453, image 9). Right M1 demonstrates atheromatous irregularity but is patent without high-grade stenosis. Normal right MCA bifurcation. Distal small vessel atheromatous irregularity noted within the MCA branches bilaterally. POSTERIOR CIRCULATION: Vertebral arteries diminutive but widely patent to the vertebrobasilar junction without stenosis. Right vertebral artery  slightly dominant. Posterior inferior cerebral arteries not seen on this exam. Atheromatous irregularity with mild narrowing involving the mid and distal diminutive basilar artery. Superior cerebral arteries patent bilaterally. Predominant fetal type origin of the PCAs bilaterally, although a small hypoplastic left P1 segment noted. PCAs demonstrate multifocal atheromatous irregularity without high-grade stenosis, right slightly worse than left. IMPRESSION: MRI HEAD IMPRESSION: 1. Acute ischemic nonhemorrhagic infarct involving the left basal ganglia/corona radiata. 2. Underlying mild chronic small vessel ischemic disease with small remote lacunar infarct involving the right posterior corona radiata. 3. Acute fronto ethmoidal sinusitis. MRA HEAD IMPRESSION: 1. Negative intracranial MRA for large vessel occlusion. 2. Mild-to-moderate atherosclerotic change involving both the anterior and posterior circulations without proximal high-grade or correctable stenosis, most notable about the carotid siphons. 3. Short-segment moderate proximal left M2 stenosis, similar to previous. 4. 2 mm focal outpouching arising from the cavernous left ICA as above, suspicious for a possible small aneurysm. Attention at follow-up recommended. 5. Hypoplastic/absent left A1 segment with associated diminutive left ICA as compared to the right. ACAs supplied via the right carotid artery system. 6. Predominant fetal type origin of the PCAs with secondary overall diminutive vertebrobasilar system. Electronically Signed   By: Rise Mu M.D.   On: 03/15/2018 22:42   Mr Shirlee Latch (cerebral Arteries)  Result Date: 03/15/2018 CLINICAL DATA:  Initial evaluation for acute right facial and right lower extremity weakness. EXAM: MRI HEAD WITHOUT CONTRAST MRA HEAD WITHOUT CONTRAST TECHNIQUE: Multiplanar, multiecho pulse sequences of the  brain and surrounding structures were obtained without intravenous contrast. Angiographic images of the  head were obtained using MRA technique without contrast. COMPARISON:  Prior CT from earlier the same day as well as previous MRIs from 11/28/2015. FINDINGS: MRI HEAD FINDINGS Brain: Cerebral volume within normal limits for age. Mild chronic microvascular ischemic changes present within the periventricular white matter. Small remote/chronic lacunar infarcts seen involving the posterior right corona radiata. Associated small amount of chronic hemosiderin staining. Probable tiny remote left thalamic lacunar infarct noted as well. Abnormal restricted diffusion measuring 2.4 x 1.5 x 2.4 cm seen involving the left basal ganglia, extending from the left lentiform nucleus into the caudate/corona radiata (series 3, image 34). No associated hemorrhage or mass effect. Finding corresponds with abnormality seen on prior CT. No other evidence for acute or subacute ischemia. Gray-white matter differentiation otherwise maintained. No evidence for acute intracranial hemorrhage. No mass lesion, midline shift or mass effect. No hydrocephalus. No extra-axial fluid collection. 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 Pituitary gland and sella otherwise unremarkable. Midline structures intact. Vascular: Major intracranial vascular flow voids maintained. Skull and upper cervical spine: And Landau occipital assimilation noted. Craniocervical junction otherwise unremarkable. No focal marrow replacing lesions. Scalp soft tissues unremarkable. Sinuses/Orbits: Globes orbital soft tissues within normal limits. Scattered mucosal thickening throughout the frontal, ethmoidal, and maxillary sinuses. Superimposed air-fluid level at the right frontoethmoidal recess, suggesting acute sinusitis. No mastoid effusion. Inner ear structures normal. Other: None. MRA HEAD FINDINGS ANTERIOR CIRCULATION: Distal cervical segments of the internal carotid arteries are patent with antegrade flow. Petrous segments patent  bilaterally without stenosis. Multifocal atheromatous irregularity throughout the cavernous/supraclinoid ICAs with resultant mild to moderate stenoses (up to approximately 50%), worse on the left. No made of a small 2 mm focal outpouching extending from the anterior genu of the cavernous left ICA, which could reflect a small aneurysm (series 4, image 75). ICA termini patent bilaterally. Right A1 dominant and widely patent. Absent and/or hypoplastic left A1, accounting for the diminutive left ICA is compared to the right. Normal anterior communicating artery. Anterior cerebral arteries demonstrate scattered multifocal atheromatous irregularity but are patent to their distal aspects without flow-limiting stenosis. Left M1 widely patent proximally, subsequently bifurcates early. Short-segment moderate proximal left M2 stenosis, inferior division (series 453, image 9). Right M1 demonstrates atheromatous irregularity but is patent without high-grade stenosis. Normal right MCA bifurcation. Distal small vessel atheromatous irregularity noted within the MCA branches bilaterally. POSTERIOR CIRCULATION: Vertebral arteries diminutive but widely patent to the vertebrobasilar junction without stenosis. Right vertebral artery slightly dominant. Posterior inferior cerebral arteries not seen on this exam. Atheromatous irregularity with mild narrowing involving the mid and distal diminutive basilar artery. Superior cerebral arteries patent bilaterally. Predominant fetal type origin of the PCAs bilaterally, although a small hypoplastic left P1 segment noted. PCAs demonstrate multifocal atheromatous irregularity without high-grade stenosis, right slightly worse than left. IMPRESSION: MRI HEAD IMPRESSION: 1. Acute ischemic nonhemorrhagic infarct involving the left basal ganglia/corona radiata. 2. Underlying mild chronic small vessel ischemic disease with small remote lacunar infarct involving the right posterior corona radiata. 3.  Acute fronto ethmoidal sinusitis. MRA HEAD IMPRESSION: 1. Negative intracranial MRA for large vessel occlusion. 2. Mild-to-moderate atherosclerotic change involving both the anterior and posterior circulations without proximal high-grade or correctable stenosis, most notable about the carotid siphons. 3. Short-segment moderate proximal left M2 stenosis, similar to previous. 4. 2 mm focal outpouching arising from the cavernous left ICA as above, suspicious for a possible small  aneurysm. Attention at follow-up recommended. 5. Hypoplastic/absent left A1 segment with associated diminutive left ICA as compared to the right. ACAs supplied via the right carotid artery system. 6. Predominant fetal type origin of the PCAs with secondary overall diminutive vertebrobasilar system. Electronically Signed   By: Rise Mu M.D.   On: 03/15/2018 22:42        Scheduled Meds: . aspirin EC  81 mg Oral Daily  . atorvastatin  40 mg Oral q1800  . clopidogrel  75 mg Oral Daily  . enoxaparin (LOVENOX) injection  40 mg Subcutaneous Q24H   Continuous Infusions:   LOS: 0 days    Time spent:    Zannie Cove, MD [facility] Page via [email or web address], password [email or web address] After 7PM please contact night-coverage  03/16/2018, 10:20 AM

## 2018-03-16 NOTE — Progress Notes (Addendum)
STROKE TEAM PROGRESS NOTE  HPI:( Dr Otelia Limes ) Eric Patterson is an 52 y.o. male who presents to the ED with new onset of right face weakness since 7 AM 03/15/18. He also states that he has been dragging his RLE more than usual since Monday. He has a prior history of stroke with residual RLE weakness.  CT head in the ED revealed a subacute left putamen/caudate ischemic infarction.  LSN: 7 AM 03/15/18 tPA Given: No: Out of time window  INTERVAL HISTORY No family is at the bedside.  He is stable overnight. Still has some weakness and facial droop. Dr. Pearlean Brownie discussed the axiomatic research study w/ pt. He is interested. Research staff will followup.  Vitals:   03/16/18 0250 03/16/18 0506 03/16/18 0701 03/16/18 0944  BP: (!) 146/91 (!) 150/92 (!) 168/98 (!) 158/106  Pulse: (!) 52 (!) 49 (!) 47 (!) 55  Resp: 18 19 19 17   Temp: (!) 97.5 F (36.4 C) (!) 97.5 F (36.4 C) 97.6 F (36.4 C) (!) 97.5 F (36.4 C)  TempSrc: Oral Oral Oral Oral  SpO2: 100% 100% 100% 100%  Weight:      Height:        CBC:  Recent Labs  Lab 03/15/18 2008 03/15/18 2016  WBC 7.1  --   NEUTROABS 3.5  --   HGB 15.5 16.7  HCT 47.9 49.0  MCV 92.6  --   PLT 386  --     Basic Metabolic Panel:  Recent Labs  Lab 03/15/18 2008 03/15/18 2016  NA 138 141  K 3.5 3.6  CL 107 107  CO2 23  --   GLUCOSE 88 92  BUN 15 17  CREATININE 1.30* 1.40*  CALCIUM 9.6  --    Lipid Panel:     Component Value Date/Time   CHOL 150 03/16/2018 0513   TRIG 54 03/16/2018 0513   HDL 38 (L) 03/16/2018 0513   CHOLHDL 3.9 03/16/2018 0513   VLDL 11 03/16/2018 0513   LDLCALC 101 (H) 03/16/2018 0513   HgbA1c:  Lab Results  Component Value Date   HGBA1C 5.3 03/16/2018   Urine Drug Screen:     Component Value Date/Time   LABOPIA NONE DETECTED 03/16/2018 0720   COCAINSCRNUR NONE DETECTED 03/16/2018 0720   LABBENZ NONE DETECTED 03/16/2018 0720   AMPHETMU NONE DETECTED 03/16/2018 0720   THCU NONE DETECTED 03/16/2018 0720    LABBARB NONE DETECTED 03/16/2018 0720    Alcohol Level No results found for: ETH  IMAGING Ct Head Wo Contrast  Result Date: 03/15/2018 CLINICAL DATA:  Right-sided facial droop EXAM: CT HEAD WITHOUT CONTRAST TECHNIQUE: Contiguous axial images were obtained from the base of the skull through the vertex without intravenous contrast. COMPARISON:  11/29/2015 FINDINGS: Brain: New area of hypodensity, age indeterminate but likely recent involving the left basal ganglia and corona radiata suspicious for a nonhemorrhagic infarct. Smaller hypodensities in the left basal ganglia and along the right caudate body have the appearance of more remote infarcts. No acute intracranial hemorrhage, midline shift or edema. No large vascular territory infarction. No hydrocephalus, intra or extra-axial mass or extra-axial fluid collections. Midline fourth ventricle basal cisterns. Vascular: No hyperdense vessel sign. Skull: No acute skull fracture. Sinuses/Orbits: No acute finding. Other: None. IMPRESSION: Age-indeterminate but more likely a subacute to acute nonhemorrhagic infarct involving left basal ganglia and corona radiata. More remote appearing small bilateral basal ganglial and right caudate body lacunar infarcts. Electronically Signed   By: Rene Kocher.D.  On: 03/15/2018 21:08   Mr Brain Wo Contrast (neuro Protocol)  Result Date: 03/15/2018 CLINICAL DATA:  Initial evaluation for acute right facial and right lower extremity weakness. EXAM: MRI HEAD WITHOUT CONTRAST MRA HEAD WITHOUT CONTRAST TECHNIQUE: Multiplanar, multiecho pulse sequences of the brain and surrounding structures were obtained without intravenous contrast. Angiographic images of the head were obtained using MRA technique without contrast. COMPARISON:  Prior CT from earlier the same day as well as previous MRIs from 11/28/2015. FINDINGS: MRI HEAD FINDINGS Brain: Cerebral volume within normal limits for age. Mild chronic microvascular ischemic changes  present within the periventricular white matter. Small remote/chronic lacunar infarcts seen involving the posterior right corona radiata. Associated small amount of chronic hemosiderin staining. Probable tiny remote left thalamic lacunar infarct noted as well. Abnormal restricted diffusion measuring 2.4 x 1.5 x 2.4 cm seen involving the left basal ganglia, extending from the left lentiform nucleus into the caudate/corona radiata (series 3, image 34). No associated hemorrhage or mass effect. Finding corresponds with abnormality seen on prior CT. No other evidence for acute or subacute ischemia. Gray-white matter differentiation otherwise maintained. No evidence for acute intracranial hemorrhage. No mass lesion, midline shift or mass effect. No hydrocephalus. No extra-axial fluid collection. 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 Pituitary gland and sella otherwise unremarkable. Midline structures intact. Vascular: Major intracranial vascular flow voids maintained. Skull and upper cervical spine: And Landau occipital assimilation noted. Craniocervical junction otherwise unremarkable. No focal marrow replacing lesions. Scalp soft tissues unremarkable. Sinuses/Orbits: Globes orbital soft tissues within normal limits. Scattered mucosal thickening throughout the frontal, ethmoidal, and maxillary sinuses. Superimposed air-fluid level at the right frontoethmoidal recess, suggesting acute sinusitis. No mastoid effusion. Inner ear structures normal. Other: None. MRA HEAD FINDINGS ANTERIOR CIRCULATION: Distal cervical segments of the internal carotid arteries are patent with antegrade flow. Petrous segments patent bilaterally without stenosis. Multifocal atheromatous irregularity throughout the cavernous/supraclinoid ICAs with resultant mild to moderate stenoses (up to approximately 50%), worse on the left. No made of a small 2 mm focal outpouching extending from the anterior genu of the  cavernous left ICA, which could reflect a small aneurysm (series 4, image 75). ICA termini patent bilaterally. Right A1 dominant and widely patent. Absent and/or hypoplastic left A1, accounting for the diminutive left ICA is compared to the right. Normal anterior communicating artery. Anterior cerebral arteries demonstrate scattered multifocal atheromatous irregularity but are patent to their distal aspects without flow-limiting stenosis. Left M1 widely patent proximally, subsequently bifurcates early. Short-segment moderate proximal left M2 stenosis, inferior division (series 453, image 9). Right M1 demonstrates atheromatous irregularity but is patent without high-grade stenosis. Normal right MCA bifurcation. Distal small vessel atheromatous irregularity noted within the MCA branches bilaterally. POSTERIOR CIRCULATION: Vertebral arteries diminutive but widely patent to the vertebrobasilar junction without stenosis. Right vertebral artery slightly dominant. Posterior inferior cerebral arteries not seen on this exam. Atheromatous irregularity with mild narrowing involving the mid and distal diminutive basilar artery. Superior cerebral arteries patent bilaterally. Predominant fetal type origin of the PCAs bilaterally, although a small hypoplastic left P1 segment noted. PCAs demonstrate multifocal atheromatous irregularity without high-grade stenosis, right slightly worse than left. IMPRESSION: MRI HEAD IMPRESSION: 1. Acute ischemic nonhemorrhagic infarct involving the left basal ganglia/corona radiata. 2. Underlying mild chronic small vessel ischemic disease with small remote lacunar infarct involving the right posterior corona radiata. 3. Acute fronto ethmoidal sinusitis. MRA HEAD IMPRESSION: 1. Negative intracranial MRA for large vessel occlusion. 2. Mild-to-moderate atherosclerotic change involving both the  anterior and posterior circulations without proximal high-grade or correctable stenosis, most notable about  the carotid siphons. 3. Short-segment moderate proximal left M2 stenosis, similar to previous. 4. 2 mm focal outpouching arising from the cavernous left ICA as above, suspicious for a possible small aneurysm. Attention at follow-up recommended. 5. Hypoplastic/absent left A1 segment with associated diminutive left ICA as compared to the right. ACAs supplied via the right carotid artery system. 6. Predominant fetal type origin of the PCAs with secondary overall diminutive vertebrobasilar system. Electronically Signed   By: Rise Mu M.D.   On: 03/15/2018 22:42   Mr Shirlee Latch (cerebral Arteries)  Result Date: 03/15/2018 CLINICAL DATA:  Initial evaluation for acute right facial and right lower extremity weakness. EXAM: MRI HEAD WITHOUT CONTRAST MRA HEAD WITHOUT CONTRAST TECHNIQUE: Multiplanar, multiecho pulse sequences of the brain and surrounding structures were obtained without intravenous contrast. Angiographic images of the head were obtained using MRA technique without contrast. COMPARISON:  Prior CT from earlier the same day as well as previous MRIs from 11/28/2015. FINDINGS: MRI HEAD FINDINGS Brain: Cerebral volume within normal limits for age. Mild chronic microvascular ischemic changes present within the periventricular white matter. Small remote/chronic lacunar infarcts seen involving the posterior right corona radiata. Associated small amount of chronic hemosiderin staining. Probable tiny remote left thalamic lacunar infarct noted as well. Abnormal restricted diffusion measuring 2.4 x 1.5 x 2.4 cm seen involving the left basal ganglia, extending from the left lentiform nucleus into the caudate/corona radiata (series 3, image 34). No associated hemorrhage or mass effect. Finding corresponds with abnormality seen on prior CT. No other evidence for acute or subacute ischemia. Gray-white matter differentiation otherwise maintained. No evidence for acute intracranial hemorrhage. No mass lesion,  midline shift or mass effect. No hydrocephalus. No extra-axial fluid collection. 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 Pituitary gland and sella otherwise unremarkable. Midline structures intact. Vascular: Major intracranial vascular flow voids maintained. Skull and upper cervical spine: And Landau occipital assimilation noted. Craniocervical junction otherwise unremarkable. No focal marrow replacing lesions. Scalp soft tissues unremarkable. Sinuses/Orbits: Globes orbital soft tissues within normal limits. Scattered mucosal thickening throughout the frontal, ethmoidal, and maxillary sinuses. Superimposed air-fluid level at the right frontoethmoidal recess, suggesting acute sinusitis. No mastoid effusion. Inner ear structures normal. Other: None. MRA HEAD FINDINGS ANTERIOR CIRCULATION: Distal cervical segments of the internal carotid arteries are patent with antegrade flow. Petrous segments patent bilaterally without stenosis. Multifocal atheromatous irregularity throughout the cavernous/supraclinoid ICAs with resultant mild to moderate stenoses (up to approximately 50%), worse on the left. No made of a small 2 mm focal outpouching extending from the anterior genu of the cavernous left ICA, which could reflect a small aneurysm (series 4, image 75). ICA termini patent bilaterally. Right A1 dominant and widely patent. Absent and/or hypoplastic left A1, accounting for the diminutive left ICA is compared to the right. Normal anterior communicating artery. Anterior cerebral arteries demonstrate scattered multifocal atheromatous irregularity but are patent to their distal aspects without flow-limiting stenosis. Left M1 widely patent proximally, subsequently bifurcates early. Short-segment moderate proximal left M2 stenosis, inferior division (series 453, image 9). Right M1 demonstrates atheromatous irregularity but is patent without high-grade stenosis. Normal right MCA bifurcation.  Distal small vessel atheromatous irregularity noted within the MCA branches bilaterally. POSTERIOR CIRCULATION: Vertebral arteries diminutive but widely patent to the vertebrobasilar junction without stenosis. Right vertebral artery slightly dominant. Posterior inferior cerebral arteries not seen on this exam. Atheromatous irregularity with mild narrowing involving the  mid and distal diminutive basilar artery. Superior cerebral arteries patent bilaterally. Predominant fetal type origin of the PCAs bilaterally, although a small hypoplastic left P1 segment noted. PCAs demonstrate multifocal atheromatous irregularity without high-grade stenosis, right slightly worse than left. IMPRESSION: MRI HEAD IMPRESSION: 1. Acute ischemic nonhemorrhagic infarct involving the left basal ganglia/corona radiata. 2. Underlying mild chronic small vessel ischemic disease with small remote lacunar infarct involving the right posterior corona radiata. 3. Acute fronto ethmoidal sinusitis. MRA HEAD IMPRESSION: 1. Negative intracranial MRA for large vessel occlusion. 2. Mild-to-moderate atherosclerotic change involving both the anterior and posterior circulations without proximal high-grade or correctable stenosis, most notable about the carotid siphons. 3. Short-segment moderate proximal left M2 stenosis, similar to previous. 4. 2 mm focal outpouching arising from the cavernous left ICA as above, suspicious for a possible small aneurysm. Attention at follow-up recommended. 5. Hypoplastic/absent left A1 segment with associated diminutive left ICA as compared to the right. ACAs supplied via the right carotid artery system. 6. Predominant fetal type origin of the PCAs with secondary overall diminutive vertebrobasilar system. Electronically Signed   By: Rise Mu M.D.   On: 03/15/2018 22:42   2D Echocardiogram  - Left ventricle: The cavity size was normal. Systolic function was   normal. The estimated ejection fraction was in the  range of 60%   to 65%. Wall motion was normal; there were no regional wall   motion abnormalities. Left ventricular diastolic function   parameters were normal. - Atrial septum: No defect or patent foramen ovale was identified.   PHYSICAL EXAM Pleasant middle aged african [place] male not in distress. . Afebrile. Head is nontraumatic. Neck is supple without bruit.    Cardiac exam no murmur or gallop. Lungs are clear to auscultation. Distal pulses are well felt. Neurological Exam :  Awake alert oriented x 3 .follows commands [email or web address] [email or web address]  speech   Mild right lower face asymmetry. Tongue midline. Mild right upper and lower extremity drift. Mild diminished fine finger movements on right. Orbits left over right upper extremity. Mild right   grip , hand and hip flexorweakness.. Normal sensation . Normal coordination. NIHSS 4 Premorbid MRS 0  ASSESSMENT/PLAN [name] is a 52 y.o. male with history of prior stroke w/ resultant RLE weakness, HTN, HLD presenting with R facial and RLE weakness.   Stroke:  Large left basal ganglia/corona radiata infarct embolic secondary to unknown source  CT head subacute to acute L BG and CR stroke. Old B BG and R caudate infarcts.    MRI  L BG/CR infarct. Old R posterior CR lacune. Small vessel disease.   MRA  Mild to mod anterior and posterior atherosclerosis. Short segment stenosis L M2  Carotid Doppler  B ICA 1-39% stenosis, VAs antegrade   2D Echo  EF 60-65%. No source of embolus   TEE to look for embolic source. Arranged with [facility] for tomorrow.  If positive for PFO (patent foramen ovale), check bilateral lower extremity venous dopplers to rule out DVT as possible source of stroke. (I have made patient NPO after midnight tonight).  If TEE negative, a [facility] electrophysiologist will consult and consider placement of an implantable loop recorder to evaluate for atrial  fibrillation as etiology of stroke. This has been explained to patient/family by Dr. Pearlean Brownie and he is agreeable.   LDL 101  HgbA1c 5.3  Lovenox 40 mg sq daily for VTE prophylaxis  aspirin 81 mg daily prior to admission,  now on aspirin 81 mg daily and clopidogrel 75 mg daily. Continue DAPT x 3 weeks then plavix alone  Therapy recommendations:  OP PT, no OT  Disposition:  Return home  Hypertensive Emergency  BP 239/124 on arrival  Improved this am . Permissive hypertension (OK if < 220/120) but gradually normalize in 5-7 days . Long-term BP goal normotensive  Hyperlipidemia  Home meds:  No statin  LDL 101, goal < 70  Now on lipitor 40 mg daily   Continue statin at discharge  Other Stroke Risk Factors  Cigarette smoker, advised to stop smoking - reports his last cigarette was yesterday and he stopped  ETOH use, advised to drink no more than 2 drink(s) a day  Hx stroke/TIA  11/2015 R posterior corona radiata and posterior external capsule infarct due to small vessel disease with mult risk factors  Family hx stroke (other, brother)  Carotid stenosis - 11/2015 - R ICA 40-59% stenosis  Hospital day # 0  Annie Main, MSN, APRN, ANVP-BC, AGPCNP-BC Advanced Practice Stroke Nurse [facility] See Amion for Schedule & Pager information 03/16/2018 11:57 AM  I have personally examined this patient, reviewed notes, independently viewed imaging studies, participated in medical decision making and plan of care.ROS completed by me personally and pertinent positives fully documented  I have made any additions or clarifications directly to the above note. Agree with note above.  With slurred speech and right-sided weakness due to large left basal ganglia infarct. CT angio shows atherosclerotic changes involving carotid siphon and intracranial vessels but no large vessel stenosis. Recommend continue ongoing stroke workup and dual antiplatelet therapy for 3 weeks. Patient may  benefit with consideration for participation in the AXIOMATIC stroke prevention trial if interested. Greater than 50% time during this 35 minute visit was spent on counseling and coordination of care about his stroke. Discussed with Dr. Jomarie Longs. Delia Heady, MD Medical Director [facility] Pager: [phone] 03/16/2018 2:34 PM  To contact Stroke Continuity provider, please refer to [email or web address]. After hours, contact General Neurology

## 2018-03-16 NOTE — Progress Notes (Signed)
VASCULAR LAB PRELIMINARY  PRELIMINARY  PRELIMINARY  PRELIMINARY  Carotid duplex completed.    Preliminary report:  1-39% ICA stenosis.  Vertebral artery flow is antegrade.   Jessaca Philippi, RVT 03/16/2018, 10:39 AM

## 2018-03-16 NOTE — Progress Notes (Signed)
  Echocardiogram 2D Echocardiogram has been performed.  Delcie Roch 03/16/2018, 10:31 AM

## 2018-03-16 NOTE — Care Management Note (Signed)
Case Management Note  Patient Details  Name: Eric Patterson MRN: [id number] Date of Birth: 06-23-1965  Subjective/Objective:      Pt admitted with a stroke. He is from home with his spouse and [facility]AD[facility]. He denies any DME other than an elevated commode.  Pt denies issues with obtaining his medications. No insurance listed but patient states he has insurance. Wife to bring card.      Pt denies issues with transportation.          Action/Plan: PT recommending Outpatient therapy. CM following.   Expected Discharge Date:                  Expected Discharge Plan:  OP Rehab  In-House Referral:     Discharge planning Services  CM Consult  Post Acute Care Choice:    Choice offered to:     DME Arranged:    DME Agency:     HH Arranged:    HH Agency:     Status of Service:  In process, will continue to follow  If discussed at Long Length of Stay Meetings, dates discussed:    Additional Comments:  Kermit Balo, RN 03/16/2018, 11:09 AM

## 2018-03-16 NOTE — Evaluation (Signed)
SLP Cancellation Note  Patient Details Name: Eric Patterson MRN: [id number] DOB: Nov 04, 1965   Cancelled treatment:       Reason Eval/Treat Not Completed: Other (comment);Patient at procedure or test/unavailable(pt at [facility] per location in chart and RN confirmed, will continue efforts)   Chales Abrahams 03/16/2018, 10:44 AM   Donavan Burnet, MS [facility] SLP Acute Rehab Services Pager [phone] Office [phone]

## 2018-03-16 NOTE — Plan of Care (Signed)
Patient stable, discussed stroke POC with patient, agreeable with plan, denies question/concerns at this time.  

## 2018-03-16 NOTE — Progress Notes (Signed)
PT Progress Note for Charges    03/16/18 1000  PT General Charges  $$ ACUTE PT VISIT 1 Visit  PT Evaluation  $PT Eval Moderate Complexity 1 Mod  PT Treatments  $Gait Training 8-22 mins  Deborah Chalk, [place], DPT  Acute Rehabilitation Services Pager [phone] Office [phone]

## 2018-03-17 ENCOUNTER — Encounter (HOSPITAL_COMMUNITY)
Admission: EM | Disposition: A | Discharge status: Home / Self Care | Payer: Self-pay | Source: Home / Self Care | Attending: Internal Medicine

## 2018-03-17 ENCOUNTER — Inpatient Hospital Stay (HOSPITAL_COMMUNITY): Payer: 59

## 2018-03-17 ENCOUNTER — Ambulatory Visit (HOSPITAL_COMMUNITY): Admit: 2018-03-17 | Payer: 59 | Admitting: Cardiology

## 2018-03-17 ENCOUNTER — Inpatient Hospital Stay (HOSPITAL_COMMUNITY): Payer: 59 | Admitting: Anesthesiology

## 2018-03-17 ENCOUNTER — Encounter (HOSPITAL_COMMUNITY): Payer: Self-pay | Admitting: *Deleted

## 2018-03-17 DIAGNOSIS — I6389 Other cerebral infarction: Secondary | ICD-10-CM

## 2018-03-17 HISTORY — PX: TEE WITHOUT CARDIOVERSION: SHX5443

## 2018-03-17 HISTORY — PX: LOOP RECORDER INSERTION: EP1214

## 2018-03-17 SURGERY — ECHOCARDIOGRAM, TRANSESOPHAGEAL
Anesthesia: Monitor Anesthesia Care

## 2018-03-17 SURGERY — LOOP RECORDER INSERTION
Anesthesia: LOCAL

## 2018-03-17 MED ORDER — LIDOCAINE-EPINEPHRINE 1 %-1:100000 IJ SOLN
INTRAMUSCULAR | Status: DC | PRN
  Administered 2018-03-17: 30 mL

## 2018-03-17 MED ORDER — LIDOCAINE-EPINEPHRINE 1 %-1:100000 IJ SOLN
INTRAMUSCULAR | Status: AC
  Filled 2018-03-17: qty 1

## 2018-03-17 MED ORDER — AMLODIPINE BESYLATE 10 MG PO TABS: 10.0000 mg | ORAL_TABLET | Freq: Every day | ORAL | 0 refills | Status: DC

## 2018-03-17 MED ORDER — CLOPIDOGREL BISULFATE 75 MG PO TABS: 75.0000 mg | ORAL_TABLET | Freq: Every day | ORAL | 1 refills | Status: DC

## 2018-03-17 MED ORDER — PROPOFOL 10 MG/ML IV BOLUS
INTRAVENOUS | Status: DC | PRN
  Administered 2018-03-17: 40 mg via INTRAVENOUS
  Administered 2018-03-17: 20 mg via INTRAVENOUS

## 2018-03-17 MED ORDER — LIDOCAINE 2% (20 MG/ML) 5 ML SYRINGE
INTRAMUSCULAR | Status: DC | PRN
  Administered 2018-03-17: 100 mg via INTRAVENOUS

## 2018-03-17 MED ORDER — ASPIRIN 81 MG PO TBEC: 81.0000 mg | DELAYED_RELEASE_TABLET | Freq: Every day | ORAL | Status: AC

## 2018-03-17 MED ORDER — HYDRALAZINE HCL 20 MG/ML IJ SOLN
10.0000 mg | Freq: Once | INTRAMUSCULAR | Status: AC
  Administered 2018-03-17: 10 mg via INTRAVENOUS
  Filled 2018-03-17: qty 1

## 2018-03-17 MED ORDER — PROPOFOL 500 MG/50ML IV EMUL
INTRAVENOUS | Status: DC | PRN
  Administered 2018-03-17: 150 ug/kg/min via INTRAVENOUS

## 2018-03-17 MED ORDER — ATORVASTATIN CALCIUM 40 MG PO TABS: 40.0000 mg | ORAL_TABLET | Freq: Every day | ORAL | 0 refills | Status: DC

## 2018-03-17 MED ORDER — AMLODIPINE BESYLATE 10 MG PO TABS
10.0000 mg | ORAL_TABLET | Freq: Every day | ORAL | Status: DC
  Administered 2018-03-17: 10 mg via ORAL
  Filled 2018-03-17: qty 1

## 2018-03-17 SURGICAL SUPPLY — 2 items
LOOP REVEAL LINQSYS (Prosthesis & Implant Heart) ×1 IMPLANT
PACK LOOP INSERTION (CUSTOM PROCEDURE TRAY) ×2 IMPLANT

## 2018-03-17 NOTE — Anesthesia Procedure Notes (Signed)
Procedure Name: MAC Date/Time: 03/17/2018 1:27 PM Performed by: Imagene Riches, CRNA Pre-anesthesia Checklist: Patient identified, Emergency Drugs available, Suction available and Patient being monitored Patient Re-evaluated:Patient Re-evaluated prior to induction Oxygen Delivery Method: Nasal cannula

## 2018-03-17 NOTE — Progress Notes (Signed)
  Echocardiogram Transesophageal Echocardiogram has been performed.  Celene Skeen 03/17/2018, 1:43 PM

## 2018-03-17 NOTE — Transfer of Care (Signed)
Immediate Anesthesia Transfer of Care Note  Patient: Eric Patterson  Procedure(s) Performed: TRANSESOPHAGEAL ECHOCARDIOGRAM (TEE) (N/A )  Patient Location: Endoscopy Unit  Anesthesia Type:MAC  Level of Consciousness: awake  Airway & Oxygen Therapy: Patient Spontanous Breathing  Post-op Assessment: Report given to RN and Post -op Vital signs reviewed and stable  Post vital signs: Reviewed and stable  Last Vitals:  Vitals Value Taken Time  BP 154/91 03/17/2018  1:37 PM  Temp    Pulse 65 03/17/2018  1:37 PM  Resp 22 03/17/2018  1:37 PM  SpO2 100 % 03/17/2018  1:37 PM  Vitals shown include unvalidated device data.  Last Pain:  Vitals:   03/17/18 1244  TempSrc: Oral  PainSc: 0-No pain         Complications: No apparent anesthesia complications

## 2018-03-17 NOTE — Evaluation (Signed)
Speech Language Pathology Evaluation Patient Details Name: Eric Patterson MRN: [id number] DOB: [birth year] Today's Date: 03/17/2018 Time: 1308-6578 SLP Time Calculation (min) (ACUTE ONLY): 21 min  Problem List:  Patient Active Problem List   Diagnosis Date Noted  . Tobacco use 03/16/2018  . Stroke (HCC) 03/16/2018  . Infarction of left basal ganglia (HCC) 03/15/2018  . Hyperlipidemia LDL goal <70 10/21/2016  . Chronic left-sided low back pain without sciatica 10/21/2016  . Hypertension 11/28/2015  . Lacunar infarction (HCC) 11/28/2015   Past Medical History:  Past Medical History:  Diagnosis Date  . Hyperlipidemia   . Hypertension   . Stroke [facility]) 2017   R side weakness deficits   Past Surgical History: History reviewed. No pertinent surgical history. 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 MRI on 10/30 showed acute ischemic nonhemorrhagic infarct involving the left basal ganglia.   Assessment / Plan / Recommendation Clinical Impression   Patient presents with mild dysarthria and mild cognitive communication impairment. Vocal quality is hoarse and low intensity, and right facial weakness noted with minimal impacts on intelligibility. He endorses some wordfinding difficulties. He initially denied cognitive changes, however when confronted with difficulties in cognitive-linguistic tasks, pt agreed he has had a decline from his baseline function. He did ID and self-correct one error (trailmaking). Slow processing noted, as well as difficulty with organization in clockdrawing, selective attention during digit repetition, and simple problem solving. He scored 21/30 on MOCA- Basic, consistent with mild cognitive impairments. I recommend follow-up in OP SLP for cognition  and dysarthria; will follow during acute stay.     SLP Assessment  SLP Recommendation/Assessment: Patient needs continued Speech Lanaguage Pathology Services SLP Visit Diagnosis: Dysarthria and anarthria (R47.1);Cognitive communication deficit (R41.841)    Follow Up Recommendations  Outpatient SLP    Frequency and Duration min 1 x/week  1 week      SLP Evaluation Cognition  Overall Cognitive Status: Impaired/Different from baseline Arousal/Alertness: Awake/alert Orientation Level: Oriented X4 Attention: Sustained;Selective Sustained Attention: Appears intact Selective Attention: Impaired(errors in digit repetition/exclusion) Selective Attention Impairment: Functional basic Memory: (delayed recall 4/5) Awareness: Impaired Awareness Impairment: Intellectual impairment;Emergent impairment;Anticipatory impairment(pt initially denies deficits) Problem Solving: Impaired Problem Solving Impairment: Verbal basic(slow processing, 2/4 simple problems) Executive Function: Landscape architect: Impaired Organizing Impairment: Functional basic(clockdrawing impaired; mod-max cues)       Comprehension  Auditory Comprehension Overall Auditory Comprehension: Appears within functional limits for tasks assessed Yes/No Questions: Within Functional Limits Commands: Within Functional Limits Conversation: Other (comment)(mod complex) Visual Recognition/Discrimination Discrimination: Not tested Reading Comprehension Reading Status: Not tested    Expression Expression Primary Mode of Expression: Verbal Verbal Expression Overall Verbal Expression: Impaired Initiation: No impairment Level of Generative/Spontaneous Verbalization: Conversation Repetition: No impairment Naming: Impairment Confrontation: (3/3 with self-correction for dysnomia x1) Divergent: 25-49% accurate(5 fruits in 60 seconds; 2 perseverations) Other Naming Comments: hesitations, occasional wordfinding  difficulties Pragmatics: No impairment Written Expression Dominant Hand: Right Written Expression: Not tested   Oral / Motor  Oral Motor/Sensory Function Overall Oral Motor/Sensory Function: Mild impairment Facial ROM: Reduced right;Suspected CN VII (facial) dysfunction Facial Symmetry: Abnormal symmetry right;Suspected CN VII (facial) dysfunction Facial Strength: Within Functional Limits Lingual ROM: Within Functional Limits Lingual Symmetry: Within Functional Limits Lingual Strength: Within Functional Limits Velum: Within Functional Limits Mandible: Within Functional Limits Motor Speech  Overall Motor Speech: Impaired Respiration: Within functional limits Phonation: Hoarse;Low vocal intensity Resonance: Within functional limits Articulation: Impaired Level of Impairment: Conversation Intelligibility: Intelligibility reduced Word: 75-100% accurate Phrase: 75-100% accurate Sentence: 75-100% accurate Conversation: 75-100% accurate(repeats needed x 3) Motor Planning: Witnin functional limits   GO                   Rondel Baton, MS, CCC-SLP Speech-Language Pathologist Acute Rehabilitation Services Pager: [phone] Office: [phone]  Arlana Lindau 03/17/2018, 12:31 PM

## 2018-03-17 NOTE — CV Procedure (Signed)
TEE: Anesthesia: Propofol  Moderate LVH EF 65% Mild MR Normal AV No ASD/PFO  Negative bubble study Normal RV Normal aorta No significant atherosclerotic debris No effusion  No source of embolus  Charlton Haws

## 2018-03-17 NOTE — Discharge Instructions (Signed)
Post implant site/wound care instructions °Keep incision clean and dry for 3 days. °You can remove outer dressing tomorrow. °Leave steri-strips (little pieces of tape) on until seen in the office for wound check appointment. °Call the office ([phone]) for redness, drainage, swelling, or fever. ° °

## 2018-03-17 NOTE — Discharge Summary (Addendum)
Physician Discharge Summary  [name] KVQ:[id number] DOB: [birth year] DOA: 03/15/2018  PCP: Veryl Speak, FNP  Admit date: 03/15/2018 Discharge date: 03/17/2018  Time spent: 45 minutes  Recommendations for Outpatient Follow-up:  1. PCP in 1 week 2. Neurology Dr.Sethi in 50month 3. 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 He says his symptoms began suddenly - he noticed that his speech was slurred, also noticed worsening weakness in his right lower extremity compared to baseline for the last 2 days.  Hospital Course:  Acute left basal ganglia infarct -With resultant facial droop, mild worsening right hemiplegia -MRA notes mild to moderate intracranial atherosclerosis, no proximal high-grade stenosis -Was on aspirin at baseline following prior stroke but poorly compliant, now on aspirin and Plavix daily -ECHo showed normal EF and Carotid duplex showed no significant stenosis -LDL is 101, started on statin -Hemoglobin A1c is 5.3 -Neurology consulted, recommended ASA 81mg  daily & Plavix 75mg  daily for 3 weeks followed by Plavix alone -PT/OT/SLP evaluations completed, outpatient PT recommended    Hypertension -Poorly compliant with HCTZ/telmisartan -His BP was 240 x 120 on arrival to ED last night, was briefly on nicardipine drip which has  since been discontinued --resumed micardis HCTZ -also started on amlodipine at DC    Hyperlipidemia -Started atorvastatin 40 mg daily    Tobacco use -Smokes 1.5 packs/day, counseled   Consultants:   Neurology  EP  Cardiology   Procedures:  -TEE and Loop recorder  Discharge Exam: Vitals:   03/17/18 1447 03/17/18 1515  BP: (!) 199/92 (!) 197/119  Pulse: (!) 47 (!) 50  Resp: 17   Temp:  97.6 F (36.4 C)  SpO2: 100% 100%    General: AAOx3 Cardiovascular: S1S2/RRR Respiratory: CTAB  Discharge Instructions   Discharge Instructions    Ambulatory referral to Physical Therapy   Complete by:  As directed    Diet - low sodium heart healthy   Complete by:  As directed    Increase activity slowly   Complete by:  As directed      Allergies as of 03/17/2018   No Known Allergies     Medication List    STOP taking these medications   cephALEXin 500 MG capsule Commonly known as:  KEFLEX   EPINEPHrine 0.3 mg/0.3 mL Soaj injection Commonly known as:  EPI-PEN   trimethoprim-polymyxin b ophthalmic solution Commonly known as:  POLYTRIM     TAKE these medications   amLODipine 10 MG tablet Commonly known as:  NORVASC Take 1 tablet (10 mg total) by mouth daily.   aspirin 81 MG EC tablet Take 1 tablet (81 mg total) by mouth daily for 21 days. STOP AFTER 3 weeks What changed:  additional instructions   atorvastatin 40 MG tablet Commonly known as:  LIPITOR Take 1 tablet (40 mg total) by mouth daily at 6 PM.   clopidogrel 75 MG  tablet Commonly known as:  PLAVIX Take 1 tablet (75 mg total) by mouth daily. Start taking on:  03/18/2018   telmisartan-hydrochlorothiazide 80-25 MG tablet Commonly known as:  MICARDIS HCT Take 1 tablet by mouth daily.      No Known Allergies Follow-up Information    [facility] Follow up.   Specialty:  Rehabilitation Why:  They will contact you for the first appointment. Contact  information: [address] [id number] mc [place] [address] [phone]       [facility] [address] Office Follow up.   Specialty:  Cardiology Why:  03/29/18 @ 3:00PM, wound check visit Contact information: [address] [place] [address] [phone]           The results of significant diagnostics from this hospitalization (including imaging, microbiology, ancillary and laboratory) are listed below for reference.    Significant Diagnostic Studies: Ct Head Wo Contrast  Result Date: 03/15/2018 CLINICAL DATA:  Right-sided facial droop EXAM: CT HEAD WITHOUT CONTRAST TECHNIQUE: Contiguous axial images were obtained from the base of the skull through the vertex without intravenous contrast. COMPARISON:  11/29/2015 FINDINGS: Brain: New area of hypodensity, age indeterminate but likely recent involving the left basal ganglia and corona radiata suspicious for a nonhemorrhagic infarct. Smaller hypodensities in the left basal ganglia and along the right caudate body have the appearance of more remote infarcts. No acute intracranial hemorrhage, midline shift or edema. No large vascular territory infarction. No hydrocephalus, intra or extra-axial mass or extra-axial fluid collections. Midline fourth ventricle basal cisterns. Vascular: No hyperdense vessel sign. Skull: No acute skull fracture. Sinuses/Orbits: No acute finding. Other: None. IMPRESSION: Age-indeterminate but more likely a subacute to acute nonhemorrhagic infarct involving left basal ganglia and corona radiata. More remote appearing small bilateral basal ganglial and right caudate body lacunar infarcts. Electronically Signed   By: Tollie Eth M.D.   On: 03/15/2018 21:08   Mr Brain Wo Contrast (neuro Protocol)  Addendum Date: 03/16/2018   ADDENDUM REPORT: 03/16/2018 14:41 ADDENDUM: I was asked to review the MR by the stroke neurologist Dr. Pearlean Brownie, with regard to the  question of lipoma posterior to the pituitary stalk which if present, could possibly preclude the patient's introduction into a clinical trial. 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 Electronically Signed   By: Elsie Stain M.D.   On: 03/16/2018 14:41   Result Date: 03/16/2018 CLINICAL DATA:  Initial evaluation for acute right facial and right lower extremity weakness. EXAM: MRI HEAD WITHOUT CONTRAST MRA HEAD WITHOUT CONTRAST TECHNIQUE: Multiplanar, multiecho pulse sequences of the brain and surrounding structures were obtained without intravenous contrast. Angiographic images of the head were obtained using MRA technique without contrast. COMPARISON:  Prior CT from earlier the same day as well as previous MRIs from 11/28/2015. FINDINGS: MRI HEAD FINDINGS Brain: Cerebral volume within normal limits for age. Mild chronic microvascular ischemic changes present within the periventricular white matter. Small remote/chronic lacunar infarcts seen involving the posterior right corona radiata. Associated small amount of chronic hemosiderin staining. Probable tiny remote left thalamic lacunar infarct noted as well. Abnormal restricted diffusion measuring 2.4 x 1.5 x 2.4 cm seen involving the left basal ganglia, extending from the left lentiform nucleus into  the caudate/corona radiata (series 3, image 34). No associated hemorrhage or mass effect. Finding corresponds with abnormality seen on prior CT. No other evidence for acute or subacute ischemia. Gray-white matter differentiation otherwise maintained. No evidence for acute intracranial hemorrhage. No mass lesion, midline shift or mass effect. No hydrocephalus. No extra-axial fluid collection. 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 Pituitary gland and sella otherwise unremarkable. Midline structures intact. Vascular: Major intracranial vascular flow voids maintained. Skull and upper cervical spine: And Landau occipital assimilation noted. Craniocervical junction otherwise unremarkable. No focal marrow replacing lesions. Scalp soft tissues unremarkable. Sinuses/Orbits: Globes orbital soft tissues within normal limits. Scattered mucosal thickening throughout the frontal, ethmoidal, and maxillary sinuses. Superimposed air-fluid level at the right frontoethmoidal recess, suggesting acute sinusitis. No mastoid effusion. Inner ear structures normal. Other: None. MRA HEAD FINDINGS ANTERIOR CIRCULATION: Distal cervical segments of the internal carotid arteries are patent with antegrade flow. Petrous segments patent bilaterally without stenosis. Multifocal atheromatous irregularity throughout the cavernous/supraclinoid ICAs with resultant mild to moderate stenoses (up to approximately 50%), worse on the left. No made of a small 2 mm focal outpouching extending from the anterior genu of the cavernous left ICA, which could reflect a small aneurysm (series 4, image 75). ICA termini patent bilaterally. Right A1 dominant and widely patent. Absent and/or hypoplastic left A1, accounting for the diminutive left ICA is compared to the right. Normal anterior communicating artery. Anterior cerebral arteries demonstrate scattered multifocal atheromatous irregularity but are patent to their distal aspects without flow-limiting stenosis. Left M1 widely patent proximally, subsequently bifurcates early. Short-segment moderate proximal left M2 stenosis, inferior division (series 453, image 9). Right M1 demonstrates atheromatous irregularity but is patent without high-grade stenosis. Normal right MCA bifurcation. Distal small vessel atheromatous irregularity noted within the MCA branches bilaterally. POSTERIOR CIRCULATION: Vertebral arteries  diminutive but widely patent to the vertebrobasilar junction without stenosis. Right vertebral artery slightly dominant. Posterior inferior cerebral arteries not seen on this exam. Atheromatous irregularity with mild narrowing involving the mid and distal diminutive basilar artery. Superior cerebral arteries patent bilaterally. Predominant fetal type origin of the PCAs bilaterally, although a small hypoplastic left P1 segment noted. PCAs demonstrate multifocal atheromatous irregularity without high-grade stenosis, right slightly worse than left. IMPRESSION: MRI HEAD IMPRESSION: 1. Acute ischemic nonhemorrhagic infarct involving the left basal ganglia/corona radiata. 2. Underlying mild chronic small vessel ischemic disease with small remote lacunar infarct involving the right posterior corona radiata. 3. Acute fronto ethmoidal sinusitis. MRA HEAD IMPRESSION: 1. Negative intracranial MRA for large vessel occlusion. 2. Mild-to-moderate atherosclerotic change involving both the anterior and posterior circulations without proximal high-grade or correctable stenosis, most notable about the carotid siphons. 3. Short-segment moderate proximal left M2 stenosis, similar to previous. 4. 2 mm focal outpouching arising from the cavernous left ICA as above, suspicious for a possible small aneurysm. Attention at follow-up recommended. 5. Hypoplastic/absent left A1 segment with associated diminutive left ICA as compared to the right. ACAs supplied via the right carotid artery system. 6. Predominant fetal type origin of the PCAs with secondary overall diminutive vertebrobasilar system. Electronically Signed: By: Rise Mu M.D. On: 03/15/2018 22:42   Mr Shirlee Latch (cerebral Arteries)  Addendum Date: 03/16/2018   ADDENDUM REPORT: 03/16/2018 14:41 ADDENDUM: I was asked to review the MR by the stroke neurologist Dr. Pearlean Brownie, with regard to the question of lipoma posterior to the pituitary stalk which if present, could  possibly preclude the patient's introduction into a clinical trial. 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 Electronically Signed   By: Elsie Stain M.D.   On: 03/16/2018 14:41   Result Date: 03/16/2018 CLINICAL DATA:  Initial evaluation for acute right facial and right lower extremity weakness. EXAM: MRI HEAD WITHOUT CONTRAST MRA HEAD WITHOUT CONTRAST TECHNIQUE: Multiplanar, multiecho pulse sequences of the brain and surrounding structures were obtained without intravenous contrast. Angiographic images of the head were obtained using MRA technique without contrast. COMPARISON:  Prior CT from earlier the same day as well as previous MRIs from 11/28/2015. FINDINGS: MRI HEAD FINDINGS Brain: Cerebral volume within normal limits for age. Mild chronic microvascular ischemic changes present within the periventricular white matter. Small remote/chronic lacunar infarcts seen involving the posterior right corona radiata. Associated small amount of chronic hemosiderin staining. Probable tiny remote left thalamic lacunar infarct noted as well. Abnormal restricted diffusion measuring 2.4 x 1.5 x 2.4 cm seen involving the left basal ganglia, extending from the left lentiform nucleus into the caudate/corona radiata (series 3, image 34). No associated hemorrhage or mass effect. Finding corresponds with abnormality seen on prior CT. No other evidence for acute or subacute ischemia. Gray-white matter differentiation otherwise maintained. No evidence for acute intracranial hemorrhage. No mass lesion, midline shift or mass effect. No hydrocephalus. No extra-axial fluid collection. 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 Pituitary gland and sella otherwise unremarkable.  Midline structures intact. Vascular: Major intracranial vascular flow voids maintained. Skull and upper cervical spine: And Landau occipital assimilation noted. Craniocervical junction otherwise unremarkable. No focal marrow replacing lesions. Scalp soft tissues unremarkable. Sinuses/Orbits: Globes orbital soft tissues within normal limits. Scattered mucosal thickening throughout the frontal, ethmoidal, and maxillary sinuses. Superimposed air-fluid level at the right frontoethmoidal recess, suggesting acute sinusitis. No mastoid effusion. Inner ear structures normal. Other: None. MRA HEAD FINDINGS ANTERIOR CIRCULATION: Distal cervical segments of the internal carotid arteries are patent with antegrade flow. Petrous segments patent bilaterally without stenosis. Multifocal atheromatous irregularity throughout the cavernous/supraclinoid ICAs with resultant mild to moderate stenoses (up to approximately 50%), worse on the left. No made of a small 2 mm focal outpouching extending from the anterior genu of the cavernous left ICA, which could reflect a small aneurysm (series 4, image 75). ICA termini patent bilaterally. Right A1 dominant and widely patent. Absent and/or hypoplastic left A1, accounting for the diminutive left ICA is compared to the right. Normal anterior communicating artery. Anterior cerebral arteries demonstrate scattered multifocal atheromatous irregularity but are patent to their distal aspects without flow-limiting stenosis. Left M1 widely patent proximally, subsequently bifurcates early. Short-segment moderate proximal left M2 stenosis, inferior division (series 453, image 9). Right M1 demonstrates atheromatous irregularity but is patent without high-grade stenosis. Normal right MCA bifurcation. Distal small vessel atheromatous irregularity noted within the MCA branches bilaterally. POSTERIOR CIRCULATION: Vertebral arteries diminutive but widely patent to the vertebrobasilar junction without stenosis.  Right vertebral artery slightly dominant. Posterior inferior cerebral arteries not seen on this exam. Atheromatous irregularity with mild narrowing involving the mid and distal diminutive basilar artery. Superior cerebral arteries patent bilaterally. Predominant fetal type origin of the PCAs bilaterally, although a small hypoplastic left P1 segment noted. PCAs demonstrate multifocal atheromatous irregularity without high-grade stenosis, right slightly worse than left. IMPRESSION: MRI HEAD IMPRESSION: 1. Acute ischemic nonhemorrhagic infarct involving  the left basal ganglia/corona radiata. 2. Underlying mild chronic small vessel ischemic disease with small remote lacunar infarct involving the right posterior corona radiata. 3. Acute fronto ethmoidal sinusitis. MRA HEAD IMPRESSION: 1. Negative intracranial MRA for large vessel occlusion. 2. Mild-to-moderate atherosclerotic change involving both the anterior and posterior circulations without proximal high-grade or correctable stenosis, most notable about the carotid siphons. 3. Short-segment moderate proximal left M2 stenosis, similar to previous. 4. 2 mm focal outpouching arising from the cavernous left ICA as above, suspicious for a possible small aneurysm. Attention at follow-up recommended. 5. Hypoplastic/absent left A1 segment with associated diminutive left ICA as compared to the right. ACAs supplied via the right carotid artery system. 6. Predominant fetal type origin of the PCAs with secondary overall diminutive vertebrobasilar system. Electronically Signed: By: Rise Mu M.D. On: 03/15/2018 22:42   Vas US Carotid (at [facility])  Result Date: 03/16/2018 Carotid Arterial Duplex Study Indications:       CVA, Speech disturbance and Facial weakness. Risk Factors:      Hypertension, hyperlipidemia, current smoker. Other Factors:     Medical noncompliance. Comparison Study:  Prior study from 11/29/15 is available for comparison. Performing  Technologist: Sherren Kerns RVS  Examination Guidelines: A complete evaluation includes B-mode imaging, spectral Doppler, color Doppler, and power Doppler as needed of all accessible portions of each vessel. Bilateral testing is considered an integral part of a complete examination. Limited examinations for reoccurring indications may be performed as noted.  Right Carotid Findings: +----------+--------+--------+--------+------------+------------------+           PSV cm/sEDV cm/sStenosisDescribe    Comments           +----------+--------+--------+--------+------------+------------------+ CCA Prox  104     24                          intimal thickening +----------+--------+--------+--------+------------+------------------+ CCA Distal99      30                          intimal thickening +----------+--------+--------+--------+------------+------------------+ ICA Prox  67      28              heterogenous                   +----------+--------+--------+--------+------------+------------------+ ICA Distal61      16                                             +----------+--------+--------+--------+------------+------------------+ ECA       64      16                                             +----------+--------+--------+--------+------------+------------------+ +----------+--------+-------+--------+-------------------+           PSV cm/sEDV cmsDescribeArm Pressure (mmHG) +----------+--------+-------+--------+-------------------+ Subclavian110                                        +----------+--------+-------+--------+-------------------+ +---------+--------+--+--------+--+ VertebralPSV cm/s45EDV cm/s14 +---------+--------+--+--------+--+  Left Carotid Findings: +----------+--------+--------+--------+------------+------------------+           PSV cm/sEDV cm/sStenosisDescribe  Comments            +----------+--------+--------+--------+------------+------------------+ CCA Prox  102     24                          intimal thickening +----------+--------+--------+--------+------------+------------------+ CCA Distal66      21                          intimal thickening +----------+--------+--------+--------+------------+------------------+ ICA Prox  61      27              heterogenous                   +----------+--------+--------+--------+------------+------------------+ ICA Distal113     43                                             +----------+--------+--------+--------+------------+------------------+ ECA       74      22                                             +----------+--------+--------+--------+------------+------------------+ +----------+--------+--------+--------+-------------------+ SubclavianPSV cm/sEDV cm/sDescribeArm Pressure (mmHG) +----------+--------+--------+--------+-------------------+           95                                          +----------+--------+--------+--------+-------------------+ +---------+--------+--+--------+--+ VertebralPSV cm/s54EDV cm/s18 +---------+--------+--+--------+--+  Summary: Right Carotid: Velocities in the right ICA are consistent with a 1-39% stenosis.                No significant change since study done 11/2015. Left Carotid: Velocities in the left ICA are consistent with a 1-39% stenosis.               No significant change since study done 11/2015. Vertebrals:  Bilateral vertebral arteries demonstrate antegrade flow. Subclavians: Normal flow hemodynamics were seen in bilateral subclavian              arteries. *See table(s) above for measurements and observations.  Electronically signed by Delia Heady MD on 03/16/2018 at 1:55:50 PM.    Final     Microbiology: No results found for this or any previous visit (from the past 240 hour(s)).   Labs: Basic Metabolic Panel: Recent Labs  Lab  03/15/18 2008 03/15/18 2016  NA 138 141  K 3.5 3.6  CL 107 107  CO2 23  --   GLUCOSE 88 92  BUN 15 17  CREATININE 1.30* 1.40*  CALCIUM 9.6  --    Liver Function Tests: Recent Labs  Lab 03/15/18 2008  AST 27  ALT 25  ALKPHOS 77  BILITOT 1.0  PROT 8.5*  ALBUMIN 4.3   No results for input(s): LIPASE, AMYLASE in the last 168 hours. No results for input(s): AMMONIA in the last 168 hours. CBC: Recent Labs  Lab 03/15/18 2008 03/15/18 2016  WBC 7.1  --   NEUTROABS 3.5  --   HGB 15.5 16.7  HCT 47.9 49.0  MCV 92.6  --   PLT 386  --    Cardiac Enzymes: Recent Labs  Lab 03/16/18 0513  CKTOTAL 212   BNP: BNP (last 3 results) No results for input(s): BNP in the last 8760 hours.  ProBNP (last 3 results) No results for input(s): PROBNP in the last 8760 hours.  CBG: Recent Labs  Lab 03/15/18 2002  GLUCAP 82       Signed:  Zannie Cove MD.  [facility] 03/17/2018, 5:40 PM

## 2018-03-17 NOTE — Care Management Note (Signed)
Case Management Note  Patient Details  Name: Eric Patterson MRN: [id number] Date of Birth: 05/04/1966  Subjective/Objective:                    Action/Plan: CM consulted for outpatient therapy. CM met with the patient and he would like to attend rehab at [facility]. Orders in Epic and information on the AVS.  Pt has supervision at home per his spouse and transportation home.  Expected Discharge Date:                  Expected Discharge Plan:  OP Rehab  In-House Referral:     Discharge planning Services  CM Consult  Post Acute Care Choice:    Choice offered to:     DME Arranged:    DME Agency:     HH Arranged:    HH Agency:     Status of Service:  Completed, signed off  If discussed at Long Length of Stay Meetings, dates discussed:    Additional Comments:  Kelli F Willard, RN 03/17/2018, 12:11 PM  

## 2018-03-17 NOTE — Progress Notes (Signed)
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 MRI on 10/30 showed acute ischemic nonhemorrhagic infarct involving the left basal ganglia.    PT Comments    Pt making steady progress with functional mobility. He tolerated gait training without use of an AD with min guard for safety. Pt also successfully completed stair training with min guard. Pt would continue to benefit from skilled physical therapy services at this time while admitted and after d/c to address the below listed limitations in order to improve overall safety and independence with functional mobility.    Follow Up Recommendations  Outpatient PT     Equipment Recommendations  None recommended by PT    Recommendations for Other Services       Precautions / Restrictions Precautions Precautions: Fall Restrictions Weight Bearing Restrictions: No    Mobility  Bed Mobility Overal bed mobility: Modified Independent                Transfers Overall transfer level: Needs assistance Equipment used: None Transfers: Sit to/from Stand Sit to Stand: Min guard         General transfer comment: min guard for safety, no instability with rise into standing from EOB; no AD needed  Ambulation/Gait Ambulation/Gait assistance: Min guard Gait Distance (Feet): 200 Feet Assistive device: None Gait Pattern/deviations: Decreased step length - right;Decreased step length - left;Step-through pattern;Decreased stride length Gait velocity: decreased   General Gait Details: pt with mild instability but no overt LOB or need for physical assistance, min guard for safety  without an AD   Stairs Stairs: Yes Stairs assistance: Min guard Stair Management: Two rails;Alternating pattern;Step to pattern;Forwards Number of Stairs: 2(x2 trials)     Wheelchair Mobility    Modified Rankin (Stroke Patients Only) Modified Rankin (Stroke Patients Only) Pre-Morbid Rankin Score: No symptoms Modified Rankin: Moderate disability     Balance Overall balance assessment: Needs assistance Sitting-balance support: Feet supported Sitting balance-Leahy Scale: Good     Standing balance support: During functional activity;No upper extremity supported Standing balance-Leahy Scale: Fair                              Cognition Arousal/Alertness: Awake/alert Behavior During Therapy: WFL for tasks assessed/performed Overall Cognitive Status: Within Functional Limits for tasks assessed                                        Exercises Other Exercises Other Exercises: bilateral LE coordination activities (including alternating LE ankle DF/PF, Knee flexion/extension and marching) Other Exercises: R UE target tapping task for motor coordination    General Comments        Pertinent Vitals/Pain Pain Assessment: No/denies pain    Home Living                      Prior Function            PT Goals (current goals can now be found in the care plan section) Acute Rehab PT Goals PT Goal Formulation: With patient Time  For Goal Achievement: 03/30/18 Potential to Achieve Goals: Good Progress towards PT goals: Progressing toward goals    Frequency    Min 4X/week      PT Plan Current plan remains appropriate    Co-evaluation              AM-PAC PT "6 Clicks" Daily Activity  Outcome Measure  Difficulty turning over in bed (including adjusting bedclothes, sheets and blankets)?: None Difficulty moving from lying on back to sitting on the side of the bed? : None Difficulty sitting down on and standing up from a chair  with arms (e.g., wheelchair, bedside commode, etc,.)?: A Little Help needed moving to and from a bed to chair (including a wheelchair)?: A Little Help needed walking in hospital room?: A Little Help needed climbing 3-5 steps with a railing? : A Lot 6 Click Score: 19    End of Session Equipment Utilized During Treatment: Gait belt Activity Tolerance: Patient tolerated treatment well Patient left: in bed;with call bell/phone within reach Nurse Communication: Mobility status PT Visit Diagnosis: Unsteadiness on feet (R26.81);Other abnormalities of gait and mobility (R26.89)     Time: 1027-1040 PT Time Calculation (min) (ACUTE ONLY): 13 min  Charges:  $Gait Training: 8-22 mins                     Deborah Chalk, [place], DPT  Acute Rehabilitation Services Pager [phone] Office [phone]     Alessandra Bevels Masayoshi Couzens 03/17/2018, 12:13 PM

## 2018-03-17 NOTE — Anesthesia Preprocedure Evaluation (Signed)
Anesthesia Evaluation  Patient identified by MRN, date of birth, ID band Patient awake    Reviewed: Allergy & Precautions, NPO status , Patient's Chart, lab work & pertinent test results  Airway Mallampati: III  TM Distance: >3 FB Neck ROM: Full    Dental no notable dental hx. (+) Teeth Intact   Pulmonary Current Smoker,    Pulmonary exam normal breath sounds clear to auscultation       Cardiovascular hypertension, Pt. on medications Normal cardiovascular exam Rhythm:Regular Rate:Normal     Neuro/Psych CVA 03/15/18 with residual RLE weakness CVA, Residual Symptoms negative psych ROS   GI/Hepatic negative GI ROS, (+)     substance abuse  alcohol use,   Endo/Other  negative endocrine ROS  Renal/GU negative Renal ROS     Musculoskeletal negative musculoskeletal ROS (+)   Abdominal   Peds  Hematology negative hematology ROS (+)   Anesthesia Other Findings   Reproductive/Obstetrics                            Anesthesia Physical Anesthesia Plan  ASA: III  Anesthesia Plan: MAC   Post-op Pain Management:    Induction: Intravenous  PONV Risk Score and Plan: Treatment may vary due to age or medical condition and Propofol infusion  Airway Management Planned: Natural Airway and Nasal Cannula  Additional Equipment:   Intra-op Plan:   Post-operative Plan:   Informed Consent: I have reviewed the patients History and Physical, chart, labs and discussed the procedure including the risks, benefits and alternatives for the proposed anesthesia with the patient or authorized representative who has indicated his/her understanding and acceptance.   Dental advisory given  Plan Discussed with: CRNA  Anesthesia Plan Comments:         Anesthesia Quick Evaluation

## 2018-03-17 NOTE — Progress Notes (Signed)
Patient arrived back to the floor from TEE alert and Oriented X 4 Denies pain Nurse will continue to monitor

## 2018-03-17 NOTE — Anesthesia Postprocedure Evaluation (Signed)
Anesthesia Post Note  Patient: Montague Hunley  Procedure(s) Performed: TRANSESOPHAGEAL ECHOCARDIOGRAM (TEE) (N/A ) BUBBLE STUDY     Patient location during evaluation: PACU Anesthesia Type: MAC Level of consciousness: awake and alert Pain management: pain level controlled Vital Signs Assessment: post-procedure vital signs reviewed and stable Respiratory status: spontaneous breathing, nonlabored ventilation and respiratory function stable Cardiovascular status: blood pressure returned to baseline and stable Postop Assessment: no apparent nausea or vomiting Anesthetic complications: no    Last Vitals:  Vitals:   03/17/18 1350 03/17/18 1400  BP: (!) 205/83 (!) 182/114  Pulse: (!) 46 (!) 44  Resp: 18 17  Temp:    SpO2: 100% 100%    Last Pain:  Vitals:   03/17/18 1350  TempSrc:   PainSc: 0-No pain                 Brennan Bailey

## 2018-03-17 NOTE — Progress Notes (Signed)
Patient discharged home. Discharged paperwork went over with patient, all questions and concerns answered. All IVs taken out. Taken down in wheelchair. Patient's wife to transport home.

## 2018-03-17 NOTE — Consult Note (Addendum)
ELECTROPHYSIOLOGY CONSULT NOTE  Patient ID: Eric Patterson MRN: [id number], DOB/AGE: 51/25/1967   Admit date: 03/15/2018 Date of Consult: 03/17/2018  Primary Physician: Veryl Speak, FNP Primary Cardiologist: none Reason for Consultation: Cryptogenic stroke ; recommendations regarding Implantable Loop Recorder, requested by Dr. Pearlean Brownie  History of Present Illness Eric Patterson was admitted on 03/15/2018 with facial weakness and slurred speech, dx with stroke.  PMHx noted for prior stroke, HTN, HLD, smoker, and ETOH use.  The patient developed symptoms while at work.  Imaging demonstrated Large left basal ganglia/corona radiata infarct embolic secondary to unknown source.  he has undergone workup for stroke including echocardiogram and carotid dopplers.  The patient has been monitored on telemetry which has demonstrated sinus rhythm with no arrhythmias.  Inpatient stroke work-up is to be completed with a TEE.   Echocardiogram this admission demonstrated   Study Conclusions - Left ventricle: The cavity size was normal. Systolic function was   normal. The estimated ejection fraction was in the range of 60%   to 65%. Wall motion was normal; there were no regional wall   motion abnormalities. Left ventricular diastolic function   parameters were normal. - Atrial septum: No defect or patent foramen ovale was identified.   Lab work is reviewed.  Prior to admission, the patient denies chest pain, shortness of breath, dizziness, palpitations, or syncope.  They are recovering from their stroke with plans to home at discharge.    Past Medical History:  Diagnosis Date  . Hyperlipidemia   . Hypertension   . Stroke [facility]) 2017   R side weakness deficits     Surgical History: History reviewed. No pertinent surgical history.   Medications Prior to Admission  Medication Sig Dispense Refill Last Dose  . aspirin 81 MG EC tablet Take 1 tablet (81 mg total) by mouth daily. 30 tablet 2 Past Week  at Unknown time  . telmisartan-hydrochlorothiazide (MICARDIS HCT) 80-25 MG tablet Take 1 tablet by mouth daily. 30 tablet 0 Past Week at Unknown time  . cephALEXin (KEFLEX) 500 MG capsule Take 1 capsule (500 mg total) by mouth 2 (two) times daily. (Patient not taking: Reported on 03/15/2018) 10 capsule 0 Completed Course at Unknown time  . EPINEPHrine 0.3 mg/0.3 mL IJ SOAJ injection Inject 0.3 mLs (0.3 mg total) into the muscle once. (Patient not taking: Reported on 03/15/2018) 1 Device 1 Not Taking at Unknown time  . trimethoprim-polymyxin b (POLYTRIM) ophthalmic solution Place 1 drop into the right eye every 4 (four) hours. (Patient not taking: Reported on 03/15/2018) 10 mL 0 Completed Course at Unknown time    Inpatient Medications:  . aspirin EC  81 mg Oral Daily  . atorvastatin  40 mg Oral q1800  . clopidogrel  75 mg Oral Daily  . enoxaparin (LOVENOX) injection  40 mg Subcutaneous Q24H    Allergies: No Known Allergies  Social History   Socioeconomic History  . Marital status: Married    Spouse name: Not on file  . Number of children: 1  . Years of education: 103  . Highest education level: Not on file  Occupational History  . Occupation: Economist  Social Needs  . Financial resource strain: Not on file  . Food insecurity:    Worry: Not on file    Inability: Not on file  . Transportation needs:    Medical: Not on file    Non-medical: Not on file  Tobacco Use  . Smoking status: Current Every Day Smoker  Packs/day: 1.50    Types: Cigarettes    Last attempt to quit: 11/28/2015    Years since quitting: 2.3  . Smokeless tobacco: Never Used  Substance and Sexual Activity  . Alcohol use: Yes    Comment: Occasional  . Drug use: No  . Sexual activity: Not on file  Lifestyle  . Physical activity:    Days per week: Not on file    Minutes per session: Not on file  . Stress: Not on file  Relationships  . Social connections:    Talks on phone: Not on file    Gets  together: Not on file    Attends religious service: Not on file    Active member of club or organization: Not on file    Attends meetings of clubs or organizations: Not on file    Relationship status: Not on file  . Intimate partner violence:    Fear of current or ex partner: Not on file    Emotionally abused: Not on file    Physically abused: Not on file    Forced sexual activity: Not on file  Other Topics Concern  . Not on file  Social History Narrative   Fun/Hobby: Work on his truck      Family History  Problem Relation Age of Onset  . Heart attack Mother   . Stroke Brother 76  . Stroke Other       Review of Systems: All other systems reviewed and are otherwise negative except as noted above.  Physical Exam: Vitals:   03/16/18 1746 03/16/18 2003 03/16/18 2320 03/17/18 0434  BP: (!) 180/101 (!) 191/98 (!) 164/98 (!) 164/93  Pulse: (!) 53 (!) 46 (!) 46 (!) 46  Resp: 20     Temp: 98.3 F (36.8 C) 98.2 F (36.8 C) 97.6 F (36.4 C) 97.7 F (36.5 C)  TempSrc: Oral Oral Oral Oral  SpO2: 100% 100% 100% 99%  Weight:      Height:        GEN- The patient is well appearing, alert and oriented x 3 today.   Head- normocephalic, atraumatic Eyes-  Sclera clear, conjunctiva pink Ears- hearing intact Oropharynx- clear Neck- supple Lungs- CTA b/l normal work of breathing Heart- RRR, no murmurs, rubs or gallops  GI- soft, NT, ND Extremities- no clubbing, cyanosis, or edema MS- no significant deformity or atrophy Skin- no rash or lesion Psych- euthymic mood, full affect   Labs:   Lab Results  Component Value Date   WBC 7.1 03/15/2018   HGB 16.7 03/15/2018   HCT 49.0 03/15/2018   MCV 92.6 03/15/2018   PLT 386 03/15/2018    Recent Labs  Lab 03/15/18 2008 03/15/18 2016  NA 138 141  K 3.5 3.6  CL 107 107  CO2 23  --   BUN 15 17  CREATININE 1.30* 1.40*  CALCIUM 9.6  --   PROT 8.5*  --   BILITOT 1.0  --   ALKPHOS 77  --   ALT 25  --   AST 27  --   GLUCOSE  88 92   Lab Results  Component Value Date   CKTOTAL 212 03/16/2018   Lab Results  Component Value Date   CHOL 150 03/16/2018   CHOL 161 11/29/2015   Lab Results  Component Value Date   HDL 38 (L) 03/16/2018   HDL 34 (L) 11/29/2015   Lab Results  Component Value Date   LDLCALC 101 (H) 03/16/2018   LDLCALC 95  11/29/2015   Lab Results  Component Value Date   TRIG 54 03/16/2018   TRIG 160 (H) 11/29/2015   Lab Results  Component Value Date   CHOLHDL 3.9 03/16/2018   CHOLHDL 4.7 11/29/2015   No results found for: LDLDIRECT  No results found for: DDIMER   Radiology/Studies:   Ct Head Wo Contrast Result Date: 03/15/2018 CLINICAL DATA:  Right-sided facial droop EXAM: CT HEAD WITHOUT CONTRAST TECHNIQUE: Contiguous axial images were obtained from the base of the skull through the vertex without intravenous contrast. COMPARISON:  11/29/2015 FINDINGS: Brain: New area of hypodensity, age indeterminate but likely recent involving the left basal ganglia and corona radiata suspicious for a nonhemorrhagic infarct. Smaller hypodensities in the left basal ganglia and along the right caudate body have the appearance of more remote infarcts. No acute intracranial hemorrhage, midline shift or edema. No large vascular territory infarction. No hydrocephalus, intra or extra-axial mass or extra-axial fluid collections. Midline fourth ventricle basal cisterns. Vascular: No hyperdense vessel sign. Skull: No acute skull fracture. Sinuses/Orbits: No acute finding. Other: None. IMPRESSION: Age-indeterminate but more likely a subacute to acute nonhemorrhagic infarct involving left basal ganglia and corona radiata. More remote appearing small bilateral basal ganglial and right caudate body lacunar infarcts. Electronically Signed   By: Tollie Eth M.D.   On: 03/15/2018 21:08    Mr Brain Wo Contrast (neuro Protocol) Addendum Date: 03/16/2018   ADDENDUM REPORT: 03/16/2018 14:41 ADDENDUM: I was asked to review  the MR by the stroke neurologist Dr. 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 Electronically Signed   By: Elsie Stain M.D.   On: 03/16/2018 14:41  Result Date: 03/16/2018 CLINICAL DATA:  Initial evaluation for acute right facial and right lower extremity weakness. EXAM: MRI HEAD WITHOUT CONTRAST MRA HEAD WITHOUT CONTRAST TECHNIQUE: Multiplanar, multiecho pulse sequences of the brain and surrounding structures were obtained without intravenous contrast. Angiographic images of the head were obtained using MRA technique without contrast. COMPARISON:  Prior CT from earlier the same day as well as previous MRIs from 11/28/2015. FINDINGS: MRI HEAD FINDINGS Brain: Cerebral volume within normal limits for age. Mild chronic microvascular ischemic changes present within the periventricular white matter. Small remote/chronic lacunar infarcts seen involving the posterior right corona radiata. Associated small amount of chronic hemosiderin staining. Probable tiny remote left thalamic lacunar infarct noted as well. Abnormal restricted diffusion measuring 2.4 x 1.5 x 2.4 cm seen involving the left basal ganglia, extending from the left lentiform nucleus into the caudate/corona radiata (series 3, image 34). No associated hemorrhage or mass effect. Finding corresponds with abnormality seen on prior CT. No other evidence for acute or subacute ischemia. Gray-white matter differentiation otherwise maintained. No evidence for acute intracranial hemorrhage. No mass lesion, midline shift or mass effect. No hydrocephalus. No extra-axial fluid  collection. 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 Pituitary gland and sella otherwise unremarkable. Midline structures intact. Vascular: Major intracranial vascular flow voids maintained. Skull and upper cervical spine: And Landau occipital assimilation noted. Craniocervical junction otherwise unremarkable. No focal marrow replacing lesions. Scalp soft tissues unremarkable. Sinuses/Orbits: Globes orbital soft  tissues within normal limits. Scattered mucosal thickening throughout the frontal, ethmoidal, and maxillary sinuses. Superimposed air-fluid level at the right frontoethmoidal recess, suggesting acute sinusitis. No mastoid effusion. Inner ear structures normal. Other: None. MRA HEAD FINDINGS ANTERIOR CIRCULATION: Distal cervical segments of the internal carotid arteries are patent with antegrade flow. Petrous segments patent bilaterally without stenosis. Multifocal atheromatous irregularity throughout the cavernous/supraclinoid ICAs with resultant mild to moderate stenoses (up to approximately 50%), worse on the left. No made of a small 2 mm focal outpouching extending from the anterior genu of the cavernous left ICA, which could reflect a small aneurysm (series 4, image 75). ICA termini patent bilaterally. Right A1 dominant and widely patent. Absent and/or hypoplastic left A1, accounting for the diminutive left ICA is compared to the right. Normal anterior communicating artery. Anterior cerebral arteries demonstrate scattered multifocal atheromatous irregularity but are patent to their distal aspects without flow-limiting stenosis. Left M1 widely patent proximally, subsequently bifurcates early. Short-segment moderate proximal left M2 stenosis, inferior division (series 453, image 9). Right M1 demonstrates atheromatous irregularity but is patent without high-grade stenosis. Normal right MCA bifurcation. Distal small vessel atheromatous irregularity noted within the MCA  branches bilaterally. POSTERIOR CIRCULATION: Vertebral arteries diminutive but widely patent to the vertebrobasilar junction without stenosis. Right vertebral artery slightly dominant. Posterior inferior cerebral arteries not seen on this exam. Atheromatous irregularity with mild narrowing involving the mid and distal diminutive basilar artery. Superior cerebral arteries patent bilaterally. Predominant fetal type origin of the PCAs bilaterally, although a small hypoplastic left P1 segment noted. PCAs demonstrate multifocal atheromatous irregularity without high-grade stenosis, right slightly worse than left. IMPRESSION: MRI HEAD IMPRESSION: 1. Acute ischemic nonhemorrhagic infarct involving the left basal ganglia/corona radiata. 2. Underlying mild chronic small vessel ischemic disease with small remote lacunar infarct involving the right posterior corona radiata. 3. Acute fronto ethmoidal sinusitis. MRA HEAD IMPRESSION: 1. Negative intracranial MRA for large vessel occlusion. 2. Mild-to-moderate atherosclerotic change involving both the anterior and posterior circulations without proximal high-grade or correctable stenosis, most notable about the carotid siphons. 3. Short-segment moderate proximal left M2 stenosis, similar to previous. 4. 2 mm focal outpouching arising from the cavernous left ICA as above, suspicious for a possible small aneurysm. Attention at follow-up recommended. 5. Hypoplastic/absent left A1 segment with associated diminutive left ICA as compared to the right. ACAs supplied via the right carotid artery system. 6. Predominant fetal type origin of the PCAs with secondary overall diminutive vertebrobasilar system. Electronically Signed: By: Rise Mu M.D. On: 03/15/2018 22:42     Vas US Carotid (at [facility] Only) Result Date: 03/16/2018 Carotid Arterial Duplex Study Indications:       CVA, Speech disturbance and Facial weakness. Risk Factors:      Hypertension, hyperlipidemia,  current smoker. Other Factors:     Medical noncompliance. Comparison Study:  Prior study from 11/29/15 is available for comparison. Performing Technologist: Sherren Kerns RVS  Examination Guidelines: A complete evaluation includes B-mode imaging, spectral Doppler, color Doppler, and power Doppler as needed of all accessible portions of each vessel. Bilateral testing is considered an integral part of a complete examination. Limited examinations for reoccurring indications may be performed as noted.  Right Carotid Findings:Summary: Right Carotid: Velocities in the right ICA are consistent with a 1-39% stenosis.                No significant change since study done 11/2015. Left Carotid: Velocities in the left ICA are consistent with a 1-39% stenosis.  No significant change since study done 11/2015. Vertebrals:  Bilateral vertebral arteries demonstrate antegrade flow. Subclavians: Normal flow hemodynamics were seen in bilateral subclavian              arteries. *See table(s) above for measurements and observations.  Electronically signed by Delia Heady MD on 03/16/2018 at 1:55:50 PM.    Final     12-lead ECG SR All prior EKG's in EPIC reviewed with no documented atrial fibrillation  Telemetry SR  Assessment and Plan:  1. Cryptogenic stroke The patient presents with cryptogenic stroke.  The patient has a TEE planned for this AM.  Dr. Elberta Fortis has seen and examined the patient, spoke at length with the patient about monitoring for afib with either a 30 day event monitor or an implantable loop recorder.  Risks, benefits, and alteratives to implantable loop recorder were discussed with the patient today.   At this time, the patient is very clear in his decision to proceed with implantable loop recorder if needed post TEE.   Wound care was reviewed with the patient (keep incision clean and dry for 3 days).  Wound check Ashlyn Cabler be scheduled for the patient  Please call with questions.   Sheilah Pigeon, PA-C 03/17/2018  I have seen and examined this patient with Francis Dowse.  Agree with above, note added to reflect my findings.  On exam, RRR, no murmurs, lungs clear.  Admitted to the hospital for cryptogenic stroke.  Plan for TEE this morning.  I discussed with the patient further options of monitoring for atrial fibrillation including Linq monitor.  Risks include bleeding and infection.  The patient understands these risks and is agreed to the procedure.  Yoltzin Ransom M. Eunique Balik MD 03/17/2018 10:56 AM

## 2018-03-17 NOTE — Progress Notes (Signed)
Patient discharged home with personal belongings, after discharge instructions given to him, stated he understood them, no questions asked by the patient.

## 2018-03-20 ENCOUNTER — Encounter (HOSPITAL_COMMUNITY): Payer: Self-pay | Admitting: Cardiology

## 2018-03-28 IMAGING — MR MR HEAD W/O CM
9 of 10 series · 36 of 48 positions shown · non-contrast
Comparison: None.

CLINICAL DATA: Left leg weakness.  Stroke.

EXAM:
MRI HEAD WITHOUT CONTRAST
TECHNIQUE: Multiplanar, multiecho pulse sequences of the brain and surrounding
structures were obtained without intravenous contrast.

[Series 3: DWI · axial · 3.0mm · 1.09mm/px · z∈[-49,+94]mm · 9 of 98 slices shown (1 of 4)]
[im 1/98]
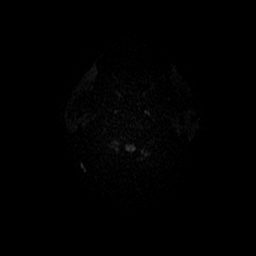
[im 13/98]
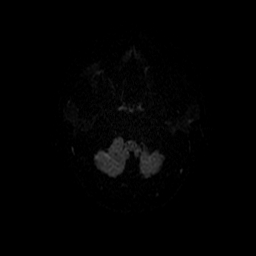
[im 25/98]
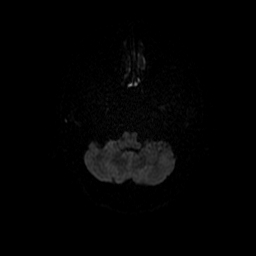
[im 37/98]
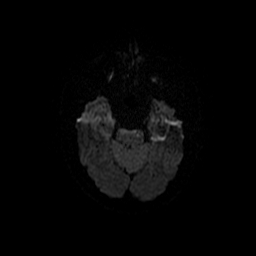
[im 49/98]
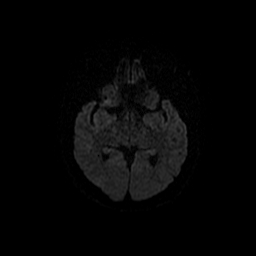
[im 61/98]
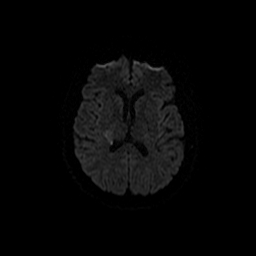
[im 73/98]
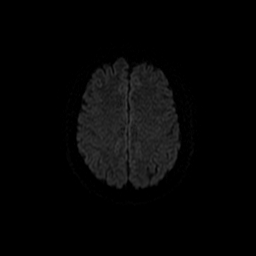
[im 85/98]
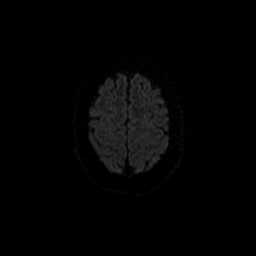
[im 98/98]
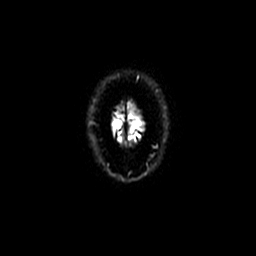

[Series 4: T1 · sagittal · 5.0mm · 0.47mm/px · 2 of 23 slices shown]
[im 1/23]
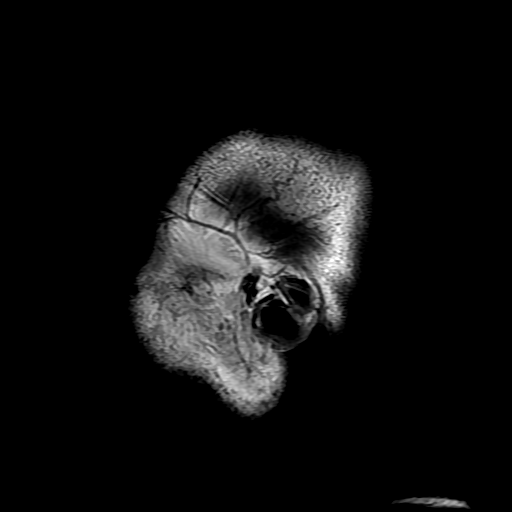
[im 23/23]
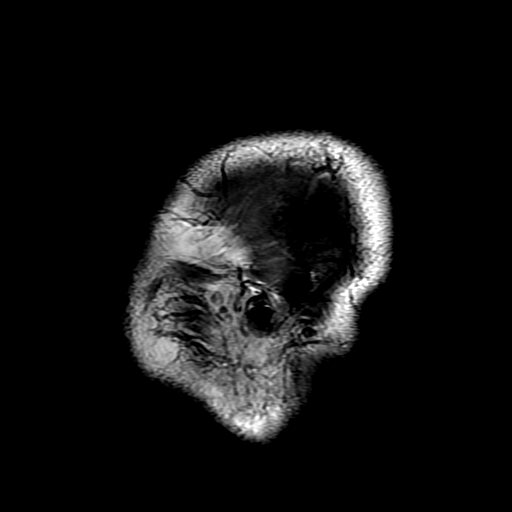

[Series 5: T2 · axial · 5.0mm · 0.43mm/px · z∈[-47,+96]mm · 3 of 25 slices shown (1 of 2)]
[im 1/25]
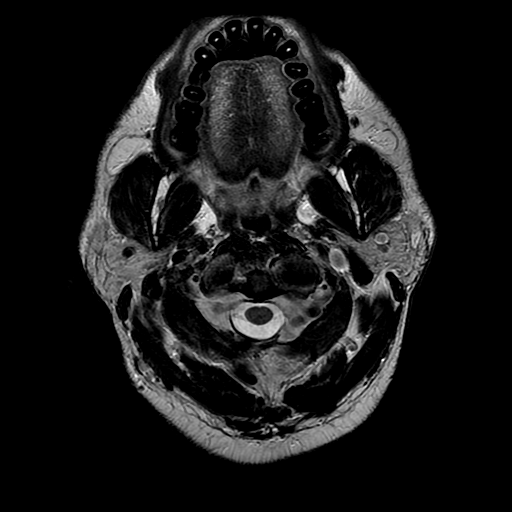
[im 13/25]
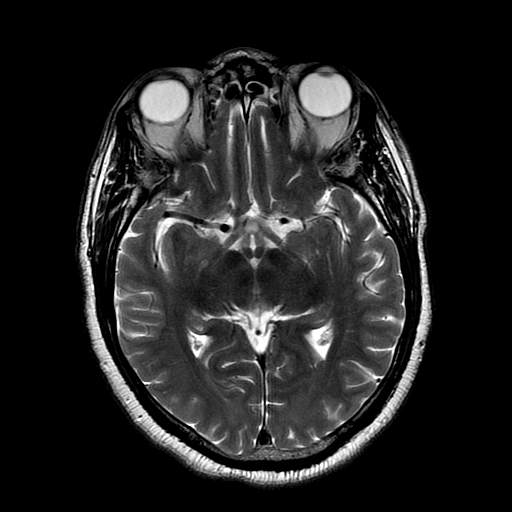
[im 25/25]
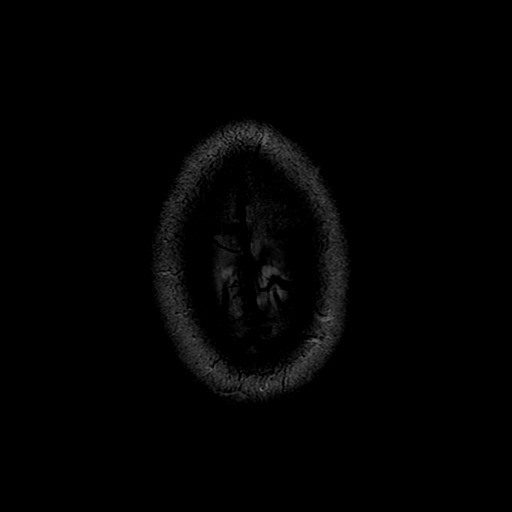

[Series 6: DWI · coronal · 5.0mm · 1.09mm/px · 7 of 64 slices shown (2 of 4)]
[im 1/64]
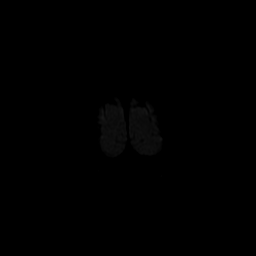
[im 11/64]
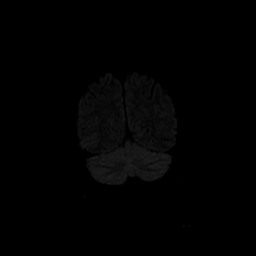
[im 22/64]
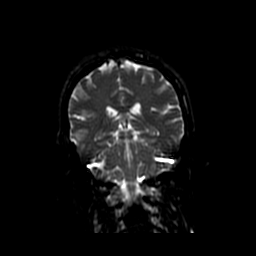
[im 32/64]
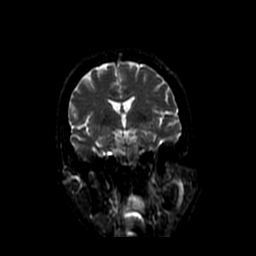
[im 43/64]
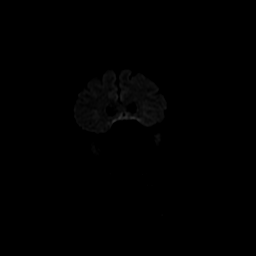
[im 53/64]
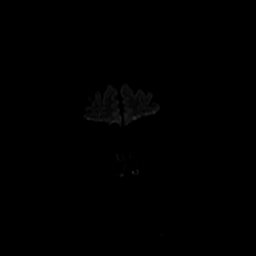
[im 64/64]
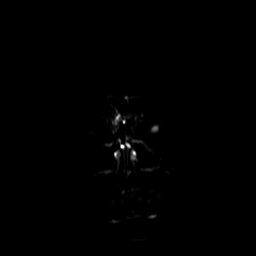

[Series 7: FLAIR · axial · 5.0mm · 0.43mm/px · z∈[-47,+96]mm · 3 of 25 slices shown]
[im 1/25]
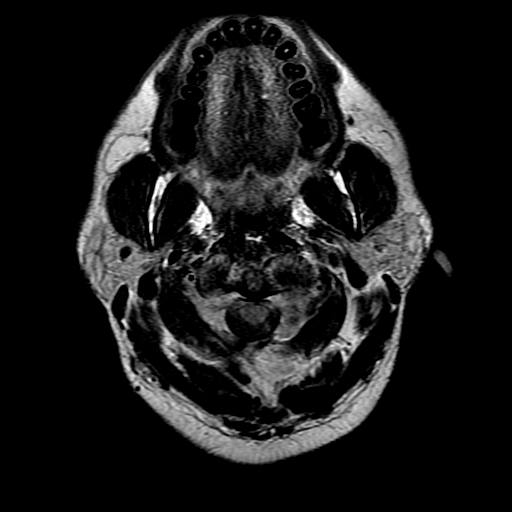
[im 13/25]
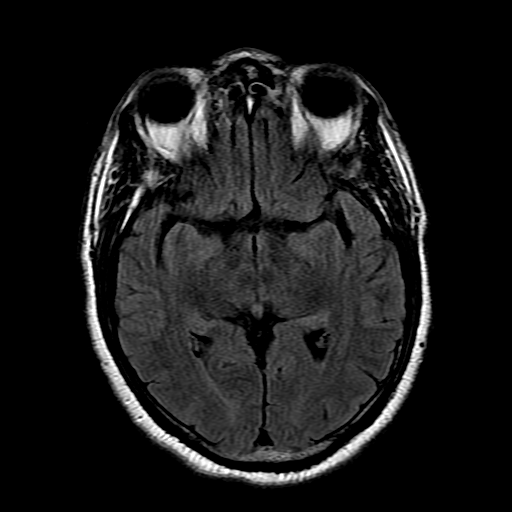
[im 25/25]
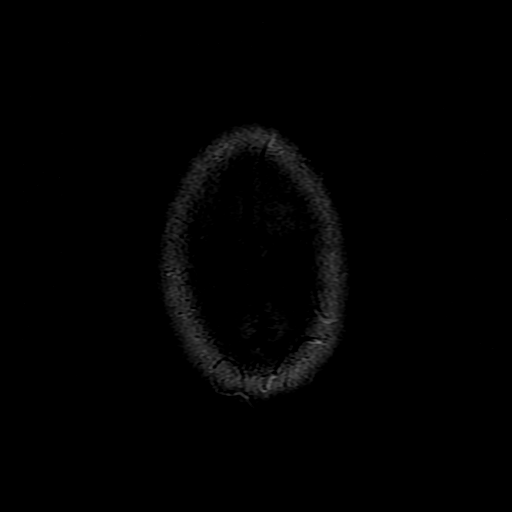

[Series 8: ax mpgr · axial · 5.0mm · 0.43mm/px · 1 of 25 slices shown]
[im 1/25]
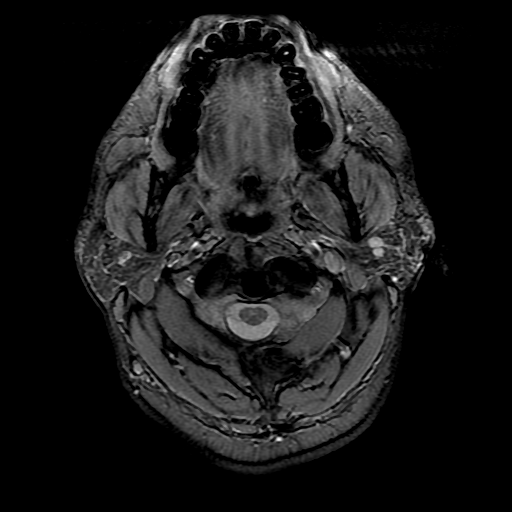

[Series 10: T2 · coronal · 5.0mm · 0.39mm/px · 3 of 25 slices shown (2 of 2)]
[im 1/25]
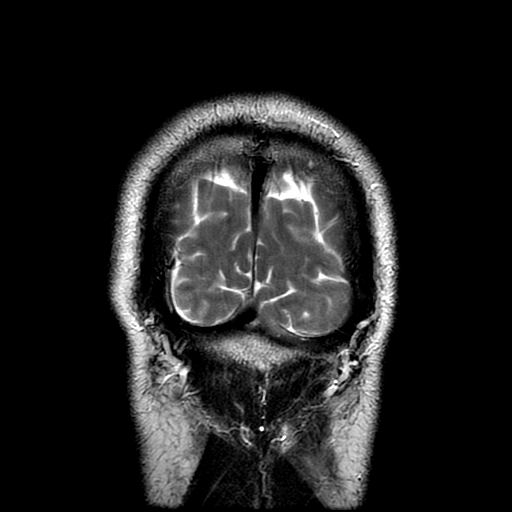
[im 13/25]
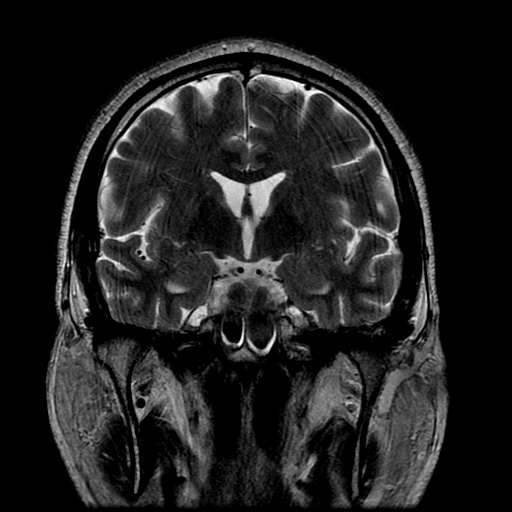
[im 25/25]
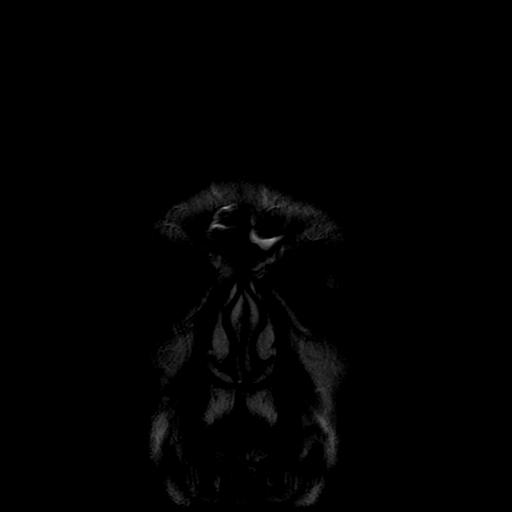

[Series 300: DWI · axial · 3.0mm · 1.09mm/px · z∈[-49,+94]mm · 5 of 49 slices shown (3 of 4)]
[im 1/49]
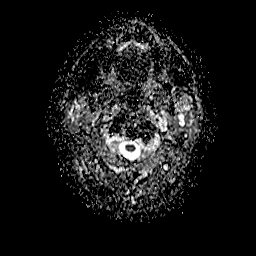
[im 13/49]
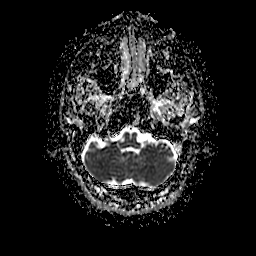
[im 25/49]
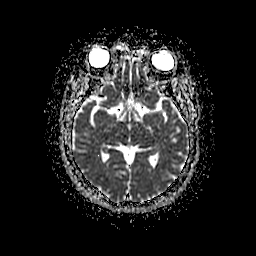
[im 37/49]
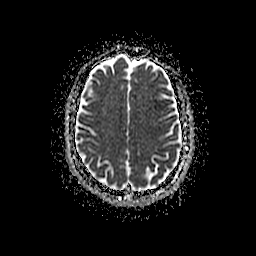
[im 49/49]
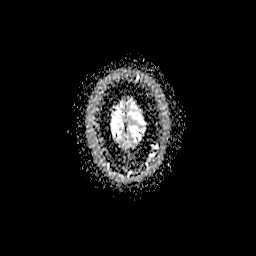

[Series 600: DWI · coronal · 5.0mm · 1.09mm/px · 3 of 32 slices shown (4 of 4)]
[im 1/32]
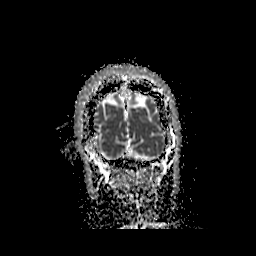
[im 16/32]
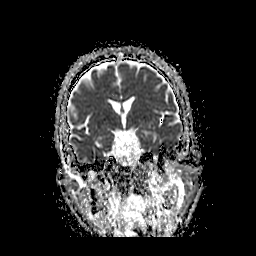
[im 32/32]
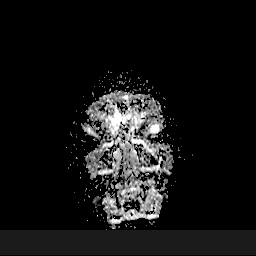

[36 of 48 positions shown; findings below may reference images not displayed]

FINDINGS: Acute infarct in the deep white matter posteriorly on the right.
This involves the corona radiata and external capsule posteriorly.
No other areas of acute infarct.

Minimal chronic microvascular ischemic changes in the cerebral white
matter. Brainstem normal.

Ventricle size normal.  Cerebral volume normal.

Negative for intracranial hemorrhage.  No fluid collection.

Negative for mass or edema.  No shift of the midline structures.

Mucosal edema in the paranasal sinuses.

Pituitary normal. Calvarium and skull base normal. Orbital contents
normal.
IMPRESSION: Acute infarct in the right posterior corona radiata and posterior
external capsule. Minimal chronic microvascular ischemic change in
the white matter. Otherwise negative

Mucosal edema paranasal sinuses.

## 2018-03-28 IMAGING — MR MR MRA HEAD W/O CM
1 series · 19 of 48 positions shown · non-contrast
Comparison: MRI brain without contrast from the same day.

CLINICAL DATA: Acute infarcts involving the posterior limb internal
capsule and centrum semi ovale.

EXAM:
MRA HEAD WITHOUT CONTRAST
TECHNIQUE: Angiographic images of the Circle of Willis were obtained using MRA
technique without intravenous contrast.

[Series 3: (id) mt fs · axial · 1.2mm · 0.43mm/px · z∈[-70,+33]mm · 19 of 182 slices shown]
[im 1/182]
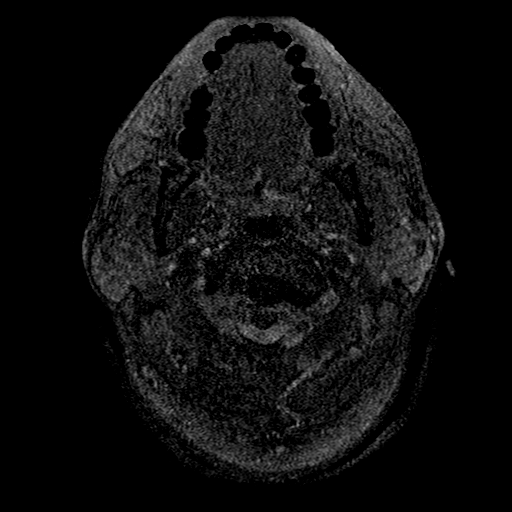
[im 4/182]
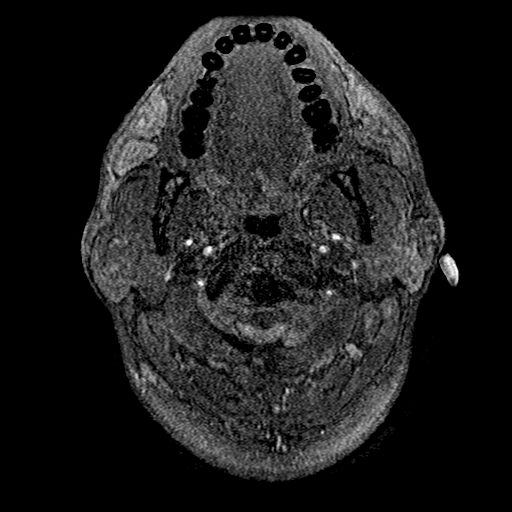
[im 8/182]
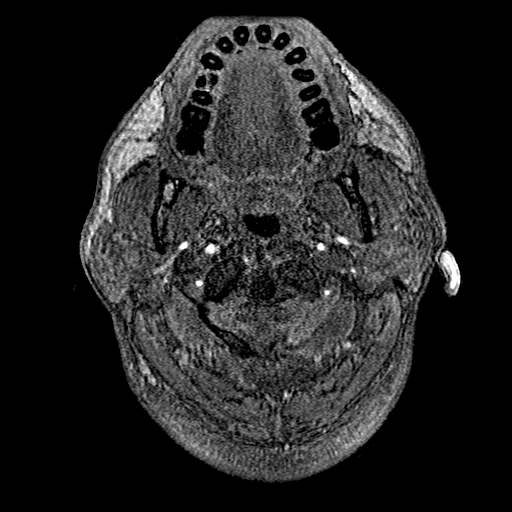
[im 12/182]
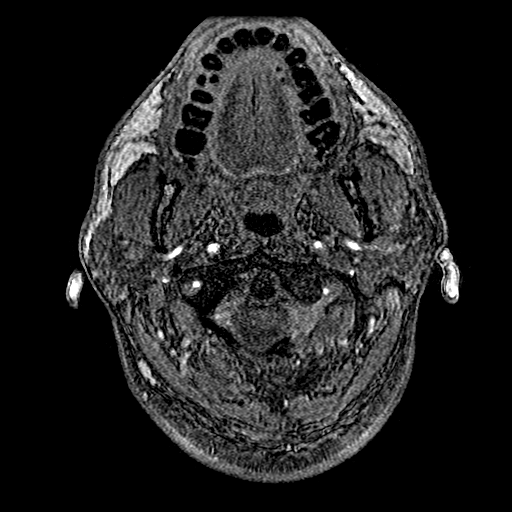
[im 16/182]
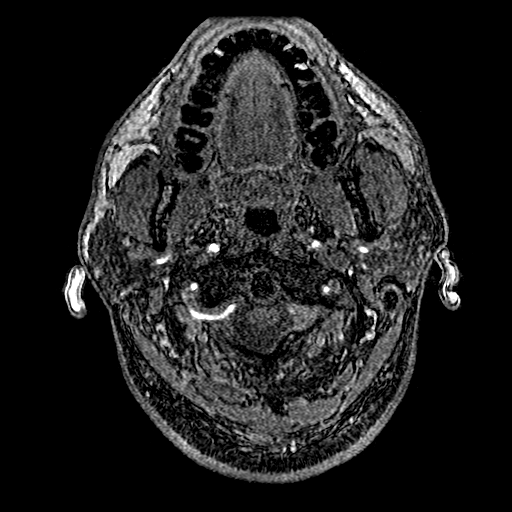
[im 20/182]
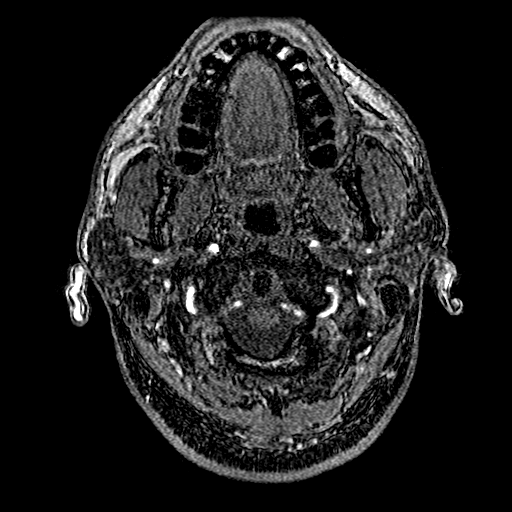
[im 24/182]
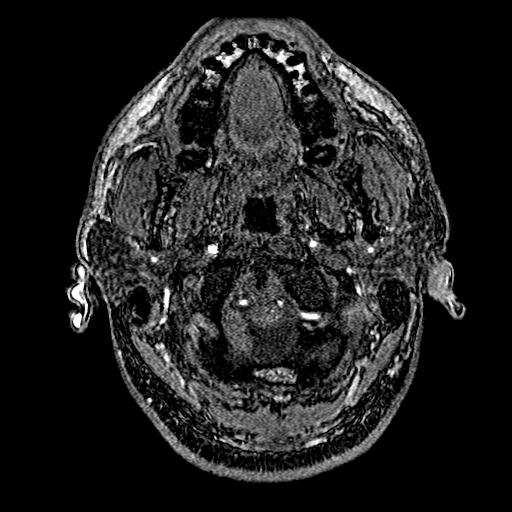
[im 27/182]
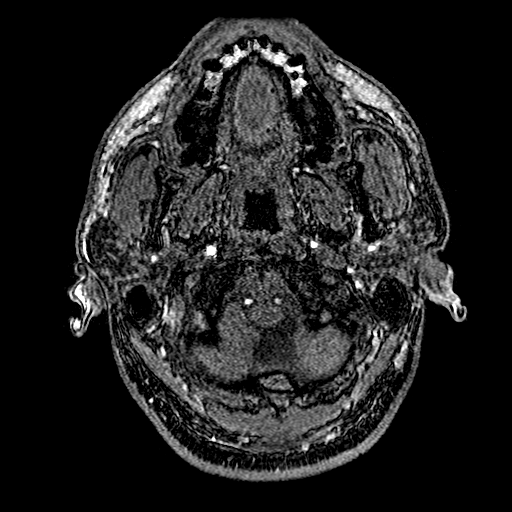
[im 31/182]
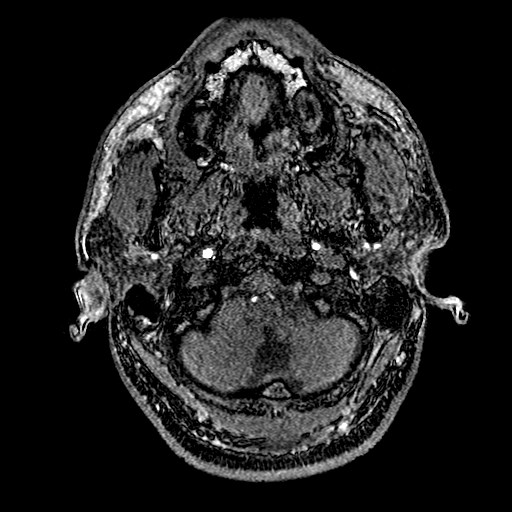
[im 35/182]
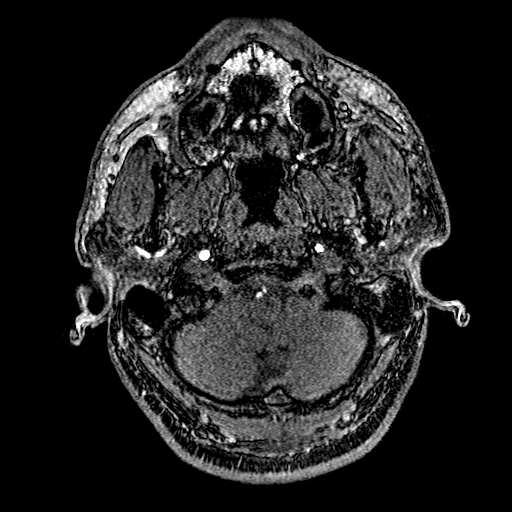
[im 39/182]
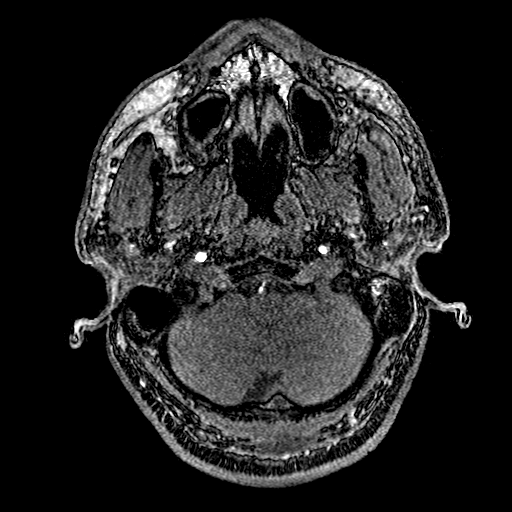
[im 58/182]
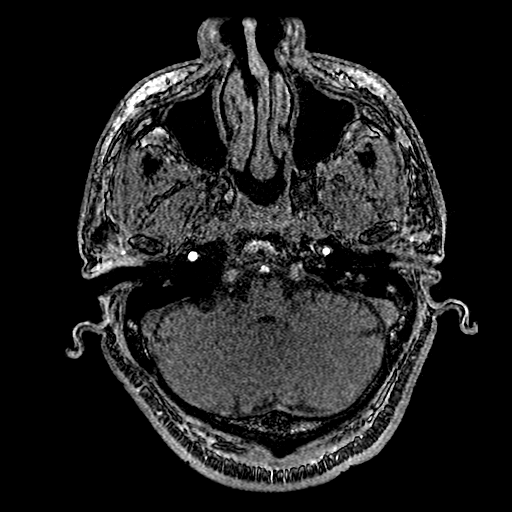
[im 81/182]
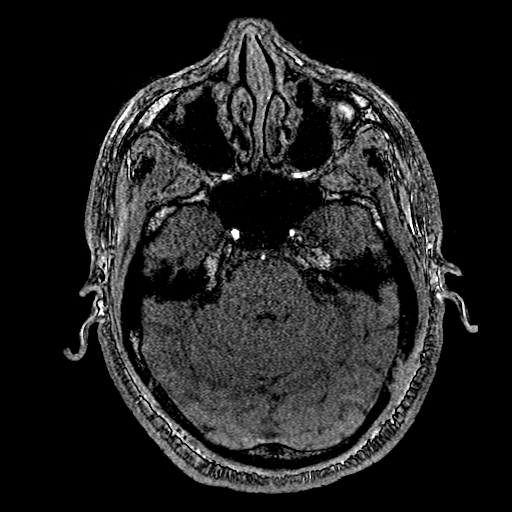
[im 93/182]
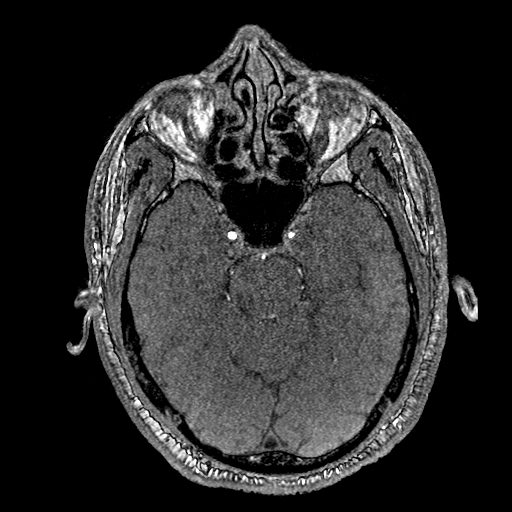
[im 104/182]
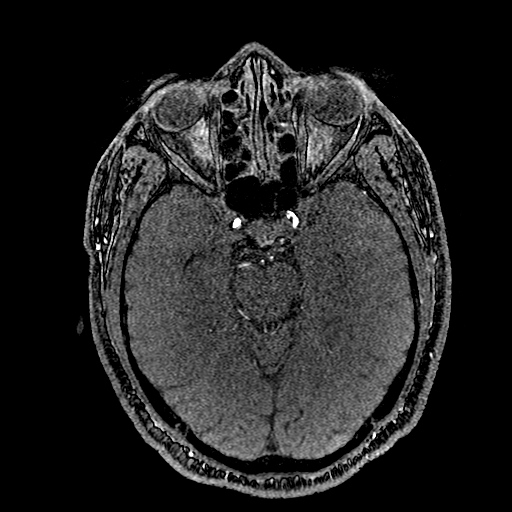
[im 128/182]
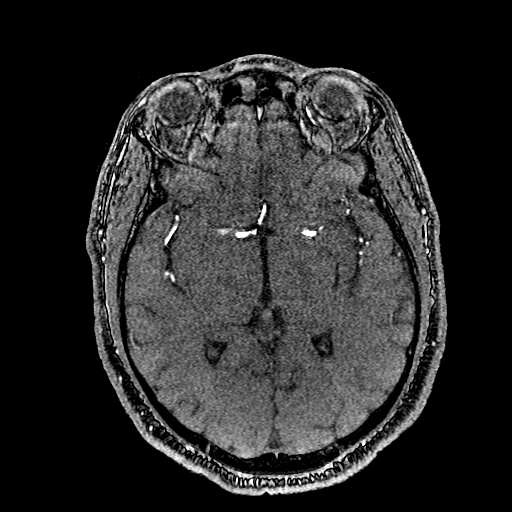
[im 151/182]
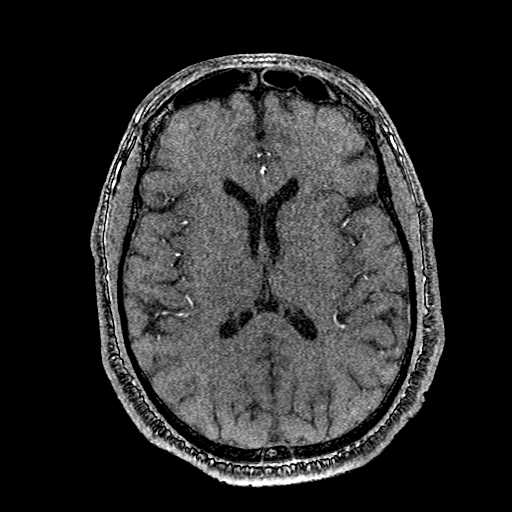
[im 155/182]
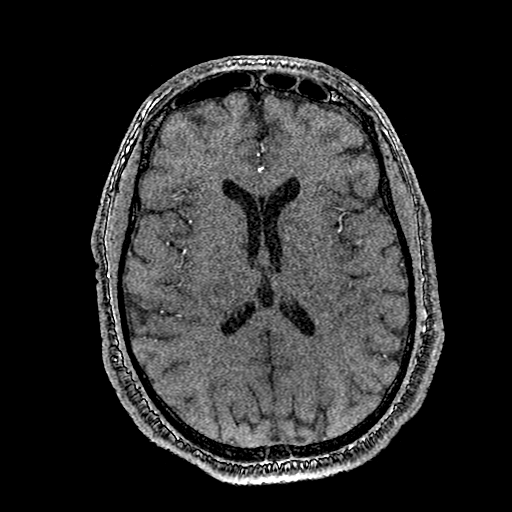
[im 174/182]
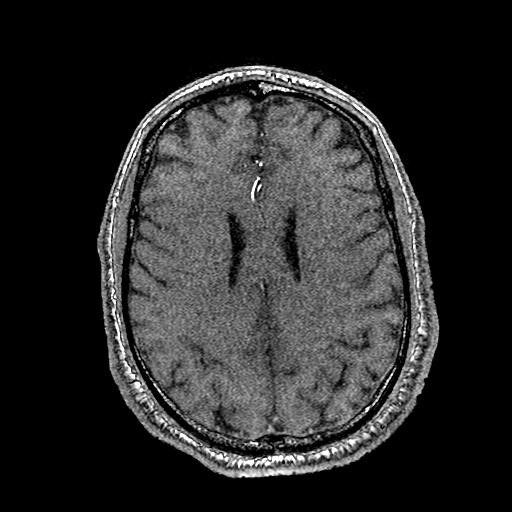

[19 of 48 positions shown; findings below may reference images not displayed]

FINDINGS: There is some asymmetry of the internal carotid arteries
bilaterally. Mild narrowing of the precavernous left internal
carotid artery measures less than 50% compared to the right. There
is no significant stenosis. Prominent posterior communicating
arteries are present bilaterally. The right A1 segment is dominant.
No A1 segment is present on the left. Both A2 segments fill from the
right. There is mild narrowing of distal A2 segments bilaterally.
The right M1 segment is intact. A proximal bifurcation is present on
the left. There is moderate narrowing of both M2 segments proximal
to the M3 bifurcations. Moderate distal segmental stenoses are
present bilaterally, left greater than right. No significant
aneurysm or focal occlusion is present.

The right vertebral artery is dominant to the left. The basilar
artery is small. Small P1 segments contribute to fetal type
posterior cerebral arteries bilaterally. AICA vessels are dominant
bilaterally.
IMPRESSION: 1. Asymmetric caliber of the distal internal carotid arteries
bilaterally suggesting a more proximal stenosis on the left.
2. Moderate narrowing of M2 segments on the left beyond a proximal
bifurcation.
3. Moderate distal segmental stenoses of MCA and ACA branch vessels,
left greater than right.
4. Fetal type posterior cerebral arteries with small P1 segments
bilaterally.

## 2018-03-29 ENCOUNTER — Ambulatory Visit (INDEPENDENT_AMBULATORY_CARE_PROVIDER_SITE_OTHER): Payer: 59 | Admitting: *Deleted

## 2018-03-29 DIAGNOSIS — I639 Cerebral infarction, unspecified: Secondary | ICD-10-CM

## 2018-03-29 NOTE — Progress Notes (Signed)
Wound Loop check in clinic. Steri-Strips removed, incision edges intact no redness or  bruising noted. Incision well healed. Battery status: Good. R-waves  0.5986mV. 0 symptom episodes, 0 tachy episodes, 0 pause episodes, 0 brady episodes. 0 AF episodes. Monthly summary reports and ROV with WC PRN

## 2018-03-30 ENCOUNTER — Encounter: Payer: Self-pay | Admitting: Physical Therapy

## 2018-03-30 ENCOUNTER — Ambulatory Visit: Payer: 59 | Attending: Internal Medicine | Admitting: Physical Therapy

## 2018-03-30 DIAGNOSIS — R262 Difficulty in walking, not elsewhere classified: Secondary | ICD-10-CM | POA: Insufficient documentation

## 2018-03-30 DIAGNOSIS — M6281 Muscle weakness (generalized): Secondary | ICD-10-CM | POA: Insufficient documentation

## 2018-03-30 NOTE — Therapy (Signed)
[facility] [address] [place], [place], [address] Phone: [phone]   Fax:  [phone]  Physical Therapy Evaluation  Patient Details  Name: Eric Patterson MRN: [id number] Date of Birth: 01/29/1966 Referring Provider (PT): Carmel Sacramentoyler Terry, [place]   Encounter Date: 03/30/2018  PT End of Session - 03/30/18 1554    Visit Number  1    Date for PT Re-Evaluation  05/30/18    PT Start Time  1515    PT Stop Time  1557    PT Time Calculation (min)  42 min    Activity Tolerance  Patient tolerated treatment well    Behavior During Therapy  [facility] for tasks assessed/performed       Past Medical History:  Diagnosis Date  . Hyperlipidemia   . Hypertension   . Stroke [facility](HCC) 2017   R side weakness deficits    Past Surgical History:  Procedure Laterality Date  . LOOP RECORDER INSERTION N/A 03/17/2018   Procedure: LOOP RECORDER INSERTION;  Surgeon: Regan Lemmingamnitz, Will Martin, MD;  Location: [facility] INVASIVE CV LAB;  Service: Cardiovascular;  Laterality: N/A;  . TEE WITHOUT CARDIOVERSION N/A 03/17/2018   Procedure: TRANSESOPHAGEAL ECHOCARDIOGRAM (TEE);  Surgeon: Wendall StadeNishan, Peter C, MD;  [facility];  Service: Cardiovascular;  Laterality: N/A;    There were no vitals filed for this visit.   Subjective Assessment - 03/30/18 1523    Subjective  Patient reports a CVA 03/15/18.  Was in the hospital 2 days and then to home, the PT in the hospital felt like he would benefit from further PT    Pertinent History  CVA 2017, DM    Currently in Pain?  No/denies         [facility] - 03/30/18 0001      Assessment   Medical Diagnosis  CVA, weakness    Referring Provider (PT)  Carmel Sacramentoyler Terry, PA    Onset Date/Surgical Date  03/15/18    Hand Dominance  Right      Precautions   Precautions  None      Balance Screen   Has the patient fallen in the past 6 months  No    Has the patient had a decrease in activity level because of a fear of  falling?   No    Is the patient reluctant to leave their home because of a fear of falling?   No      Home Environment   Additional Comments  housework, grandchildren over 72 and 681 years old      Prior Function   Level of Independence  Independent    Vocation  Full time employment    Vocation Requirements  maintenance of machines, lifting 5#    Leisure  no exercise      Posture/Postural Control   Posture Comments  fwd head, rounded shoulders      ROM / Strength   AROM / PROM / Strength  AROM;PROM;Strength      Strength   Overall Strength Comments  shoulders 4/5, hips 4-/5 except abduction 3+/5, knees right 4/5, left 4-/5 extension, 3+/5 for flexion      Ambulation/Gait   Gait Comments  some ataxic gait on the left, knees seems to go into flexion with weight bearing, good turn but the left leg did catch and needed to pick it up to turn      Standardized Balance Assessment   Standardized Balance Assessment  Timed Up and Go Test;Berg  Balance Test      Berg Balance Test   Sit to Stand  Able to stand without using hands and stabilize independently    Standing Unsupported  Able to stand safely 2 minutes    Sitting with Back Unsupported but Feet Supported on Floor or Stool  Able to sit safely and securely 2 minutes    Stand to Sit  Controls descent by using hands    Transfers  Able to transfer safely, definite need of hands    Standing Unsupported with Eyes Closed  Able to stand 10 seconds safely    Standing Ubsupported with Feet Together  Able to place feet together independently and stand 1 minute safely    From Standing, Reach Forward with Outstretched Arm  Can reach confidently >25 cm (10")    From Standing Position, Pick up Object from Floor  Able to pick up shoe safely and easily    From Standing Position, Turn to Look Behind Over each Shoulder  Looks behind from both sides and weight shifts well    Turn 360 Degrees  Able to turn 360 degrees safely in 4 seconds or less     Standing Unsupported, Alternately Place Feet on Step/Stool  Able to stand independently and safely and complete 8 steps in 20 seconds    Standing Unsupported, One Foot in Front  Able to plae foot ahead of the other independently and hold 30 seconds    Standing on One Leg  Able to lift leg independently and hold 5-10 seconds    Total Score  52      Timed Up and Go Test   Normal TUG (seconds)  13                Objective measurements completed on examination: See above findings.              PT Education - 03/30/18 1553    Education Details  High level balance activities, hip 4 way, side step, toe walk, heel walk, tandem walk    Person(s) Educated  Patient;Spouse    Methods  Explanation;Demonstration;Handout    Comprehension  Verbalized understanding;Returned demonstration       PT Short Term Goals - 03/30/18 1558      PT SHORT TERM GOAL #1   Title  Pt will be independent with HEP    Time  2    Period  Weeks    Status  New        PT Long Term Goals - 03/30/18 1559      PT LONG TERM GOAL #1   Title  increase LE strength to 4/5    Time  8    Period  Weeks    Status  New      PT LONG TERM GOAL #2   Title  Pt will ambulate up to 1000' over varying outdoor surfaces (grass, paved, curb, etc) without AD at mod I level in order to indicate safety in community and return to work.      Time  8    Period  Weeks    Status  New      PT LONG TERM GOAL #3   Title  Pt will be able to simulate work activities (climbing, getting onto floor and back up, etc) at independent level in order to demonstrate safe return to work.      Time  8    Period  Weeks    Status  New  Plan - 03/30/18 1555    Clinical Impression Statement  Patient had a second CVA on 03/15/18, he was in the hospital 2 days.  He is currently off work until December.  TUG was 13 seconds, Berg was 52/56, he has some ataxic motions of the left leg with walking, the left knee and the  hips are weak.  When I asked him to walk on the heels his right toes would go down to the floor    History and Personal Factors relevant to plan of care:  DM, past CVA    Clinical Presentation  Stable    Clinical Decision Making  Low    Rehab Potential  Good    PT Frequency  2x / week    PT Duration  8 weeks    PT Treatment/Interventions  ADLs/Self Care Home Management;Gait training;Neuromuscular re-education;Balance training;Therapeutic exercise;Therapeutic activities;Functional mobility training;Patient/family education    PT Next Visit Plan  work on strength and high level balance    Consulted and Agree with Plan of Care  Patient       Patient will benefit from skilled therapeutic intervention in order to improve the following deficits and impairments:  Abnormal gait, Decreased coordination, Difficulty walking, Decreased balance, Decreased strength  Visit Diagnosis: Muscle weakness (generalized) - Plan: PT plan of care cert/re-cert  Difficulty in walking, not elsewhere classified - Plan: PT plan of care cert/re-cert     Problem List Patient Active Problem List   Diagnosis Date Noted  . Tobacco use 03/16/2018  . Stroke (HCC) 03/16/2018  . Infarction of left basal ganglia (HCC) 03/15/2018  . Hyperlipidemia LDL goal <70 10/21/2016  . Chronic left-sided low back pain without sciatica 10/21/2016  . Hypertension 11/28/2015  . Lacunar infarction [facility]) 11/28/2015    Jearld Lesch., PT 03/30/2018, 4:03 PM  [facility] [address] [place], [place], [address] Phone: [phone]   Fax:  [phone]  Name: Eric Patterson MRN: [id number] Date of Birth: [birth year]

## 2018-04-05 ENCOUNTER — Ambulatory Visit: Payer: 59 | Admitting: Neurology

## 2018-04-05 ENCOUNTER — Ambulatory Visit (INDEPENDENT_AMBULATORY_CARE_PROVIDER_SITE_OTHER): Payer: 59 | Admitting: Neurology

## 2018-04-05 ENCOUNTER — Encounter: Payer: Self-pay | Admitting: Neurology

## 2018-04-05 ENCOUNTER — Ambulatory Visit: Payer: 59 | Admitting: Physical Therapy

## 2018-04-05 VITALS — BP 137/89 | HR 83 | Ht 66.0 in | Wt 164.6 lb

## 2018-04-05 DIAGNOSIS — M6281 Muscle weakness (generalized): Secondary | ICD-10-CM

## 2018-04-05 DIAGNOSIS — R262 Difficulty in walking, not elsewhere classified: Secondary | ICD-10-CM

## 2018-04-05 DIAGNOSIS — I639 Cerebral infarction, unspecified: Secondary | ICD-10-CM

## 2018-04-05 NOTE — Progress Notes (Signed)
[facility] [address] [place]. [place] [address] [phone]       OFFICE FOLLOW-UP NOTE  [name] Date of Birth:  05/20/1965 Medical Record Number:  [id number]   HPI: [name] is a 52 year old African-American male seen today for initial office follow-up visit following hospital admission for stroke in October 2019.  He is accompanied by his wife.  History is obtained from them and review of electronic medical records.  I personally reviewed imaging films.  He presented with sudden onset of right facial weakness upon arising on 03/15/2018.  He was also noted dragging his right leg.  He did have a prior history of stroke and had some residual right-sided weakness but this appeared to be more than baseline.  He was not a candidate for TPA as he presented outside the window.  CT scan of the head showed large acute left basal ganglia infarct.  MRI scan of the brain confirmed a large left basal ganglia infarct as well as showed an old right posterior corona radiata lacunar.  MRA of the brain showed mild to moderate intracranial atherosclerotic changes with short segment stenosis of the left M2 segment.  Carotid Doppler showed no significant extracranial stenosis.  Transthoracic echo showed normal ejection fraction.  Transesophageal echocardiogram was also normal.  LDL cholesterol was slightly elevated at 101 mg percent.  Hemoglobin A1c was 5.3.  Patient stroke was felt to be cryptogenic and he underwent loop recorder insertion and so for paroxysmal atrial fibrillation has not yet been found.  Urine drug screen was negative.  Patient was discharged home on aspirin and Plavix and states he is tolerated both medications well without bleeding or bruising.  He is participating in outpatient physical and occupational therapy.  He feels he is slowly returning back to normal.  He plans to return to work a month later.  He has tried to quit smoking but has managed to cut back but not  quit completely yet but plans to do so with the help of his primary physician.  He has no new complaints today.  There is no family history of strokes or heart attacks in a young age.  He has remote history of syphilis in 2016 but states he was treated for that.  He denies any history of DVT or pulmonary embolism. ROS:   14 system review of systems is positive for blurred vision, leg weakness and all other systems negative PMH:  Past Medical History:  Diagnosis Date  . Hyperlipidemia   . Hypertension   . Stroke [facility](HCC) 2017   R side weakness deficits    Social History:  Social History   Socioeconomic History  . Marital status: Married    Spouse name: Not on file  . Number of children: 1  . Years of education: 3712  . Highest education level: Not on file  Occupational History  . Occupation: EconomistTire Changer  Social Needs  . Financial resource strain: Not on file  . Food insecurity:    Worry: Not on file    Inability: Not on file  . Transportation needs:    Medical: Not on file    Non-medical: Not on file  Tobacco Use  . Smoking status: Current Every Day Smoker    Packs/day: 0.25    Types: Cigarettes  . Smokeless tobacco: Never Used  . Tobacco comment: smoke a whole week in a pack  Substance and Sexual Activity  . Alcohol use: Not Currently  . Drug use: No  .  Sexual activity: Not on file  Lifestyle  . Physical activity:    Days per week: Not on file    Minutes per session: Not on file  . Stress: Not on file  Relationships  . Social connections:    Talks on phone: Not on file    Gets together: Not on file    Attends religious service: Not on file    Active member of club or organization: Not on file    Attends meetings of clubs or organizations: Not on file    Relationship status: Not on file  . Intimate partner violence:    Fear of current or ex partner: Not on file    Emotionally abused: Not on file    Physically abused: Not on file    Forced sexual activity: Not on  file  Other Topics Concern  . Not on file  Social History Narrative   Fun/Hobby: Work on his truck     Medications:   Current Outpatient Medications on File Prior to Visit  Medication Sig Dispense Refill  . amLODipine (NORVASC) 10 MG tablet Take 1 tablet (10 mg total) by mouth daily. 30 tablet 0  . aspirin 81 MG EC tablet Take 1 tablet (81 mg total) by mouth daily for 21 days. STOP AFTER 3 weeks    . atorvastatin (LIPITOR) 40 MG tablet Take 1 tablet (40 mg total) by mouth daily at 6 PM. 30 tablet 0  . clopidogrel (PLAVIX) 75 MG tablet Take 1 tablet (75 mg total) by mouth daily. 30 tablet 1  . telmisartan-hydrochlorothiazide (MICARDIS HCT) 80-25 MG tablet Take 1 tablet by mouth daily. 30 tablet 0   No current facility-administered medications on file prior to visit.     Allergies:  No Known Allergies  Physical Exam General: well developed, well nourished middle-aged African-American male, seated, in no evident distress Head: head normocephalic and atraumatic.  Neck: supple with no carotid or supraclavicular bruits Cardiovascular: regular rate and rhythm, no murmurs Musculoskeletal: no deformity Skin:  no rash/petichiae Vascular:  Normal pulses all extremities Vitals:   04/05/18 0954  BP: 137/89  Pulse: 83   Neurologic Exam Mental Status: Awake and fully alert. Oriented to place and time. Recent and remote memory intact. Attention span, concentration and fund of knowledge appropriate. Mood and affect appropriate.  Cranial Nerves: Fundoscopic exam reveals sharp disc margins. Pupils equal, briskly reactive to light. Extraocular movements full without nystagmus. Visual fields full to confrontation. Hearing intact. Facial sensation intact. Face, tongue, palate moves normally and symmetrically.  Motor: Normal bulk and tone. Normal strength in all tested extremity muscles.  Diminished fine finger movements on the right.  Orbits left over right upper extremity. Sensory.: intact to touch  ,pinprick .position and vibratory sensation.  Coordination: Rapid alternating movements normal in all extremities. Finger-to-nose and heel-to-shin performed accurately bilaterally. Gait and Station: Arises from chair without difficulty. Stance is normal. Gait demonstrates normal stride length and balance . Able to heel, toe and tandem walk without difficulty.  Reflexes: 1+ and symmetric. Toes downgoing.   NIHSS  0 Modified Rankin  2   ASSESSMENT: 52 year male with cryptogenic left large basal ganglia infarct in October 2019.  Vascular risk factors of smoking, hypertension and hyperlipidemia.     PLAN: I had a long d/w patient and his wife about his recent stroke, risk for recurrent stroke/TIAs, personally independently reviewed imaging studies and stroke evaluation results and answered questions.Continue Plavix 75 mg daily and stop aspirin   for  secondary stroke prevention and maintain strict control of hypertension with blood pressure goal below 130/90, diabetes with hemoglobin A1c goal below 6.5% and lipids with LDL cholesterol goal below 70 mg/dL. I also advised the patient to eat a healthy diet with plenty of whole grains, cereals, fruits and vegetables, exercise regularly and maintain ideal body weight . He may consider participation in [place] stroke prevention trial if interested and was given information to take home to review and decide.Marland Kitchen He was also advised to quit smoking completely and seek help from his primary Md to do so.Followup in the future with my nurse practitioner Shanda Bumps in 6 months or call earlier if necessary. Greater than 50% of time during this 25 minute visit was spent on counseling,explanation of diagnosis, planning of further management, discussion with patient and family and coordination of care Delia Heady, MD  [facility] [address] [place], [place] [address]  Phone [phone] Fax [phone] Note: This document was  prepared with digital dictation and possible smart phrase technology. Any transcriptional errors that result from this process are unintentional

## 2018-04-05 NOTE — Therapy (Signed)
[facility] [address] [place], [place], [address] Phone: [phone]   Fax:  [phone]  Physical Therapy Treatment  Patient Details  Name: Eric Patterson MRN: [id number] Date of Birth: 04/11/1966 Referring Provider (PT): Carmel Sacramentoyler Terry, [place]   Encounter Date: 04/05/2018  PT End of Session - 04/05/18 1517    Visit Number  2    Date for PT Re-Evaluation  05/30/18    PT Start Time  1400    PT Stop Time  1445    PT Time Calculation (min)  45 min    Activity Tolerance  Patient tolerated treatment well    Behavior During Therapy  [facility] for tasks assessed/performed       Past Medical History:  Diagnosis Date  . Hyperlipidemia   . Hypertension   . Stroke [facility](HCC) 2017   R side weakness deficits    Past Surgical History:  Procedure Laterality Date  . LOOP RECORDER INSERTION N/A 03/17/2018   Procedure: LOOP RECORDER INSERTION;  Surgeon: Regan Lemmingamnitz, Will Martin, MD;  [facility] IN[facility];  Service: Cardiovascular;  Laterality: N/A;  . TEE WITHOUT CARDIOVERSION N/A 03/17/2018   Procedure: TRANSESOPHAGEAL ECHOCARDIOGRAM (TEE);  Surgeon: Wendall StadeNishan, Peter C, MD;  [facility];  Service: Cardiovascular;  Laterality: N/A;    There were no vitals filed for this visit.  Subjective Assessment - 04/05/18 1423    Subjective  Pt reports he feels a little stiff today    Pertinent History  CVA 2017, DM    Currently in Pain?  No/denies                       [facility]/Exercise - 04/05/18 0001      High Level Balance   High Level Balance Comments  stepping over obstacles reciprocally forward and lateral up/down X 3 ea      Exercises   Exercises  Shoulder;Knee/Hip      Knee/Hip Exercises: Aerobic   Nustep  L5 X 5 min UE/LE      Knee/Hip Exercises: Machines for Strengthening   Cybex Knee Extension  20 lbs 3X10    Cybex Knee Flexion  25 lbs 3X10    Cybex Leg Press  20 lbs 3X10    Other  Machine  row machine 25 lbs 3X10      Knee/Hip Exercises: Seated   Sit to Sand  2 sets;10 reps;without UE support      Shoulder Exercises: Standing   Other Standing Exercises  wall push ups X20 and wall ball rolls with weighted red ball at 90 deg flexion X 20 cw/cw    Other Standing Exercises  gripping light egg 1 min X 2, wrist Pro/Sup, flexon and ext with 4 lbs X 20 ea               PT Short Term Goals - 03/30/18 1558      PT SHORT TERM GOAL #1   Title  Pt will be independent with HEP    Time  2    Period  Weeks    Status  New        PT Long Term Goals - 03/30/18 1559      PT LONG TERM GOAL #1   Title  increase LE strength to 4/5    Time  8    Period  Weeks    Status  New      PT LONG TERM  GOAL #2   Title  Pt will ambulate up to 1000' over varying outdoor surfaces (grass, paved, curb, etc) without AD at mod I level in order to indicate safety in community and return to work.      Time  8    Period  Weeks    Status  New      PT LONG TERM GOAL #3   Title  Pt will be able to simulate work activities (climbing, getting onto floor and back up, etc) at independent level in order to demonstrate safe return to work.      Time  8    Period  Weeks    Status  New            Plan - 04/05/18 1517    Clinical Impression Statement  Session focused on leg strength and Lt UE strength along with dynamic balance training with good tolerance. He is motivated and shows good effort with PT. PT will continue to progress as able.    Rehab Potential  Good    PT Frequency  2x / week    PT Duration  8 weeks    PT Treatment/Interventions  ADLs/Self Care Home Management;Gait training;Neuromuscular re-education;Balance training;Therapeutic exercise;Therapeutic activities;Functional mobility training;Patient/family education    PT Next Visit Plan  work on strength and high level balance    Consulted and Agree with Plan of Care  Patient       Patient will benefit from skilled  therapeutic intervention in order to improve the following deficits and impairments:  Abnormal gait, Decreased coordination, Difficulty walking, Decreased balance, Decreased strength  Visit Diagnosis: Muscle weakness (generalized)  Difficulty in walking, not elsewhere classified     Problem List Patient Active Problem List   Diagnosis Date Noted  . Tobacco use 03/16/2018  . Stroke (HCC) 03/16/2018  . Infarction of left basal ganglia (HCC) 03/15/2018  . Hyperlipidemia LDL goal <70 10/21/2016  . Chronic left-sided low back pain without sciatica 10/21/2016  . Hypertension 11/28/2015  . Lacunar infarction [facility]) 11/28/2015    April Manson, PT,DPT 04/05/2018, 3:19 PM  [facility] [address] [place], [place], [address] Phone: [phone]   Fax:  [phone]  Name: Eric Patterson MRN: [id number] Date of Birth: 1965-07-16

## 2018-04-05 NOTE — Patient Instructions (Signed)
I had a long d/w patient and his wife about his recent stroke, risk for recurrent stroke/TIAs, personally independently reviewed imaging studies and stroke evaluation results and answered questions.Continue Plavix 75 mg daily and stop aspirin   for secondary stroke prevention and maintain strict control of hypertension with blood pressure goal below 130/90, diabetes with hemoglobin A1c goal below 6.5% and lipids with LDL cholesterol goal below 70 mg/dL. I also advised the patient to eat a healthy diet with plenty of whole grains, cereals, fruits and vegetables, exercise regularly and maintain ideal body weight . He may consider participation in [place] stroke prevention trial if interested and was given information to take home to review and decide.Marland Kitchen. He was also advised to quit smoking completely and seek help from his primary Md to do so.Followup in the future with my nurse practitioner Shanda BumpsJessica in 6 months or call earlier if necessary.  Stroke Prevention Some medical conditions and behaviors are associated with a higher chance of having a stroke. You can help prevent a stroke by making nutrition, lifestyle, and other changes, including managing any medical conditions you may have. What nutrition changes can be made?  Eat healthy foods. You can do this by: ? Choosing foods high in fiber, such as fresh fruits and vegetables and whole grains. ? Eating at least 5 or more servings of fruits and vegetables a day. Try to fill half of your plate at each meal with fruits and vegetables. ? Choosing lean protein foods, such as lean cuts of meat, poultry without skin, fish, tofu, beans, and nuts. ? Eating low-fat dairy products. ? Avoiding foods that are high in salt (sodium). This can help lower blood pressure. ? Avoiding foods that have saturated fat, trans fat, and cholesterol. This can help prevent high cholesterol. ? Avoiding processed and premade foods.  Follow your health care provider's specific  guidelines for losing weight, controlling high blood pressure (hypertension), lowering high cholesterol, and managing diabetes. These may include: ? Reducing your daily calorie intake. ? Limiting your daily sodium intake to 1,500 milligrams (mg). ? Using only healthy fats for cooking, such as olive oil, canola oil, or sunflower oil. ? Counting your daily carbohydrate intake. What lifestyle changes can be made?  Maintain a healthy weight. Talk to your health care provider about your ideal weight.  Get at least 30 minutes of moderate physical activity at least 5 days a week. Moderate activity includes brisk walking, biking, and swimming.  Do not use any products that contain nicotine or tobacco, such as cigarettes and e-cigarettes. If you need help quitting, ask your health care provider. It may also be helpful to avoid exposure to secondhand smoke.  Limit alcohol intake to no more than 1 drink a day for nonpregnant women and 2 drinks a day for men. One drink equals 12 oz of beer, 5 oz of wine, or 1 oz of hard liquor.  Stop any illegal drug use.  Avoid taking birth control pills. Talk to your health care provider about the risks of taking birth control pills if: ? You are over 51 years old. ? You smoke. ? You get migraines. ? You have ever had a blood clot. What other changes can be made?  Manage your cholesterol levels. ? Eating a healthy diet is important for preventing high cholesterol. If cholesterol cannot be managed through diet alone, you may also need to take medicines. ? Take any prescribed medicines to control your cholesterol as told by your health care provider.  Manage your diabetes. ? Eating a healthy diet and exercising regularly are important parts of managing your blood sugar. If your blood sugar cannot be managed through diet and exercise, you may need to take medicines. ? Take any prescribed medicines to control your diabetes as told by your health care  provider.  Control your hypertension. ? To reduce your risk of stroke, try to keep your blood pressure below 130/80. ? Eating a healthy diet and exercising regularly are an important part of controlling your blood pressure. If your blood pressure cannot be managed through diet and exercise, you may need to take medicines. ? Take any prescribed medicines to control hypertension as told by your health care provider. ? Ask your health care provider if you should monitor your blood pressure at home. ? Have your blood pressure checked every year, even if your blood pressure is normal. Blood pressure increases with age and some medical conditions.  Get evaluated for sleep disorders (sleep apnea). Talk to your health care provider about getting a sleep evaluation if you snore a lot or have excessive sleepiness.  Take over-the-counter and prescription medicines only as told by your health care provider. Aspirin or blood thinners (antiplatelets or anticoagulants) may be recommended to reduce your risk of forming blood clots that can lead to stroke.  Make sure that any other medical conditions you have, such as atrial fibrillation or atherosclerosis, are managed. What are the warning signs of a stroke? The warning signs of a stroke can be easily remembered as BEFAST.  B is for balance. Signs include: ? Dizziness. ? Loss of balance or coordination. ? Sudden trouble walking.  E is for eyes. Signs include: ? A sudden change in vision. ? Trouble seeing.  F is for face. Signs include: ? Sudden weakness or numbness of the face. ? The face or eyelid drooping to one side.  A is for arms. Signs include: ? Sudden weakness or numbness of the arm, usually on one side of the body.  S is for speech. Signs include: ? Trouble speaking (aphasia). ? Trouble understanding.  T is for time. ? These symptoms may represent a serious problem that is an emergency. Do not wait to see if the symptoms will go away.  Get medical help right away. Call your local emergency services (911 in the [place] Do not drive yourself to the hospital.  Other signs of stroke may include: ? A sudden, severe headache with no known cause. ? Nausea or vomiting. ? Seizure.  Where to find more information: For more information, visit:  American Stroke Association: www.strokeassociation.org  National Stroke Association: www.stroke.org  Summary  You can prevent a stroke by eating healthy, exercising, not smoking, limiting alcohol intake, and managing any medical conditions you may have.  Do not use any products that contain nicotine or tobacco, such as cigarettes and e-cigarettes. If you need help quitting, ask your health care provider. It may also be helpful to avoid exposure to secondhand smoke.  Remember BEFAST for warning signs of stroke. Get help right away if you or a loved one has any of these signs. This information is not intended to replace advice given to you by your health care provider. Make sure you discuss any questions you have with your health care provider. Document Released: 06/10/2004 Document Revised: 06/08/2016 Document Reviewed: 06/08/2016 Elsevier Interactive Patient Education  [employer].

## 2018-04-10 ENCOUNTER — Ambulatory Visit: Payer: 59 | Admitting: Physical Therapy

## 2018-04-10 ENCOUNTER — Encounter: Payer: Self-pay | Admitting: Physical Therapy

## 2018-04-10 DIAGNOSIS — M6281 Muscle weakness (generalized): Secondary | ICD-10-CM

## 2018-04-10 DIAGNOSIS — R262 Difficulty in walking, not elsewhere classified: Secondary | ICD-10-CM

## 2018-04-10 NOTE — Therapy (Signed)
[facility] [address] [place], [place], [address] Phone: [phone]   Fax:  [phone]  Physical Therapy Treatment  Patient Details  Name: Hardin Hardenbrook MRN: [id number] Date of Birth: 17-Oct-1965 Referring Provider (PT): Carmel Sacramento, [place]   Encounter Date: 04/10/2018  PT End of Session - 04/10/18 1509    Visit Number  3    Date for PT Re-Evaluation  05/30/18    PT Start Time  1428    PT Stop Time  1510    PT Time Calculation (min)  42 min    Activity Tolerance  Patient tolerated treatment well       Past Medical History:  Diagnosis Date  . Hyperlipidemia   . Hypertension   . Stroke [facility]) 2017   R side weakness deficits    Past Surgical History:  Procedure Laterality Date  . LOOP RECORDER INSERTION N/A 03/17/2018   Procedure: LOOP RECORDER INSERTION;  Surgeon: Regan Lemming, MD;  [facility] CV LAB;  Service: Cardiovascular;  Laterality: N/A;  . TEE WITHOUT CARDIOVERSION N/A 03/17/2018   Procedure: TRANSESOPHAGEAL ECHOCARDIOGRAM (TEE);  Surgeon: Wendall Stade, MD;  Location: [facility] ENDOSCOPY;  Service: Cardiovascular;  Laterality: N/A;    There were no vitals filed for this visit.  Subjective Assessment - 04/10/18 1429    Subjective  "Good"    Currently in Pain?  No/denies                       [facility] Adult PT Treatment/Exercise - 04/10/18 0001      Ambulation/Gait   Stairs  Yes    Stairs Assistance  5: Supervision;6: Modified independent (Device/Increase time)    Stair Management Technique  One rail Right;Alternating pattern    Number of Stairs  48    Height of Stairs  6      High Level Balance   High Level Balance Activities  Side stepping;Negotiating over obstacles    High Level Balance Comments  front and side stepping over foam rolls      Exercises   Exercises  Shoulder;Knee/Hip      Knee/Hip Exercises: Aerobic   Nustep  L5 X 5 min UE/LE      Knee/Hip  Exercises: Machines for Strengthening   Cybex Knee Extension  20 lbs 2X10, LLE 15lb x10    Cybex Knee Flexion  25 lbs 2X10, LLE 10lb x10     Cybex Leg Press  40lb 2x10, LLE 20lb 2x10     Other Machine  row machine 25 lbs then 35lb 2X10      Knee/Hip Exercises: Standing   Lateral Step Up  Both;1 set;10 reps;Hand Hold: 0;Step Height: 6"      Knee/Hip Exercises: Seated   Sit to Sand  2 sets;10 reps;without UE support   OHP with yellow ball               PT Short Term Goals - 03/30/18 1558      PT SHORT TERM GOAL #1   Title  Pt will be independent with HEP    Time  2    Period  Weeks    Status  New        PT Long Term Goals - 03/30/18 1559      PT LONG TERM GOAL #1   Title  increase LE strength to 4/5    Time  8    Period  Weeks  Status  New      PT LONG TERM GOAL #2   Title  Pt will ambulate up to 1000' over varying outdoor surfaces (grass, paved, curb, etc) without AD at mod I level in order to indicate safety in community and return to work.      Time  8    Period  Weeks    Status  New      PT LONG TERM GOAL #3   Title  Pt will be able to simulate work activities (climbing, getting onto floor and back up, etc) at independent level in order to demonstrate safe return to work.      Time  8    Period  Weeks    Status  New            Plan - 04/10/18 1510    Clinical Impression Statement  Focused on functional interventions, balance, and LE strengthening. No issues with today's interventions. Pt very cautious to negotiate stairs without rail. LLE fatigues quick when isolated. Some clearance issues when stepping over objects.    Rehab Potential  Good    PT Frequency  2x / week    PT Duration  8 weeks    PT Treatment/Interventions  ADLs/Self Care Home Management;Gait training;Neuromuscular re-education;Balance training;Therapeutic exercise;Therapeutic activities;Functional mobility training;Patient/family education       Patient will benefit from  skilled therapeutic intervention in order to improve the following deficits and impairments:  Abnormal gait, Decreased coordination, Difficulty walking, Decreased balance, Decreased strength  Visit Diagnosis: Muscle weakness (generalized)  Difficulty in walking, not elsewhere classified     Problem List Patient Active Problem List   Diagnosis Date Noted  . Tobacco use 03/16/2018  . Stroke (HCC) 03/16/2018  . Infarction of left basal ganglia (HCC) 03/15/2018  . Hyperlipidemia LDL goal <70 10/21/2016  . Chronic left-sided low back pain without sciatica 10/21/2016  . Hypertension 11/28/2015  . Lacunar infarction [facility](HCC) 11/28/2015    Grayce Sessionsonald G Casilda Pickerill, PTA 04/10/2018, 3:14 PM  [facility] [address] [place], [place], [address] Phone: [phone]   Fax:  [phone]  Name: Gillis SantaHenry Bartek MRN: [id number] Date of Birth: 08/06/1965

## 2018-04-12 ENCOUNTER — Ambulatory Visit: Payer: 59 | Admitting: Physical Therapy

## 2018-04-12 DIAGNOSIS — M6281 Muscle weakness (generalized): Secondary | ICD-10-CM | POA: Diagnosis not present

## 2018-04-12 DIAGNOSIS — R262 Difficulty in walking, not elsewhere classified: Secondary | ICD-10-CM

## 2018-04-12 NOTE — Therapy (Signed)
[place] [address] [address] [place], [place], [address] Phone: [phone]   Fax:  [phone]  Physical Therapy Treatment  Patient Details  Name: Eric Patterson MRN: [id number] Date of Birth: [birth year] Referring Provider (PT): Emelia Loron, [place]   Encounter Date: 04/12/2018  PT End of Session - 04/12/18 1134    Visit Number  4    Date for PT Re-Evaluation  05/30/18    PT Start Time  1050    PT Stop Time  1134    PT Time Calculation (min)  44 min    Activity Tolerance  Patient tolerated treatment well    Behavior During Therapy  [facility] for tasks assessed/performed       Past Medical History:  Diagnosis Date  . Hyperlipidemia   . Hypertension   . Stroke [facility]) 2017   R side weakness deficits    Past Surgical History:  Procedure Laterality Date  . LOOP RECORDER INSERTION N/A 03/17/2018   Procedure: LOOP RECORDER INSERTION;  Surgeon: Constance Haw, MD;  Location: [place] CV LAB;  Service: Cardiovascular;  Laterality: N/A;  . TEE WITHOUT CARDIOVERSION N/A 03/17/2018   Procedure: TRANSESOPHAGEAL ECHOCARDIOGRAM (TEE);  Surgeon: Josue Hector, MD;  Location: [facility] ENDOSCOPY;  Service: Cardiovascular;  Laterality: N/A;    There were no vitals filed for this visit.  Subjective Assessment - 04/12/18 1049    Subjective  "Im doing good"    Currently in Pain?  No/denies         [facility] PT Assessment - 04/12/18 0001      Strength   Overall Strength Comments  4/5 bilateral hip & knees                   [facility] Adult PT Treatment/Exercise - 04/12/18 0001      High Level Balance   High Level Balance Activities  Negotitating around obstacles      Exercises   Exercises  Knee/Hip      Knee/Hip Exercises: Aerobic   Nustep  L5 X 7 min UE/LE      Knee/Hip Exercises: Machines for Strengthening   Cybex Knee Extension  25# 3x10    Cybex Knee Flexion  35# 3x10    Cybex Leg Press  40# x10 50# 2x10    Other  Machine  row 35# 3x10      Knee/Hip Exercises: Standing   Lateral Step Up  Both;1 set;10 reps;Hand Hold: 0;Step Height: 6"    Walking with Sports Cord  30# all directions x5               PT Short Term Goals - 04/12/18 1052      PT SHORT TERM GOAL #1   Title  Pt will be independent with HEP    Baseline  met 12/29/15    Time  2    Period  Weeks    Status  Achieved        PT Long Term Goals - 04/12/18 1139      PT LONG TERM GOAL #1   Title  increase LE strength to 4/5    Time  8    Period  Weeks    Status  Achieved      PT LONG TERM GOAL #3   Title  Pt will be able to simulate work activities (climbing, getting onto floor and back up, etc) at independent level in order to demonstrate safe return to work.  Time  8    Period  Weeks    Status  Achieved            Plan - 04/12/18 1134    Clinical Impression Statement  Pt reported that he felt good about being discharged from PT and that him and his wife were going to join the [employer]. Pt demonstrated improved bilateral strength as seen by increased weight and reps this session as compared to last. Pt still presents with deminished L LE strength and tends to drag L LE when stepping over obstacles. Pt continues to pregres towards therapy goals and is prepared to be discarged.     Rehab Potential  Good    PT Frequency  2x / week    PT Duration  8 weeks    PT Treatment/Interventions  ADLs/Self Care Home Management;Gait training;Neuromuscular re-education;Balance training;Therapeutic exercise;Therapeutic activities;Functional mobility training;Patient/family education    PT Next Visit Plan  Prepared to discharge    Consulted and Agree with Plan of Care  Patient       Patient will benefit from skilled therapeutic intervention in order to improve the following deficits and impairments:  Abnormal gait, Decreased coordination, Difficulty walking, Decreased balance, Decreased strength  Visit Diagnosis: Muscle weakness  (generalized)  Difficulty in walking, not elsewhere classified     Problem List Patient Active Problem List   Diagnosis Date Noted  . Tobacco use 03/16/2018  . Stroke ([place]) 03/16/2018  . Infarction of left basal ganglia ([place]) 03/15/2018  . Hyperlipidemia LDL goal <70 10/21/2016  . Chronic left-sided low back pain without sciatica 10/21/2016  . Hypertension 11/28/2015  . Lacunar infarction [facility]) 11/28/2015    [name], Eunice 04/12/2018, 11:40 AM  [place], [place], [address] Phone: [phone]   Fax:  [phone]  Name: Eric Patterson MRN: [id number] Date of Birth: 1966-02-09

## 2018-04-17 ENCOUNTER — Ambulatory Visit: Payer: 59 | Admitting: Emergency Medicine

## 2018-04-19 ENCOUNTER — Ambulatory Visit (INDEPENDENT_AMBULATORY_CARE_PROVIDER_SITE_OTHER): Payer: 59

## 2018-04-19 DIAGNOSIS — I639 Cerebral infarction, unspecified: Secondary | ICD-10-CM | POA: Diagnosis not present

## 2018-04-20 NOTE — Progress Notes (Signed)
Carelink Summary Report / Loop Recorder 

## 2018-05-22 ENCOUNTER — Ambulatory Visit (INDEPENDENT_AMBULATORY_CARE_PROVIDER_SITE_OTHER): Payer: 59

## 2018-05-22 DIAGNOSIS — I639 Cerebral infarction, unspecified: Secondary | ICD-10-CM

## 2018-05-23 LAB — CUP PACEART REMOTE DEVICE CHECK
MDC IDC PG IMPLANT DT: 20191101
MDC IDC SESS DTM: 20200106183939

## 2018-05-23 NOTE — Progress Notes (Signed)
Carelink Summary Report / Loop Recorder 

## 2018-06-02 LAB — CUP PACEART REMOTE DEVICE CHECK
Date Time Interrogation Session: 20191204173714
MDC IDC PG IMPLANT DT: 20191101

## 2018-06-26 ENCOUNTER — Ambulatory Visit (INDEPENDENT_AMBULATORY_CARE_PROVIDER_SITE_OTHER): Payer: 59

## 2018-06-26 DIAGNOSIS — I639 Cerebral infarction, unspecified: Secondary | ICD-10-CM

## 2018-06-27 LAB — CUP PACEART REMOTE DEVICE CHECK
Implantable Pulse Generator Implant Date: 20191101
MDC IDC SESS DTM: 20200208183638

## 2018-07-07 NOTE — Progress Notes (Signed)
Carelink Summary Report / Loop Recorder 

## 2018-07-27 ENCOUNTER — Ambulatory Visit (INDEPENDENT_AMBULATORY_CARE_PROVIDER_SITE_OTHER): Payer: 59 | Admitting: *Deleted

## 2018-07-27 DIAGNOSIS — I639 Cerebral infarction, unspecified: Secondary | ICD-10-CM

## 2018-07-29 LAB — CUP PACEART REMOTE DEVICE CHECK
Implantable Pulse Generator Implant Date: 20191101
MDC IDC SESS DTM: 20200312183958

## 2018-08-03 NOTE — Progress Notes (Signed)
Carelink Summary Report / Loop Recorder 

## 2018-08-29 ENCOUNTER — Ambulatory Visit (INDEPENDENT_AMBULATORY_CARE_PROVIDER_SITE_OTHER): Payer: 59 | Admitting: *Deleted

## 2018-08-29 ENCOUNTER — Other Ambulatory Visit: Payer: Self-pay

## 2018-08-29 DIAGNOSIS — I639 Cerebral infarction, unspecified: Secondary | ICD-10-CM

## 2018-08-29 LAB — CUP PACEART REMOTE DEVICE CHECK
Date Time Interrogation Session: 20200414204026
Implantable Pulse Generator Implant Date: 20191101

## 2018-09-05 NOTE — Progress Notes (Signed)
Carelink Summary Report / Loop Recorder 

## 2018-10-02 ENCOUNTER — Other Ambulatory Visit: Payer: Self-pay

## 2018-10-02 ENCOUNTER — Ambulatory Visit (INDEPENDENT_AMBULATORY_CARE_PROVIDER_SITE_OTHER): Payer: 59 | Admitting: *Deleted

## 2018-10-02 DIAGNOSIS — I639 Cerebral infarction, unspecified: Secondary | ICD-10-CM | POA: Diagnosis not present

## 2018-10-02 LAB — CUP PACEART REMOTE DEVICE CHECK
Date Time Interrogation Session: 20200517215333
Implantable Pulse Generator Implant Date: 20191101

## 2018-10-03 ENCOUNTER — Telehealth: Payer: Self-pay

## 2018-10-03 NOTE — Telephone Encounter (Signed)
Left vm for patient that visit will be change to video due to COVID 19.I stated it will not be a in office visit. Need consent from pt to file insurance and to do video. ALso need to know if pt has cell phone with camera.

## 2018-10-04 ENCOUNTER — Ambulatory Visit: Payer: 59 | Admitting: Adult Health

## 2018-10-04 NOTE — Telephone Encounter (Signed)
LEft second email that visit today is at 215pm for video due to covid 19.

## 2018-10-11 NOTE — Progress Notes (Signed)
Carelink Summary Report / Loop Recorder 

## 2018-11-03 ENCOUNTER — Ambulatory Visit (INDEPENDENT_AMBULATORY_CARE_PROVIDER_SITE_OTHER): Payer: 59 | Admitting: *Deleted

## 2018-11-03 DIAGNOSIS — I639 Cerebral infarction, unspecified: Secondary | ICD-10-CM | POA: Diagnosis not present

## 2018-11-04 LAB — CUP PACEART REMOTE DEVICE CHECK
Date Time Interrogation Session: 20200619221012
Implantable Pulse Generator Implant Date: 20191101

## 2018-11-08 NOTE — Progress Notes (Signed)
Carelink Summary Report / Loop Recorder 

## 2018-12-06 ENCOUNTER — Ambulatory Visit (INDEPENDENT_AMBULATORY_CARE_PROVIDER_SITE_OTHER): Payer: 59 | Admitting: *Deleted

## 2018-12-06 DIAGNOSIS — I639 Cerebral infarction, unspecified: Secondary | ICD-10-CM | POA: Diagnosis not present

## 2018-12-07 ENCOUNTER — Emergency Department (HOSPITAL_COMMUNITY)
Admission: EM | Admit: 2018-12-07 | Discharge: 2018-12-07 | Disposition: A | Discharge status: Home / Self Care | Payer: Worker's Compensation | Attending: Emergency Medicine | Admitting: Emergency Medicine

## 2018-12-07 ENCOUNTER — Other Ambulatory Visit: Payer: Self-pay

## 2018-12-07 ENCOUNTER — Encounter (HOSPITAL_COMMUNITY): Payer: Self-pay

## 2018-12-07 ENCOUNTER — Emergency Department (HOSPITAL_COMMUNITY): Payer: Worker's Compensation

## 2018-12-07 DIAGNOSIS — Z7902 Long term (current) use of antithrombotics/antiplatelets: Secondary | ICD-10-CM | POA: Diagnosis not present

## 2018-12-07 DIAGNOSIS — Y990 Civilian activity done for income or pay: Secondary | ICD-10-CM | POA: Diagnosis not present

## 2018-12-07 DIAGNOSIS — Y929 Unspecified place or not applicable: Secondary | ICD-10-CM | POA: Diagnosis not present

## 2018-12-07 DIAGNOSIS — Z8673 Personal history of transient ischemic attack (TIA), and cerebral infarction without residual deficits: Secondary | ICD-10-CM | POA: Insufficient documentation

## 2018-12-07 DIAGNOSIS — S9031XA Contusion of right foot, initial encounter: Secondary | ICD-10-CM | POA: Diagnosis not present

## 2018-12-07 DIAGNOSIS — F17210 Nicotine dependence, cigarettes, uncomplicated: Secondary | ICD-10-CM | POA: Insufficient documentation

## 2018-12-07 DIAGNOSIS — W231XXA Caught, crushed, jammed, or pinched between stationary objects, initial encounter: Secondary | ICD-10-CM | POA: Diagnosis not present

## 2018-12-07 DIAGNOSIS — I10 Essential (primary) hypertension: Secondary | ICD-10-CM | POA: Diagnosis not present

## 2018-12-07 DIAGNOSIS — Y939 Activity, unspecified: Secondary | ICD-10-CM | POA: Diagnosis not present

## 2018-12-07 DIAGNOSIS — S99929A Unspecified injury of unspecified foot, initial encounter: Secondary | ICD-10-CM

## 2018-12-07 DIAGNOSIS — S99921A Unspecified injury of right foot, initial encounter: Secondary | ICD-10-CM | POA: Diagnosis present

## 2018-12-07 DIAGNOSIS — Z79899 Other long term (current) drug therapy: Secondary | ICD-10-CM | POA: Insufficient documentation

## 2018-12-07 HISTORY — DX: Unspecified injury of unspecified foot, initial encounter: S99.929A

## 2018-12-07 LAB — CUP PACEART REMOTE DEVICE CHECK
Date Time Interrogation Session: 20200722223726
Implantable Pulse Generator Implant Date: 20191101

## 2018-12-07 MED ORDER — OXYCODONE-ACETAMINOPHEN 5-325 MG PO TABS: 1.0000 | ORAL_TABLET | ORAL | 0 refills | Status: DC | PRN

## 2018-12-07 MED ORDER — OXYCODONE-ACETAMINOPHEN 5-325 MG PO TABS
1.0000 | ORAL_TABLET | Freq: Once | ORAL | Status: AC
  Administered 2018-12-07: 1 via ORAL
  Filled 2018-12-07: qty 1

## 2018-12-07 NOTE — Discharge Instructions (Addendum)
As discussed, it is important that you elevate your foot as much as possible and apply ice packs on and off to help minimize swelling.  Keep your foot wrapped with weightbearing, but do not wear the Ace wrap continuously or at bedtime.  Use the crutches as needed for walking.  You may contact 1 of the orthopedic providers listed to arrange a follow-up appointment in 1 week if not improving.  Return to the ER for any worsening symptoms such as numbness of your foot, discoloration, or increasing swelling

## 2018-12-07 NOTE — ED Triage Notes (Signed)
Pt brought in by EMS due to 100 lb board press feel on right foot while at work

## 2018-12-07 NOTE — ED Provider Notes (Signed)
[facility] EMERGENCY DEPARTMENT Provider Note   CSN: [id number] Arrival date & time: 12/07/18  1039     History   Chief Complaint Chief Complaint  Patient presents with  . Foot Injury    HPI Eric Patterson is a 53 y.o. male.     HPI  Eric Patterson is a 53 y.o. male who presents to the Emergency Department complaining of throbbing pain to his right foot after a heavy metal machine fell on his foot.  He states this is a work related injury that occurred shortly before ER arrival.  The right foot was pinned under the machine for 2-3 minutes per patient.  He was wearing soft tennis shoes at the time of the injury.  Pain is localized to the dorsal foot and worsens with attempted weight bearing and movement of his toes.  He notes having a small abrasion to the lateral foot, but denies bleeding, ankle pain, numbness of his foot or toes and pain or swelling proximal to the ankle.     Past Medical History:  Diagnosis Date  . Hyperlipidemia   . Hypertension   . Stroke [facility](HCC) 2017   R side weakness deficits    Patient Active Problem List   Diagnosis Date Noted  . Tobacco use 03/16/2018  . Stroke (HCC) 03/16/2018  . Infarction of left basal ganglia (HCC) 03/15/2018  . Hyperlipidemia LDL goal <70 10/21/2016  . Chronic left-sided low back pain without sciatica 10/21/2016  . Hypertension 11/28/2015  . Lacunar infarction (HCC) 11/28/2015    Past Surgical History:  Procedure Laterality Date  . LOOP RECORDER INSERTION N/A 03/17/2018   Procedure: LOOP RECORDER INSERTION;  Surgeon: Regan Lemmingamnitz, Will Martin, MD;  Location: [facility] INVASIVE CV LAB;  Service: Cardiovascular;  Laterality: N/A;  . TEE WITHOUT CARDIOVERSION N/A 03/17/2018   Procedure: TRANSESOPHAGEAL ECHOCARDIOGRAM (TEE);  Surgeon: Wendall StadeNishan, Peter C, MD;  [facility];  Service: Cardiovascular;  Laterality: N/A;        Home Medications    Prior to Admission medications   Medication Sig Start Date End Date Taking?  Authorizing Provider  amLODipine (NORVASC) 10 MG tablet Take 1 tablet (10 mg total) by mouth daily. 03/17/18   Zannie CoveJoseph, Preetha, MD  atorvastatin (LIPITOR) 40 MG tablet Take 1 tablet (40 mg total) by mouth daily at 6 PM. 03/17/18   Zannie CoveJoseph, Preetha, MD  clopidogrel (PLAVIX) 75 MG tablet Take 1 tablet (75 mg total) by mouth daily. 03/18/18   Zannie CoveJoseph, Preetha, MD  oxyCODONE-acetaminophen (PERCOCET/ROXICET) 5-325 MG tablet Take 1 tablet by mouth every 4 (four) hours as needed. 12/07/18   Leondre Taul, PA-C  telmisartan-hydrochlorothiazide (MICARDIS HCT) 80-25 MG tablet Take 1 tablet by mouth daily. 01/09/18   Janace ArisBast, Traci A, NP    Family History Family History  Problem Relation Age of Onset  . Heart attack Mother   . Stroke Mother   . Stroke Brother 6750  . Stroke Other     Social History Social History   Tobacco Use  . Smoking status: Current Every Day Smoker    Packs/day: 0.25    Types: Cigarettes  . Smokeless tobacco: Never Used  . Tobacco comment: smoke a whole week in a pack  Substance Use Topics  . Alcohol use: Not Currently  . Drug use: No     Allergies   Patient has no known allergies.   Review of Systems Review of Systems  Constitutional: Negative for chills and fever.  Musculoskeletal: Positive for arthralgias (pain and swelling of the  right foot). Negative for joint swelling.  Skin: Negative for color change.  Neurological: Negative for weakness and numbness.     Physical Exam Updated Vital Signs BP (!) 171/95 (BP Location: Right Arm)   Pulse (!) 59   Temp 97.7 F (36.5 C) (Oral)   Resp 16   Ht 5\' 6"  (1.676 m)   Wt 72.6 kg   SpO2 99%   BMI 25.82 kg/m   Physical Exam Vitals signs and nursing note reviewed.  Constitutional:      General: He is not in acute distress.    Appearance: He is well-developed.  HENT:     Head: Atraumatic.  Cardiovascular:     Rate and Rhythm: Normal rate and regular rhythm.     Pulses: Normal pulses.     Comments: DP and PT  pulses are palpable bilaterally Pulmonary:     Effort: Pulmonary effort is normal.     Breath sounds: Normal breath sounds.  Musculoskeletal:        General: Swelling, tenderness and signs of injury present.     Comments: Diffuse ttp of the dorsal right foot, mild edema noted.  Small superficial abrasion to lateral foot w/o bleeding or ecchymosis.  Compartments are soft. No tenderness proximal to the ankle.  No open wounds.  Extremity is warm and pink  Skin:    General: Skin is warm.     Capillary Refill: Capillary refill takes less than 2 seconds.     Coloration: Skin is not pale.     Findings: No bruising or erythema.  Neurological:     General: No focal deficit present.     Mental Status: He is alert.     Sensory: No sensory deficit.     Motor: No weakness or abnormal muscle tone.      ED Treatments / Results  Labs (all labs ordered are listed, but only abnormal results are displayed) Labs Reviewed - No data to display  EKG None  Radiology Dg Foot Complete Right  Result Date: 12/07/2018 CLINICAL DATA:  Right foot pain, swelling.  Trauma. EXAM: RIGHT FOOT COMPLETE - 3+ VIEW COMPARISON:  None. FINDINGS: There is no evidence of fracture or dislocation. 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 Small os peroneum. There is no evidence of arthropathy or other focal bone abnormality. Soft tissues are unremarkable. IMPRESSION: No acute osseous abnormality, right foot. Electronically Signed   By: Duanne GuessNicholas  Plundo M.D.   On: 12/07/2018 11:33    Procedures Procedures (including critical care time)  Medications Ordered in ED Medications  oxyCODONE-acetaminophen (PERCOCET/ROXICET) 5-325 MG per tablet 1 tablet (1 tablet Oral Given 12/07/18 1130)     Initial Impression / Assessment and Plan / ED Course  I have reviewed the triage vital signs and the nursing notes.  Pertinent labs & imaging results that were available during my care of the patient were reviewed by me and  considered in my medical decision making (see chart for details).        Patient with contusion of right foot secondary to crush injury.  No open wounds.  Neurovascularly intact.  Compartments of the extremity are soft.    Patient agrees to care plan with RICE therapy and close orthopedic follow-up.  I will refrain from giving NSAIDs due to his history of elevated creatinine.  Crutches dispensed an Ace wrap applied to the foot for support with wear instructions discussed.  Pt is hypertensive, states that he did not take his BP medication this  morning.  He denies sx's at present except for foot pain.  He has his BP meds with him and was witnessed taking his medication prior to d/c.  He was counseled on importance of taking his medication daily.    Strict return precautions were discussed  Final Clinical Impressions(s) / ED Diagnoses   Final diagnoses:  Contusion of right foot, initial encounter    ED Discharge Orders         Ordered    oxyCODONE-acetaminophen (PERCOCET/ROXICET) 5-325 MG tablet  Every 4 hours PRN     12/07/18 1212           Kem Parkinson, PA-C 12/08/18 2215    Milton Ferguson, MD 12/13/18 1921

## 2018-12-20 ENCOUNTER — Emergency Department (HOSPITAL_COMMUNITY): Payer: 59

## 2018-12-20 ENCOUNTER — Other Ambulatory Visit: Payer: Self-pay

## 2018-12-20 ENCOUNTER — Inpatient Hospital Stay (HOSPITAL_COMMUNITY)
Admission: EM | Admit: 2018-12-20 | Discharge: 2018-12-21 | DRG: 066 | Disposition: A | Discharge status: Home Health | Payer: 59 | Attending: Internal Medicine | Admitting: Internal Medicine

## 2018-12-20 ENCOUNTER — Encounter (HOSPITAL_COMMUNITY): Payer: Self-pay

## 2018-12-20 DIAGNOSIS — M545 Low back pain: Secondary | ICD-10-CM | POA: Diagnosis present

## 2018-12-20 DIAGNOSIS — Z79899 Other long term (current) drug therapy: Secondary | ICD-10-CM

## 2018-12-20 DIAGNOSIS — Z95818 Presence of other cardiac implants and grafts: Secondary | ICD-10-CM

## 2018-12-20 DIAGNOSIS — I63412 Cerebral infarction due to embolism of left middle cerebral artery: Principal | ICD-10-CM | POA: Diagnosis present

## 2018-12-20 DIAGNOSIS — Z823 Family history of stroke: Secondary | ICD-10-CM

## 2018-12-20 DIAGNOSIS — Z720 Tobacco use: Secondary | ICD-10-CM | POA: Diagnosis present

## 2018-12-20 DIAGNOSIS — E785 Hyperlipidemia, unspecified: Secondary | ICD-10-CM | POA: Diagnosis present

## 2018-12-20 DIAGNOSIS — Z8673 Personal history of transient ischemic attack (TIA), and cerebral infarction without residual deficits: Secondary | ICD-10-CM

## 2018-12-20 DIAGNOSIS — X58XXXA Exposure to other specified factors, initial encounter: Secondary | ICD-10-CM | POA: Diagnosis present

## 2018-12-20 DIAGNOSIS — E875 Hyperkalemia: Secondary | ICD-10-CM | POA: Diagnosis present

## 2018-12-20 DIAGNOSIS — I10 Essential (primary) hypertension: Secondary | ICD-10-CM | POA: Diagnosis not present

## 2018-12-20 DIAGNOSIS — R200 Anesthesia of skin: Secondary | ICD-10-CM | POA: Diagnosis present

## 2018-12-20 DIAGNOSIS — I639 Cerebral infarction, unspecified: Secondary | ICD-10-CM | POA: Diagnosis present

## 2018-12-20 DIAGNOSIS — G8321 Monoplegia of upper limb affecting right dominant side: Secondary | ICD-10-CM | POA: Diagnosis present

## 2018-12-20 DIAGNOSIS — F17210 Nicotine dependence, cigarettes, uncomplicated: Secondary | ICD-10-CM | POA: Diagnosis present

## 2018-12-20 DIAGNOSIS — Z20828 Contact with and (suspected) exposure to other viral communicable diseases: Secondary | ICD-10-CM | POA: Diagnosis present

## 2018-12-20 DIAGNOSIS — Z8249 Family history of ischemic heart disease and other diseases of the circulatory system: Secondary | ICD-10-CM

## 2018-12-20 DIAGNOSIS — Z7902 Long term (current) use of antithrombotics/antiplatelets: Secondary | ICD-10-CM

## 2018-12-20 DIAGNOSIS — G8929 Other chronic pain: Secondary | ICD-10-CM | POA: Diagnosis present

## 2018-12-20 DIAGNOSIS — S92341A Displaced fracture of fourth metatarsal bone, right foot, initial encounter for closed fracture: Secondary | ICD-10-CM | POA: Diagnosis present

## 2018-12-20 DIAGNOSIS — R29702 NIHSS score 2: Secondary | ICD-10-CM | POA: Diagnosis present

## 2018-12-20 LAB — PROTIME-INR
INR: 1.1 (ref 0.8–1.2)
Prothrombin Time: 14 seconds (ref 11.4–15.2)

## 2018-12-20 LAB — I-STAT CHEM 8, ED
BUN: 18 mg/dL (ref 6–20)
Calcium, Ion: 1.03 mmol/L — ABNORMAL LOW (ref 1.15–1.40)
Chloride: 104 mmol/L (ref 98–111)
Creatinine, Ser: 1.1 mg/dL (ref 0.61–1.24)
Glucose, Bld: 91 mg/dL (ref 70–99)
HCT: 51 % (ref 39.0–52.0)
Hemoglobin: 17.3 g/dL — ABNORMAL HIGH (ref 13.0–17.0)
Potassium: 7.3 mmol/L (ref 3.5–5.1)
Sodium: 135 mmol/L (ref 135–145)
TCO2: 26 mmol/L (ref 22–32)

## 2018-12-20 LAB — COMPREHENSIVE METABOLIC PANEL
ALT: 32 U/L (ref 0–44)
AST: 17 U/L (ref 15–41)
Albumin: 3.8 g/dL (ref 3.5–5.0)
Alkaline Phosphatase: 93 U/L (ref 38–126)
Anion gap: 9 (ref 5–15)
BUN: 11 mg/dL (ref 6–20)
CO2: 24 mmol/L (ref 22–32)
Calcium: 8.8 mg/dL — ABNORMAL LOW (ref 8.9–10.3)
Chloride: 102 mmol/L (ref 98–111)
Creatinine, Ser: 1.14 mg/dL (ref 0.61–1.24)
GFR calc Af Amer: >60 mL/min (ref >60)
GFR calc non Af Amer: >60 mL/min (ref >60)
Glucose, Bld: 106 mg/dL — ABNORMAL HIGH (ref 70–99)
Potassium: 3.9 mmol/L (ref 3.5–5.1)
Sodium: 135 mmol/L (ref 135–145)
Total Bilirubin: 0.6 mg/dL (ref 0.3–1.2)
Total Protein: 7.3 g/dL (ref 6.5–8.1)

## 2018-12-20 LAB — DIFFERENTIAL
Abs Immature Granulocytes: 0.03 10*3/uL (ref 0.00–0.07)
Basophils Absolute: 0.1 10*3/uL (ref 0.0–0.1)
Basophils Relative: 1 %
Eosinophils Absolute: 0.2 10*3/uL (ref 0.0–0.5)
Eosinophils Relative: 2 %
Immature Granulocytes: 0 %
Lymphocytes Relative: 22 %
Lymphs Abs: 2.4 10*3/uL (ref 0.7–4.0)
Monocytes Absolute: 0.7 10*3/uL (ref 0.1–1.0)
Monocytes Relative: 7 %
Neutro Abs: 7.3 10*3/uL (ref 1.7–7.7)
Neutrophils Relative %: 68 %

## 2018-12-20 LAB — CBC
HCT: 48.3 % (ref 39.0–52.0)
Hemoglobin: 16.3 g/dL (ref 13.0–17.0)
MCH: 30.6 pg (ref 26.0–34.0)
MCHC: 33.7 g/dL (ref 30.0–36.0)
MCV: 90.8 fL (ref 80.0–100.0)
Platelets: 450 10*3/uL — ABNORMAL HIGH (ref 150–400)
RBC: 5.32 MIL/uL (ref 4.22–5.81)
RDW: 12.8 % (ref 11.5–15.5)
WBC: 10.7 10*3/uL — ABNORMAL HIGH (ref 4.0–10.5)
nRBC: 0 % (ref 0.0–0.2)

## 2018-12-20 LAB — APTT: aPTT: 28 seconds (ref 24–36)

## 2018-12-20 MED ORDER — SODIUM CHLORIDE 0.9% FLUSH
3.0000 mL | Freq: Once | INTRAVENOUS | Status: DC
Start: 2018-12-20 — End: 2018-12-21

## 2018-12-20 NOTE — ED Triage Notes (Signed)
Patient states that he had right arm numbness around 12pm today. He had a CAT scan early this morning and thought it was related to the scan. Stated that right arm became heavy and unable to move it.  Patient still has weakness in right hand at this time.

## 2018-12-20 NOTE — ED Provider Notes (Signed)
[place] EMERGENCY DEPARTMENT Provider Note   CSN: [id number] Arrival date & time: 12/20/18  1814    History   Chief Complaint Chief Complaint  Patient presents with  . Numbness    Right Arm Numbess    HPI Eric Patterson is a 53 y.o. male.   The history is provided by the patient.  He has history of hypertension, hyperlipidemia, stroke and comes in because of heaviness in his right arm.  He recently had an injury to his right foot and had a CT scan done today.  When he returned from CT scan, he laid down to take a nap.  This was at about 2:30 PM.  When he woke up, he noted that his right arm felt heavy.  He denies any actual numbness.  Denies any headache.  Since arriving in the hospital, the heaviness is gone away and he feels that his arm is back to its baseline.  He denies any heaviness in his leg and denies any difficulty speaking.  He denies headache.  He is a cigarette smoker.  He does have an implanted loop recorder placed following his prior stroke.  Past Medical History:  Diagnosis Date  . Foot injury 12/07/2018  . Hyperlipidemia   . Hypertension   . Stroke [facility]) 2017   R side weakness deficits    Patient Active Problem List   Diagnosis Date Noted  . Tobacco use 03/16/2018  . Stroke ([place]) 03/16/2018  . Infarction of left basal ganglia ([place]) 03/15/2018  . Hyperlipidemia LDL goal <70 10/21/2016  . Chronic left-sided low back pain without sciatica 10/21/2016  . Hypertension 11/28/2015  . Lacunar infarction ([place]) 11/28/2015    Past Surgical History:  Procedure Laterality Date  . LOOP RECORDER INSERTION N/A 03/17/2018   Procedure: LOOP RECORDER INSERTION;  Surgeon: Constance Haw, MD;  Location: [place] CV LAB;  Service: Cardiovascular;  Laterality: N/A;  . TEE WITHOUT CARDIOVERSION N/A 03/17/2018   Procedure: TRANSESOPHAGEAL ECHOCARDIOGRAM (TEE);  Surgeon: Josue Hector, MD;  Location: [facility] ENDOSCOPY;  Service: Cardiovascular;   Laterality: N/A;        Home Medications    Prior to Admission medications   Medication Sig Start Date End Date Taking? Authorizing Provider  amLODipine (NORVASC) 10 MG tablet Take 1 tablet (10 mg total) by mouth daily. 03/17/18   Domenic Polite, MD  atorvastatin (LIPITOR) 40 MG tablet Take 1 tablet (40 mg total) by mouth daily at 6 PM. 03/17/18   Domenic Polite, MD  clopidogrel (PLAVIX) 75 MG tablet Take 1 tablet (75 mg total) by mouth daily. 03/18/18   Domenic Polite, MD  oxyCODONE-acetaminophen (PERCOCET/ROXICET) 5-325 MG tablet Take 1 tablet by mouth every 4 (four) hours as needed. 12/07/18   Triplett, Tammy, PA-C  telmisartan-hydrochlorothiazide (MICARDIS HCT) 80-25 MG tablet Take 1 tablet by mouth daily. 01/09/18   Orvan July, NP    Family History Family History  Problem Relation Age of Onset  . Heart attack Mother   . Stroke Mother   . Stroke Brother 72  . Stroke Other     Social History Social History   Tobacco Use  . Smoking status: Current Every Day Smoker    Packs/day: 0.25    Types: Cigarettes  . Smokeless tobacco: Never Used  . Tobacco comment: smoke a whole week in a pack  Substance Use Topics  . Alcohol use: Not Currently  . Drug use: No     Allergies   Patient has no  known allergies.   Review of Systems Review of Systems  All other systems reviewed and are negative.    Physical Exam Updated Vital Signs BP (!) 183/98 (BP Location: Left Arm)   Pulse 61   Temp 98.3 F (36.8 C) (Oral)   Resp 16   Ht 5\' 8"  (1.727 m)   Wt 72.6 kg   SpO2 100%   BMI 24.33 kg/m   Physical Exam Vitals signs and nursing note reviewed.    53 year old male, resting comfortably and in no acute distress. Vital signs are significant for elevated blood pressure. Oxygen saturation is 100%, which is normal. Head is normocephalic and atraumatic. PERRLA, EOMI. Oropharynx is clear. Neck is nontender and supple without adenopathy or JVD.  There are no carotid bruits.  Back is nontender and there is no CVA tenderness. Lungs are clear without rales, wheezes, or rhonchi. Chest is nontender. Heart has regular rate and rhythm without murmur. Abdomen is soft, flat, nontender without masses or hepatosplenomegaly and peristalsis is normoactive. Extremities have no cyanosis or edema.  Posterior splint present on right lower leg/foot. Skin is warm and dry without rash. Neurologic: Mental status is normal, cranial nerves are intact.  There is very mild weakness of the right arm and right leg with strength 4+/5.  There is normal strength of the left arm and left leg.  There is very slight right pronator drift.  Sensory exam is normal.  ED Treatments / Results  Labs (all labs ordered are listed, but only abnormal results are displayed) Labs Reviewed  CBC - Abnormal; Notable for the following components:      Result Value   WBC 10.7 (*)    Platelets 450 (*)    All other components within normal limits  COMPREHENSIVE METABOLIC PANEL - Abnormal; Notable for the following components:   Glucose, Bld 106 (*)    Calcium 8.8 (*)    All other components within normal limits  I-STAT CHEM 8, ED - Abnormal; Notable for the following components:   Potassium 7.3 (*)    Calcium, Ion 1.03 (*)    Hemoglobin 17.3 (*)    All other components within normal limits  PROTIME-INR  APTT  DIFFERENTIAL  CBG MONITORING, ED    EKG EKG Interpretation  Date/Time:  Wednesday December 20 2018 23:45:40 EDT Ventricular Rate:  60 PR Interval:    QRS Duration: 93 QT Interval:  411 QTC Calculation: 411 R Axis:   47 Text Interpretation:  Sinus rhythm Probable left atrial enlargement Borderline ST elevation, anterolateral leads When compared with ECG of 03/15/2018, No significant change was found Confirmed by Dione BoozeGlick, Osamah Schmader ([id number]) on 12/20/2018 11:52:44 PM   Radiology Ct Head Wo Contrast  Result Date: 12/20/2018 CLINICAL DATA:  Right arm numbness since 12 p.m. today. EXAM: CT HEAD WITHOUT  CONTRAST TECHNIQUE: Contiguous axial images were obtained from the base of the skull through the vertex without intravenous contrast. COMPARISON:  The report and 1st image from the examination dated 03/15/2018. FINDINGS: Brain: Old bilateral basal ganglia region lacunar infarcts. Normal size and position of the ventricles. No intracranial hemorrhage, mass lesion or CT evidence of acute infarction. Vascular: No hyperdense vessel or unexpected calcification. Skull: Normal. Negative for fracture or focal lesion. Sinuses/Orbits: Retained secretions in the right frontal sinus. Unremarkable orbits. Other: None. IMPRESSION: 1. No acute abnormality. 2. Old bilateral basal ganglia region lacunar infarcts. Electronically Signed   By: Beckie SaltsSteven  Reid M.D.   On: 12/20/2018 21:08    Procedures Procedures (  including critical care time)  Medications Ordered in ED Medications  sodium chloride flush (NS) 0.9 % injection 3 mL (has no administration in time range)     Initial Impression / Assessment and Plan / ED Course  I have reviewed the triage vital signs and the nursing notes.  Pertinent labs & imaging results that were available during my care of the patient were reviewed by me and considered in my medical decision making (see chart for details).  Stroke with mild right-sided deficits.  Old records reviewed confirming prior stroke which did involve the right side.  However, last documented neurologic exam in November 2019 showed normal strength and all 4 extremities.  CT shows evidence of prior lacunar infarcts.  ECG is unchanged from baseline.  I-STAT potassium was markedly elevated at 7.3, but potassium on comprehensive metabolic panel was normal.  ECG shows no evidence of hyperkalemia and the patient has no reason for hyperkalemia and it is felt that the i-STAT value is factitious.  He is outside the window for thrombolytic therapy and has deficits that are too mild to warrant endovascular approach, so code  stroke is not activated.  MRI has been ordered.  Case is discussed with Dr. Clyde LundborgNiu of [facility] hospitalist, who agrees to admit the patient.  Case also discussed with Dr. Wilford CornerArora of neurology service agrees to see the patient in consultation.  Of note, his CT scan done earlier today was of his right foot injury and did show evidence of intra-articular fracture of the base of the fourth metatarsal.  Final Clinical Impressions(s) / ED Diagnoses   Final diagnoses:  Cerebrovascular accident (CVA), unspecified mechanism (HCC)  Elevated blood pressure reading with diagnosis of hypertension    ED Discharge Orders    None       Dione BoozeGlick, Tyric Rodeheaver, MD 12/21/18 0045

## 2018-12-20 NOTE — Progress Notes (Signed)
Carelink Summary Report / Loop Recorder 

## 2018-12-21 ENCOUNTER — Observation Stay (HOSPITAL_COMMUNITY): Payer: 59

## 2018-12-21 ENCOUNTER — Encounter (HOSPITAL_COMMUNITY): Payer: Self-pay | Admitting: *Deleted

## 2018-12-21 ENCOUNTER — Ambulatory Visit (HOSPITAL_BASED_OUTPATIENT_CLINIC_OR_DEPARTMENT_OTHER): Payer: 59

## 2018-12-21 DIAGNOSIS — E785 Hyperlipidemia, unspecified: Secondary | ICD-10-CM

## 2018-12-21 DIAGNOSIS — Z720 Tobacco use: Secondary | ICD-10-CM

## 2018-12-21 DIAGNOSIS — Z79899 Other long term (current) drug therapy: Secondary | ICD-10-CM | POA: Diagnosis not present

## 2018-12-21 DIAGNOSIS — M545 Low back pain: Secondary | ICD-10-CM | POA: Diagnosis present

## 2018-12-21 DIAGNOSIS — Z95818 Presence of other cardiac implants and grafts: Secondary | ICD-10-CM | POA: Diagnosis not present

## 2018-12-21 DIAGNOSIS — Z8673 Personal history of transient ischemic attack (TIA), and cerebral infarction without residual deficits: Secondary | ICD-10-CM | POA: Diagnosis not present

## 2018-12-21 DIAGNOSIS — F17210 Nicotine dependence, cigarettes, uncomplicated: Secondary | ICD-10-CM | POA: Diagnosis present

## 2018-12-21 DIAGNOSIS — Z20828 Contact with and (suspected) exposure to other viral communicable diseases: Secondary | ICD-10-CM | POA: Diagnosis present

## 2018-12-21 DIAGNOSIS — R200 Anesthesia of skin: Secondary | ICD-10-CM | POA: Diagnosis present

## 2018-12-21 DIAGNOSIS — X58XXXA Exposure to other specified factors, initial encounter: Secondary | ICD-10-CM | POA: Diagnosis present

## 2018-12-21 DIAGNOSIS — I639 Cerebral infarction, unspecified: Secondary | ICD-10-CM | POA: Diagnosis not present

## 2018-12-21 DIAGNOSIS — I10 Essential (primary) hypertension: Secondary | ICD-10-CM | POA: Diagnosis present

## 2018-12-21 DIAGNOSIS — R29702 NIHSS score 2: Secondary | ICD-10-CM | POA: Diagnosis present

## 2018-12-21 DIAGNOSIS — Z8249 Family history of ischemic heart disease and other diseases of the circulatory system: Secondary | ICD-10-CM | POA: Diagnosis not present

## 2018-12-21 DIAGNOSIS — G8321 Monoplegia of upper limb affecting right dominant side: Secondary | ICD-10-CM | POA: Diagnosis present

## 2018-12-21 DIAGNOSIS — E875 Hyperkalemia: Secondary | ICD-10-CM | POA: Diagnosis not present

## 2018-12-21 DIAGNOSIS — S92341A Displaced fracture of fourth metatarsal bone, right foot, initial encounter for closed fracture: Secondary | ICD-10-CM | POA: Diagnosis present

## 2018-12-21 DIAGNOSIS — G8929 Other chronic pain: Secondary | ICD-10-CM | POA: Diagnosis present

## 2018-12-21 DIAGNOSIS — I63412 Cerebral infarction due to embolism of left middle cerebral artery: Secondary | ICD-10-CM | POA: Diagnosis present

## 2018-12-21 DIAGNOSIS — Z823 Family history of stroke: Secondary | ICD-10-CM | POA: Diagnosis not present

## 2018-12-21 DIAGNOSIS — Z7902 Long term (current) use of antithrombotics/antiplatelets: Secondary | ICD-10-CM | POA: Diagnosis not present

## 2018-12-21 LAB — POCT I-STAT, CHEM 8
BUN: 18 mg/dL (ref 6–20)
Calcium, Ion: 1.03 mmol/L — ABNORMAL LOW (ref 1.15–1.40)
Chloride: 104 mmol/L (ref 98–111)
Creatinine, Ser: 1.1 mg/dL (ref 0.61–1.24)
Glucose, Bld: 91 mg/dL (ref 70–99)
HCT: 51 % (ref 39.0–52.0)
Hemoglobin: 17.3 g/dL — ABNORMAL HIGH (ref 13.0–17.0)
Potassium: 7.3 mmol/L (ref 3.5–5.1)
Sodium: 135 mmol/L (ref 135–145)
TCO2: 26 mmol/L (ref 22–32)

## 2018-12-21 LAB — LIPID PANEL
Cholesterol: 114 mg/dL (ref 0–200)
HDL: 30 mg/dL — ABNORMAL LOW (ref >40)
LDL Cholesterol: 66 mg/dL (ref 0–99)
Total CHOL/HDL Ratio: 3.8 RATIO
Triglycerides: 88 mg/dL (ref <150)
VLDL: 18 mg/dL (ref 0–40)

## 2018-12-21 LAB — SARS CORONAVIRUS 2 (TAT 6-24 HRS): SARS Coronavirus 2: NEGATIVE

## 2018-12-21 LAB — HEMOGLOBIN A1C
Hgb A1c MFr Bld: 5.5 % (ref 4.8–5.6)
Mean Plasma Glucose: 111.15 mg/dL

## 2018-12-21 LAB — ECHOCARDIOGRAM COMPLETE
Height: 68 in
Weight: 2572.8 oz

## 2018-12-21 LAB — POTASSIUM: Potassium: 4 mmol/L (ref 3.5–5.1)

## 2018-12-21 MED ORDER — ASPIRIN EC 81 MG PO TBEC: 81.0000 mg | DELAYED_RELEASE_TABLET | Freq: Every day | ORAL | Status: DC

## 2018-12-21 MED ORDER — CLOPIDOGREL BISULFATE 75 MG PO TABS
75.0000 mg | ORAL_TABLET | Freq: Every day | ORAL | Status: DC
  Administered 2018-12-21: 75 mg via ORAL
  Filled 2018-12-21: qty 1

## 2018-12-21 MED ORDER — SENNOSIDES-DOCUSATE SODIUM 8.6-50 MG PO TABS: 1.0000 | ORAL_TABLET | Freq: Every evening | ORAL | Status: DC | PRN

## 2018-12-21 MED ORDER — ACETAMINOPHEN 325 MG PO TABS: 650.0000 mg | ORAL_TABLET | Freq: Four times a day (QID) | ORAL | Status: DC | PRN

## 2018-12-21 MED ORDER — OXYCODONE-ACETAMINOPHEN 5-325 MG PO TABS: 1.0000 | ORAL_TABLET | ORAL | Status: DC | PRN

## 2018-12-21 MED ORDER — TRAMADOL HCL 50 MG PO TABS: 50.0000 mg | ORAL_TABLET | Freq: Three times a day (TID) | ORAL | Status: DC | PRN

## 2018-12-21 MED ORDER — ASPIRIN EC 81 MG PO TBEC: 81.0000 mg | DELAYED_RELEASE_TABLET | Freq: Every day | ORAL | 2 refills | Status: AC

## 2018-12-21 MED ORDER — STROKE: EARLY STAGES OF RECOVERY BOOK
Freq: Once | Status: DC
  Filled 2018-12-21: qty 1

## 2018-12-21 MED ORDER — ATORVASTATIN CALCIUM 40 MG PO TABS: 40.0000 mg | ORAL_TABLET | Freq: Every day | ORAL | Status: DC

## 2018-12-21 MED ORDER — ENOXAPARIN SODIUM 40 MG/0.4ML ~~LOC~~ SOLN
40.0000 mg | SUBCUTANEOUS | Status: DC
  Administered 2018-12-21: 40 mg via SUBCUTANEOUS
  Filled 2018-12-21: qty 0.4

## 2018-12-21 MED ORDER — NICOTINE 14 MG/24HR TD PT24: 14.0000 mg | MEDICATED_PATCH | Freq: Every day | TRANSDERMAL | 0 refills | Status: DC

## 2018-12-21 MED ORDER — HYDRALAZINE HCL 20 MG/ML IJ SOLN: 5.0000 mg | INTRAMUSCULAR | Status: DC | PRN

## 2018-12-21 MED ORDER — NICOTINE 21 MG/24HR TD PT24: 21.0000 mg | MEDICATED_PATCH | Freq: Every day | TRANSDERMAL | 0 refills | Status: DC

## 2018-12-21 MED ORDER — NICOTINE 21 MG/24HR TD PT24
21.0000 mg | MEDICATED_PATCH | Freq: Every day | TRANSDERMAL | Status: DC
  Administered 2018-12-21: 21 mg via TRANSDERMAL
  Filled 2018-12-21: qty 1

## 2018-12-21 MED ORDER — GABAPENTIN 300 MG PO CAPS: 300.0000 mg | ORAL_CAPSULE | Freq: Two times a day (BID) | ORAL | Status: DC | PRN

## 2018-12-21 MED ORDER — ASPIRIN EC 81 MG PO TBEC
81.0000 mg | DELAYED_RELEASE_TABLET | Freq: Every day | ORAL | Status: DC
  Administered 2018-12-21: 81 mg via ORAL
  Filled 2018-12-21: qty 1

## 2018-12-21 MED ORDER — ONDANSETRON HCL 4 MG/2ML IJ SOLN: 4.0000 mg | Freq: Three times a day (TID) | INTRAMUSCULAR | Status: DC | PRN

## 2018-12-21 MED ORDER — GADOBUTROL 1 MMOL/ML IV SOLN
7.0000 mL | Freq: Once | INTRAVENOUS | Status: AC | PRN
  Administered 2018-12-21: 10:00:00 7 mL via INTRAVENOUS

## 2018-12-21 MED ORDER — NICOTINE 7 MG/24HR TD PT24: 7.0000 mg | MEDICATED_PATCH | Freq: Every day | TRANSDERMAL | 0 refills | Status: DC

## 2018-12-21 NOTE — TOC Transition Note (Signed)
Transition of Care [facility]) - CM/SW Discharge Note   Patient Details  Name: Eric Patterson MRN: [id number] Date of Birth: 1966/01/04  Transition of Care [facility] CM/SW Contact:  Pollie Friar, RN Phone Number: 12/21/2018, 7:24 PM   Clinical Narrative:    Pt discharging home with [facility] ordered. CM will contact [facility] and notify them of need for [facility] services. They will have the services arranged.  Wheelchair delivered to the patients room. Wife to provide transportation home.   Final next level of care: [place] [facility] through his [employer] will arrange [facility] PT/OT) Barriers to Discharge: No Barriers Identified   Patient Goals and CMS Choice        Discharge Placement                       Discharge Plan and Services   Discharge Planning Services: CM Consult Post Acute Care Choice: Home Health, Durable Medical Equipment          DME Arranged: Wheelchair manual DME Agency: [employer] Date DME Agency Contacted: 12/21/18   Representative spoke with at DME Agency: [place]: PT, OT [place] Agency: ([facility])        Social Determinants of Health (SDOH) Interventions     Readmission Risk Interventions No flowsheet data found.

## 2018-12-21 NOTE — ED Notes (Signed)
ED TO INPATIENT HANDOFF REPORT  ED Nurse Name and Phone #:  [phone]  S Name/Age/Gender Eric Patterson 53 y.o. male Room/Bed: 035C/035C  Code Status   Code Status: Prior  Home/SNF/Other Home Patient oriented to: self, place, time and situation Is this baseline? Yes   Triage Complete: Triage complete  Chief Complaint Near syncope  Triage Note Patient states that he had right arm numbness around 12pm today. He had a CAT scan early this morning and thought it was related to the scan. Stated that right arm became heavy and unable to move it.  Patient still has weakness in right hand at this time.    Allergies No Known Allergies  Level of Care/Admitting Diagnosis ED Disposition    ED Disposition Condition Comment   Admit  [facility] [[id number]]  Level of Care: Telemetry Medical [104]  I expect the patient will be discharged within 24 hours: No (not a candidate for 5C-Observation unit)  Covid Evaluation: Asymptomatic Screening Protocol (No Symptoms)  Diagnosis: Stroke [facility](HCC) [[id number]]) [[id number]]  Admitting Physician: Lorretta HarpNIU, XILIN [[id number]]  Attending Physician: Lorretta HarpNIU, XILIN [[id number]]  PT Class (Do Not Modify): Observation [104]  PT Acc Code (Do Not Modify): Observation [10022]       B Medical/Surgery History Past Medical History:  Diagnosis Date  . Foot injury 12/07/2018  . Hyperlipidemia   . Hypertension   . Stroke [facility](HCC) 2017   R side weakness deficits   Past Surgical History:  Procedure Laterality Date  . LOOP RECORDER INSERTION N/A 03/17/2018   Procedure: LOOP RECORDER INSERTION;  Surgeon: Regan Lemmingamnitz, Will Martin, MD;  Location: [facility] INVASIVE CV LAB;  Service: Cardiovascular;  Laterality: N/A;  . TEE WITHOUT CARDIOVERSION N/A 03/17/2018   Procedure: TRANSESOPHAGEAL ECHOCARDIOGRAM (TEE);  Surgeon: Wendall StadeNishan, Peter C, MD;  [facility];  Service: Cardiovascular;  Laterality: N/A;     A IV Location/Drains/Wounds Patient Lines/Drains/Airways Status    Active Line/Drains/Airways    Name:   Placement date:   Placement time:   Site:   Days:   Peripheral IV 12/21/18 Right Antecubital   12/21/18    0044    Antecubital   less than 1          Intake/Output Last 24 hours No intake or output data in the 24 hours ending 12/21/18 0139  Labs/Imaging Results for orders placed or performed during the hospital encounter of 12/20/18 (from the past 48 hour(s))  Protime-INR     Status: None   Collection Time: 12/20/18  6:36 PM  Result Value Ref Range   Prothrombin Time 14.0 11.4 - 15.2 seconds   INR 1.1 0.8 - 1.2    Comment: (NOTE) INR goal varies based on device and disease states. Performed at [facility], [address] [place], [place] [address]   APTT     Status: None   Collection Time: 12/20/18  6:36 PM  Result Value Ref Range   aPTT 28 24 - 36 seconds    Comment: Performed at [facility], [address] [place], [place] [address]  CBC     Status: Abnormal   Collection Time: 12/20/18  6:36 PM  Result Value Ref Range   WBC 10.7 (H) 4.0 - 10.5 K/uL   RBC 5.32 4.22 - 5.81 MIL/uL   Hemoglobin 16.3 13.0 - 17.0 g/dL   HCT 62.148.3 30.839.0 - 65.752.0 %   MCV 90.8 80.0 - 100.0 fL   MCH 30.6 26.0 - 34.0 pg  MCHC 33.7 30.0 - 36.0 g/dL   RDW 12.8 11.5 - 15.5 %   Platelets 450 (H) 150 - 400 K/uL   nRBC 0.0 0.0 - 0.2 %    Comment: Performed at [facility], [place] [address], [place], [place] [address]  Differential     Status: None   Collection Time: 12/20/18  6:36 PM  Result Value Ref Range   Neutrophils Relative % 68 %   Neutro Abs 7.3 1.7 - 7.7 K/uL   Lymphocytes Relative 22 %   Lymphs Abs 2.4 0.7 - 4.0 K/uL   Monocytes Relative 7 %   Monocytes Absolute 0.7 0.1 - 1.0 K/uL   Eosinophils Relative 2 %   Eosinophils Absolute 0.2 0.0 - 0.5 K/uL   Basophils Relative 1 %   Basophils Absolute 0.1 0.0 - 0.1 K/uL   Immature Granulocytes 0 %   Abs Immature Granulocytes 0.03 0.00 - 0.07 K/uL    Comment: Performed at [facility], [place] [address], [place], [place] [address]  Comprehensive metabolic panel     Status: Abnormal   Collection Time: 12/20/18  6:36 PM  Result Value Ref Range   Sodium 135 135 - 145 mmol/L   Potassium 3.9 3.5 - 5.1 mmol/L   Chloride 102 98 - 111 mmol/L   CO2 24 22 - 32 mmol/L   Glucose, Bld 106 (H) 70 - 99 mg/dL   BUN 11 6 - 20 mg/dL   Creatinine, Ser 1.14 0.61 - 1.24 mg/dL   Calcium 8.8 (L) 8.9 - 10.3 mg/dL   Total Protein 7.3 6.5 - 8.1 g/dL   Albumin 3.8 3.5 - 5.0 g/dL   AST 17 15 - 41 U/L   ALT 32 0 - 44 U/L   Alkaline Phosphatase 93 38 - 126 U/L   Total Bilirubin 0.6 0.3 - 1.2 mg/dL   GFR calc non Af Amer >60 >60 mL/min   GFR calc Af Amer >60 >60 mL/min   Anion gap 9 5 - 15    Comment: Performed at [facility] [address] [place], [place] [address]  I-stat chem 8, ED     Status: Abnormal   Collection Time: 12/20/18  8:20 PM  Result Value Ref Range   Sodium 135 135 - 145 mmol/L   Potassium 7.3 (HH) 3.5 - 5.1 mmol/L   Chloride 104 98 - 111 mmol/L   BUN 18 6 - 20 mg/dL   Creatinine, Ser 1.10 0.61 - 1.24 mg/dL   Glucose, Bld 91 70 - 99 mg/dL   Calcium, Ion 1.03 (L) 1.15 - 1.40 mmol/L   TCO2 26 22 - 32 mmol/L   Hemoglobin 17.3 (H) 13.0 - 17.0 g/dL   HCT 51.0 39.0 - 52.0 %   Ct Head Wo Contrast  Result Date: 12/20/2018 CLINICAL DATA:  Right arm numbness since 12 p.m. today. EXAM: CT HEAD WITHOUT CONTRAST TECHNIQUE: Contiguous axial images were obtained from the base of the skull through the vertex without intravenous contrast. COMPARISON:  The report and 1st image from the examination dated 03/15/2018. FINDINGS: Brain: Old bilateral basal ganglia region lacunar infarcts. Normal size and position of the ventricles. No intracranial hemorrhage, mass lesion or CT evidence of acute infarction. Vascular: No hyperdense vessel or unexpected calcification. Skull: Normal. Negative for fracture or focal lesion. Sinuses/Orbits: Retained secretions in the right frontal sinus.  Unremarkable orbits. Other: None. IMPRESSION: 1. No acute abnormality. 2. Old bilateral basal ganglia region lacunar infarcts. Electronically Signed  By: Beckie SaltsSteven  Reid M.D.   On: 12/20/2018 21:08    Pending Labs Unresulted Labs (From admission, onward)    Start     Ordered   12/21/18 0042  Potassium  ONCE - STAT,   STAT     12/21/18 0041   12/21/18 0007  SARS CORONAVIRUS 2 Nasal Swab Aptima Multi Swab  (Asymptomatic/Tier 2)  Once,   STAT    Question Answer Comment  Is this test for diagnosis or screening Screening   Symptomatic for COVID-19 as defined by CDC No   Hospitalized for COVID-19 No   Admitted to ICU for COVID-19 No   Previously tested for COVID-19 No   Resident in a congregate (group) care setting No   Employed in healthcare setting No      12/21/18 0006   Signed and Held  Hemoglobin A1c  Tomorrow morning,   R     Signed and Held   Signed and Held  Lipid panel  Tomorrow morning,   R    Comments: Fasting    Signed and Held   Signed and Held  Rapid urine drug screen (hospital performed)  Once,   R     Signed and Held          Vitals/Pain Today's Vitals   12/20/18 2345 12/21/18 0000 12/21/18 0015 12/21/18 0030  BP: (!) 147/96 (!) 177/93 140/85 133/86  Pulse: 60 66 63 (!) 57  Resp: 10 (!) 21 20 17   Temp:      TempSrc:      SpO2: 99% 100% 100% 100%  Weight:      Height:      PainSc:        Isolation Precautions No active isolations  Medications Medications  sodium chloride flush (NS) 0.9 % injection 3 mL (has no administration in time range)    Mobility non-ambulatory Moderate fall risk   Focused Assessments Neuro Assessment Handoff:  Swallow screen pass? Yes    NIH Stroke Scale ( + Modified Stroke Scale Criteria)  Interval: Initial Level of Consciousness (1a.)   : Alert, keenly responsive LOC Questions (1b. )   +: Answers both questions correctly LOC Commands (1c. )   + : Performs both tasks correctly Best Gaze (2. )  +: Normal Visual (3. )   +: No visual loss Facial Palsy (4. )    : Normal symmetrical movements Motor Arm, Left (5a. )   +: No drift Motor Arm, Right (5b. )   +: No drift Motor Leg, Left (6a. )   +: No drift Motor Leg, Right (6b. )   +: No drift Limb Ataxia (7. ): Absent Sensory (8. )   +: Normal, no sensory loss Best Language (9. )   +: No aphasia Dysarthria (10. ): Normal Extinction/Inattention (11.)   +: No Abnormality Modified SS Total  +: 0 Complete NIHSS TOTAL: 0     Neuro Assessment: Exceptions to WDL Neuro Checks:   Initial (12/20/18 2339)  Last Documented NIHSS Modified Score: 0 (12/20/18 2339) Has TPA been given? No If patient is a Neuro Trauma and patient is going to OR before floor call report to 4N Charge nurse: [phone] or [phone]     R Recommendations: See Admitting Provider Note  Report given to:   Additional Notes:

## 2018-12-21 NOTE — H&P (Addendum)
History and Physical    Eric Patterson XBJ:[id number] DOB: 03-Oct-1965 DOA: 12/20/2018  Referring MD/NP/PA:   PCP: Emelia Loron, NP   Patient coming from:  The patient is coming from home.  At baseline, pt is independent for most of ADL.        Chief Complaint: right arm numbness and heaviness  HPI: Eric Patterson is a 53 y.o. male with medical history significant of hypertension, hyperlipidemia, bilateral basal ganglia lacunar stroke, tobacco abuse, who presents with right arm numbness and heaviness.  Patient states that he was last known normal at about 230, then he had a nap, woke up at about 4 PM when he started having right arm numbness and heaviness.  No difficulty speaking, slurred speech, vision change or hearing loss.  Patient does not have chest pain, shortness breath, cough, fever or chills.  No nausea vomiting, diarrhea, abdominal pain, symptoms of UTI. Of note, patient states that he had right foot injury by worsening work in place on 7/23. That time, he had X-ray of right foot which showed no acute osseous abnormality. He continues to have pain. He followed up with Dr. Percell Miller and had CT scan today. Not sure about the findings.  ED Course: pt was found to have WBC 10.7, INR 1.1, PTT 28, pending COVID-19 test, renal function normal, potassium 7.3 on BMP at 20:20 (patient had potassium 3.9 on BMP at 18:36), no T wave peaking on EKG. CT head is negative for acute intracranial abnormalities, but showed old lacunar infarction.  Patient is placed on telemetry bed for observation.  Dr. Lorraine Lax of neurology was consulted  Review of Systems:   General: no fevers, chills, no body weight gain, fatigue HEENT: no blurry vision, hearing changes or sore throat Respiratory: no dyspnea, coughing, wheezing CV: no chest pain, no palpitations GI: no nausea, vomiting, abdominal pain, diarrhea, constipation GU: no dysuria, burning on urination, increased urinary frequency, hematuria  Ext: no leg edema  Neuro: has right arm numbness and heaviness.  No vision change or hearing loss Skin: no rash, no skin tear. MSK: has right foot injury with pain Heme: No easy bruising.  Travel history: No recent long distant travel.  Allergy: No Known Allergies  Past Medical History:  Diagnosis Date  . Foot injury 12/07/2018  . Hyperlipidemia   . Hypertension   . Stroke [facility]) 2017   R side weakness deficits    Past Surgical History:  Procedure Laterality Date  . LOOP RECORDER INSERTION N/A 03/17/2018   Procedure: LOOP RECORDER INSERTION;  Surgeon: Constance Haw, MD;  Location: [facility] CV LAB;  Service: Cardiovascular;  Laterality: N/A;  . TEE WITHOUT CARDIOVERSION N/A 03/17/2018   Procedure: TRANSESOPHAGEAL ECHOCARDIOGRAM (TEE);  Surgeon: Josue Hector, MD;  Location: [facility] ENDOSCOPY;  Service: Cardiovascular;  Laterality: N/A;    Social History:  reports that he has been smoking cigarettes. He has been smoking about 0.25 packs per day. He has never used smokeless tobacco. He reports previous alcohol use. He reports that he does not use drugs.  Family History:  Family History  Problem Relation Age of Onset  . Heart attack Mother   . Stroke Mother   . Stroke Brother 43  . Stroke Other      Prior to Admission medications   Medication Sig Start Date End Date Taking? Authorizing Provider  amLODipine (NORVASC) 10 MG tablet Take 1 tablet (10 mg total) by mouth daily. 03/17/18   Domenic Polite, MD  atorvastatin (LIPITOR) 40 MG tablet  Take 1 tablet (40 mg total) by mouth daily at 6 PM. 03/17/18   Zannie CoveJoseph, Preetha, MD  clopidogrel (PLAVIX) 75 MG tablet Take 1 tablet (75 mg total) by mouth daily. 03/18/18   Zannie CoveJoseph, Preetha, MD  oxyCODONE-acetaminophen (PERCOCET/ROXICET) 5-325 MG tablet Take 1 tablet by mouth every 4 (four) hours as needed. 12/07/18   Triplett, Tammy, PA-C  telmisartan-hydrochlorothiazide (MICARDIS HCT) 80-25 MG tablet Take 1 tablet by mouth daily. 01/09/18   Janace ArisBast, Traci A, NP     Physical Exam: Vitals:   12/21/18 0130 12/21/18 0200 12/21/18 0253 12/21/18 0443  BP: (!) 139/96 (!) 150/101 (!) 164/108 (!) 141/80  Pulse: (!) 55  67 (!) 55  Resp: 17 19 18 18   Temp:   97.8 F (36.6 C) 98 F (36.7 C)  TempSrc:   Axillary Oral  SpO2: 98%  98% 98%  Weight:   72.9 kg   Height:   5\' 8"  (1.727 m)    General: Not in acute distress HEENT:       Eyes: PERRL, EOMI, no scleral icterus.       ENT: No discharge from the ears and nose, no pharynx injection, no tonsillar enlargement.        Neck: No JVD, no bruit, no mass felt. Heme: No neck lymph node enlargement. Cardiac: S1/S2, RRR, No murmurs, No gallops or rubs. Respiratory:  No rales, wheezing, rhonchi or rubs. GI: Soft, nondistended, nontender, no rebound pain, no organomegaly, BS present. GU: No hematuria Ext: No pitting leg edema bilaterally. 2+DP/PT pulse bilaterally. Musculoskeletal: right foot is wrapped up and has tenderness. Skin: No rashes.  Neuro: Alert, oriented X3, cranial nerves II-XII grossly intact, moves all extremities normally. Muscle strength 4/5 in right arm and 5/5 in other extremities, sensation to light touch is decreased slightly in right arm. Brachial reflex 2+ bilaterally. Knee reflex 1+ bilaterally. Negative Babinski's sign. Normal finger to nose test. Psych: Patient is not psychotic, no suicidal or hemocidal ideation.  Labs on Admission: I have personally reviewed following labs and imaging studies  CBC: Recent Labs  Lab 12/20/18 1836 12/20/18 2020  WBC 10.7*  --   NEUTROABS 7.3  --   HGB 16.3 17.3*  HCT 48.3 51.0  MCV 90.8  --   PLT 450*  --    Basic Metabolic Panel: Recent Labs  Lab 12/20/18 1836 12/20/18 2020 12/21/18 0054  NA 135 135  --   K 3.9 7.3* 4.0  CL 102 104  --   CO2 24  --   --   GLUCOSE 106* 91  --   BUN 11 18  --   CREATININE 1.14 1.10  --   CALCIUM 8.8*  --   --    GFR: Estimated Creatinine Clearance: 76 mL/min (by C-G formula based on SCr of 1.1  mg/dL). Liver Function Tests: Recent Labs  Lab 12/20/18 1836  AST 17  ALT 32  ALKPHOS 93  BILITOT 0.6  PROT 7.3  ALBUMIN 3.8   No results for input(s): LIPASE, AMYLASE in the last 168 hours. No results for input(s): AMMONIA in the last 168 hours. Coagulation Profile: Recent Labs  Lab 12/20/18 1836  INR 1.1   Cardiac Enzymes: No results for input(s): CKTOTAL, CKMB, CKMBINDEX, TROPONINI in the last 168 hours. BNP (last 3 results) No results for input(s): PROBNP in the last 8760 hours. HbA1C: Recent Labs    12/21/18 0412  HGBA1C 5.5   CBG: No results for input(s): GLUCAP in the last 168 hours. Lipid  Profile: Recent Labs    12/21/18 0412  CHOL 114  HDL 30*  LDLCALC 66  TRIG 88  CHOLHDL 3.8   Thyroid Function Tests: No results for input(s): TSH, T4TOTAL, FREET4, T3FREE, THYROIDAB in the last 72 hours. Anemia Panel: No results for input(s): VITAMINB12, FOLATE, FERRITIN, TIBC, IRON, RETICCTPCT in the last 72 hours. Urine analysis:    Component Value Date/Time   LABSPEC 1.010 02/10/2015 2105   PHURINE 6.0 02/10/2015 2105   GLUCOSEU NEGATIVE 02/10/2015 2105   HGBUR TRACE (A) 02/10/2015 2105   BILIRUBINUR NEGATIVE 02/10/2015 2105   KETONESUR NEGATIVE 02/10/2015 2105   PROTEINUR NEGATIVE 02/10/2015 2105   UROBILINOGEN 1.0 02/10/2015 2105   NITRITE NEGATIVE 02/10/2015 2105   LEUKOCYTESUR NEGATIVE 02/10/2015 2105   Sepsis Labs: @LABRCNTIP (procalcitonin:4,lacticidven:4) ) Recent Results (from the past 240 hour(s))  SARS CORONAVIRUS 2 Nasal Swab Aptima Multi Swab     Status: None   Collection Time: 12/21/18 12:35 AM   Specimen: Aptima Multi Swab; Nasal Swab  Result Value Ref Range Status   SARS Coronavirus 2 NEGATIVE NEGATIVE Final    Comment: (NOTE) SARS-CoV-2 target nucleic acids are NOT DETECTED. The SARS-CoV-2 RNA is generally detectable in upper and lower respiratory specimens during the acute phase of infection. Negative results do not preclude  SARS-CoV-2 infection, do not rule out co-infections with other pathogens, and should not be used as the sole basis for treatment or other patient management decisions. Negative results must be combined with clinical observations, patient history, and epidemiological information. The expected result is Negative. Fact Sheet for Patients: HairSlick.nohttps://www.fda.gov/media/138098/download Fact Sheet for Healthcare Providers: quierodirigir.comhttps://www.fda.gov/media/138095/download This test is not yet approved or cleared by the [place] FDA and  has been authorized for detection and/or diagnosis of SARS-CoV-2 by FDA under an Emergency Use Authorization (EUA). This EUA will remain  in effect (meaning this test can be used) for the duration of the COVID-19 declaration under Section 56 4(b)(1) of the Act, 21 U.S.C. section 360bbb-3(b)(1), unless the authorization is terminated or revoked sooner. Performed at [facility], [address] [place], [place] [address]      Radiological Exams on Admission: Ct Head Wo Contrast  Result Date: 12/20/2018 CLINICAL DATA:  Right arm numbness since 12 p.m. today. EXAM: CT HEAD WITHOUT CONTRAST TECHNIQUE: Contiguous axial images were obtained from the base of the skull through the vertex without intravenous contrast. COMPARISON:  The report and 1st image from the examination dated 03/15/2018. FINDINGS: Brain: Old bilateral basal ganglia region lacunar infarcts. Normal size and position of the ventricles. No intracranial hemorrhage, mass lesion or CT evidence of acute infarction. Vascular: No hyperdense vessel or unexpected calcification. Skull: Normal. Negative for fracture or focal lesion. Sinuses/Orbits: Retained secretions in the right frontal sinus. Unremarkable orbits. Other: None. IMPRESSION: 1. No acute abnormality. 2. Old bilateral basal ganglia region lacunar infarcts. Electronically Signed   By: Beckie SaltsSteven  Reid M.D.   On: 12/20/2018 21:08     EKG: Independently  reviewed.  Sinus rhythm, QTC 411, nonspecific T wave change, no T wave peaking.   Assessment/Plan Principal Problem:   Stroke [facility](HCC) Active Problems:   Hypertension   Hyperlipidemia LDL goal <70   Tobacco use   Hyperkalemia   Possible Stroke [facility](HCC): Patient's right arm numbness and heaviness is possible due to stroke. CT-head is negative for acute intracranial abnormalities, but showed old bilateral basal ganglia region lacunar infarcts. Dr. Wilford CornerArora of neuro was consulted by EDP.  - will place on tele bed for obs -  will follow up Neurology's Recs.  - MRI/MRA was ordered by EDP - Check carotid dopplers  - Continue Plavix --> will f/u neurologist's recommendation - fasting lipid panel and HbA1c  - 2D transthoracic echocardiography  - swallowing screen. If fails, will get SLP - Check UDS  - PT/OT consult  Hypertension: - will hold oral Bp meds to allow permissive HTN in the setting of acute stroke -IV hydralazine as needed for SBP>220 or dBP>120  Hyperlipidemia LDL goal <70 -on Lipitor  Tobacco abuse: -Did counseling about importance of quitting smoking -Nicotine patch  Hyperkalemia?: potassium 7.3 on BMP at 20:20, but patient had potassium 3.9 on BMP at 18:36. No T wave change on EKG.  Renal function normal, likely due to lab error. -Get stat potassium level now --> K 4.0   Right foot injury: Of note, patient states that he had right foot injury by worsening work in place on 7/23. That time, he had X-ray of right foot which showed no acute osseous abnormality. He continues to have pain. He followed up with Dr. Eulah PontMurphy and had CT scan today. Not sure about the findings. -pain control: prn percocet -f/u with Dr. Eulah PontMurphy of ortho    DVT ppx: sq Lovenox Code Status: Full code Family Communication: None at bed side. Disposition Plan:  Anticipate discharge back to previous home environment Consults called:  Dr. Laurence SlateAroor of neuro Admission status: Obs / tele   Date of Service  12/21/2018    Lorretta HarpXilin Jimmi [facility]   If 7PM-7AM, please contact night-coverage [email or web address] Password [email or web address] 12/21/2018, 6:58 AM

## 2018-12-21 NOTE — Consult Note (Signed)
Requesting Physician: Dr. Preston FleetingGlick    Chief Complaint: Right side weakness  History obtained from: Patient and Chart    HPI:                                                                                                                                       Eric Patterson is a 53 y.o. male with past medical history significant for CVA, hypertension, hyperlipidemia, tobacco abuse who presented to the emergency department at [facility] with sudden onset right arm numbness and weakness.  Last known normal was 2:30 PM today, patient went to take a nap.  When he woke up at 4 PM he noticed that his right arm felt heavier than before as well his as numbness.  Patient was noted to have a pronator drift by EDP.  Blood pressure was elevated at 183/98 mmHg.  Patient was admitted to medicine service and neurology was consulted.  Patient does have a history of large lacunar basal ganglia infarct in October 2019 which resulted in right-sided weakness.  However, his symptoms had significantly improved and during his last follow-up visit had 5 out of 5 strength.  He sees Dr. Pearlean BrownieSethi for his outpatient follow-up.  Etiology for stroke was thought to be cryptogenic and patient underwent a loop recorder insertion.  He also had mild to moderate intracranial atherosclerotic changes, most prominent was a left M2 segment stenosis.  Other work-up included a TEE, which was negative.  LDL which was slightly elevated.  A1c was normal.  He has a remote history of syphilis but was treated for that.  Patient is on Plavix for secondary stroke prevention.  Date last known well: 8.5.20 Time last known well: 2:30 PM tPA Given: No, outside TPA window at time of presentation NIHSS: 2 Baseline MRS 0-1    Past Medical History:  Diagnosis Date  . Foot injury 12/07/2018  . Hyperlipidemia   . Hypertension   . Stroke [facility](HCC) 2017   R side weakness deficits    Past Surgical History:  Procedure Laterality Date  . LOOP RECORDER INSERTION  N/A 03/17/2018   Procedure: LOOP RECORDER INSERTION;  Surgeon: Regan Lemmingamnitz, Will Martin, MD;  [facility]: [facility] IN[facility] CV LAB;  Service: Cardiovascular;  Laterality: N/A;  . TEE WITHOUT CARDIOVERSION N/A 03/17/2018   Procedure: TRANSESOPHAGEAL ECHOCARDIOGRAM (TEE);  Surgeon: Wendall StadeNishan, Peter C, MD;  [facility];  Service: Cardiovascular;  Laterality: N/A;    Family History  Problem Relation Age of Onset  . Heart attack Mother   . Stroke Mother   . Stroke Brother [id number]  . Stroke Other    Social History:  reports that he has been smoking cigarettes. He has been smoking about 0.25 packs per day. He has never used smokeless tobacco. He reports previous alcohol use. He reports that he does not use drugs.  Allergies: No Known Allergies  Medications:  I reviewed home medications   ROS:                                                                                                                                     14 systems reviewed and negative except above    Examination:                                                                                                      General: Appears well-developed  Psych: Affect appropriate to situation Eyes: No scleral injection HENT: No OP obstrucion Head: Normocephalic.  Cardiovascular: Normal rate and regular rhythm.  Respiratory: Effort normal and breath sounds normal to anterior ascultation GI: Soft.  No distension. There is no tenderness.  Skin: WDI    Neurological Examination Mental Status: Alert, oriented, thought content appropriate.  Speech fluent without evidence of aphasia. Able to follow 3 step commands without difficulty. Cranial Nerves: II: Visual fields grossly normal,  III,IV, VI: ptosis not present, extra-ocular motions intact bilaterally, pupils equal, round, reactive to light and accommodation V,VII:  smile symmetric, facial light touch sensation normal bilaterally VIII: hearing normal bilaterally IX,X: uvula rises symmetrically XI: bilateral shoulder shrug XII: midline tongue extension Motor: Right : Upper extremity   4/5    Left:     Upper extremity   5/5  Lower extremity   5/5     Lower extremity   5/5 Tone and bulk:normal tone throughout; no atrophy noted Sensory: Pinprick and light touch intact throughout, bilaterally Deep Tendon Reflexes: 3+ b/l patella, no clonus Plantars: Right: downgoing   Left: downgoing Cerebellar: no gross ataxia      Lab Results: Basic Metabolic Panel: Recent Labs  Lab 12/20/18 1836 12/20/18 2020 12/21/18 0054  NA 135 135  --   K 3.9 7.3* 4.0  CL 102 104  --   CO2 24  --   --   GLUCOSE 106* 91  --   BUN 11 18  --   CREATININE 1.14 1.10  --   CALCIUM 8.8*  --   --     CBC: Recent Labs  Lab 12/20/18 1836 12/20/18 2020  WBC 10.7*  --   NEUTROABS 7.3  --   HGB 16.3 17.3*  HCT 48.3 51.0  MCV 90.8  --   PLT 450*  --     Coagulation Studies: Recent Labs    12/20/18 1836  LABPROT 14.0  INR 1.1    Imaging: Ct Head Wo Contrast  Result Date:  12/20/2018 CLINICAL DATA:  Right arm numbness since 12 p.m. today. EXAM: CT HEAD WITHOUT CONTRAST TECHNIQUE: Contiguous axial images were obtained from the base of the skull through the vertex without intravenous contrast. COMPARISON:  The report and 1st image from the examination dated 03/15/2018. FINDINGS: Brain: Old bilateral basal ganglia region lacunar infarcts. Normal size and position of the ventricles. No intracranial hemorrhage, mass lesion or CT evidence of acute infarction. Vascular: No hyperdense vessel or unexpected calcification. Skull: Normal. Negative for fracture or focal lesion. Sinuses/Orbits: Retained secretions in the right frontal sinus. Unremarkable orbits. Other: None. IMPRESSION: 1. No acute abnormality. 2. Old bilateral basal ganglia region lacunar infarcts. Electronically  Signed   By: Beckie SaltsSteven  Reid M.D.   On: 12/20/2018 21:08     ASSESSMENT AND PLAN  53 y.o. male with past medical history significant for CVA, hypertension, hyperlipidemia, tobacco abuse who presented to the emergency department at [facility] with sudden onset right arm numbness and weakness.   Acute ischemic stroke  Risk factors: Hypertension, hyperlipidemia, tobacco abuse, prior CVA, intracranial atherosclerosis Etiology: Needs to be evaluated  Recommend # MRI of the brain without contrast #MRA Head and neck  #Transthoracic Echo  # Start patient on ASA 81 mg and Plavix 75 mg daily #Start or continue Atorvastatin 80 mg/other high intensity statin # BP goal: permissive HTN upto 220/120 mmHg ( 185/110 if patient has CHF, CKD) # HBAIC and Lipid profile # Telemetry monitoring # Frequent neuro checks # NPO until passes stroke swallow screen  Please page stroke NP  Or  PA  Or MD from 8am -4 pm  as this patient from this time will be  followed by the stroke.   You can look them up on [email or web address]  Password [email or web address]   Embry Huss [facility] Pager Number [phone]

## 2018-12-21 NOTE — Evaluation (Signed)
Occupational Therapy Evaluation Patient Details Name: Eric Patterson MRN: [id number] DOB: 09/29/1965 Today's Date: 12/21/2018    History of Present Illness 53 y.o. male with past medical history significant for CVA, hypertension, hyperlipidemia, tobacco abuse who presented to the emergency department at [facility] with sudden onset right arm numbness and weakness.   Clinical Impression   Pt PTA: living with spouse, recent work-related injury on RLE with pt reporting NWB on RLE. Pt limited by pain and generalized weakness. R sided weakness noted. Pt could be safe to go home with w/c and HHOT for strength and ADL retraining for safety. Pt would benefit from continued OT skilled services for ADL, mobility and safety in HHOT setting. OT following acutely.     Follow Up Recommendations  Home health OT;Supervision/Assistance - 24 hour    Equipment Recommendations  Tub/shower seat;Wheelchair (measurements OT);Wheelchair cushion (measurements OT)(adult w/c)    Recommendations for Other Services       Precautions / Restrictions Precautions Precautions: Fall Required Braces or Orthoses: Splint/Cast Splint/Cast: Rt LE Restrictions Other Position/Activity Restrictions: Pt reports he is NWB on Rt LE due to foot injury      Mobility Bed Mobility Overal bed mobility: Independent             General bed mobility comments: Increased time for RLE movement, as cast is heavy.  Transfers Overall transfer level: Needs assistance Equipment used: Rolling walker (2 wheeled) Transfers: Sit to/from Stand Sit to Stand: Min guard         General transfer comment: pt able to stand only 3-5 seconds due to Rt LE pain    Balance Overall balance assessment: Needs assistance           Standing balance-Leahy Scale: Poor Standing balance comment: requires RW                           ADL either performed or assessed with clinical judgement   ADL Overall ADL's : Needs  assistance/impaired Eating/Feeding: Modified independent;Sitting   Grooming: Supervision/safety;Standing;Cueing for safety;Cueing for sequencing   Upper Body Bathing: Modified independent;Sitting   Lower Body Bathing: Min guard;Sitting/lateral leans;Sit to/from stand   Upper Body Dressing : Modified independent;Sitting   Lower Body Dressing: Min guard;Cueing for safety;Cueing for sequencing;Sitting/lateral leans;Sit to/from stand   Toilet Transfer: Min guard;Squat-pivot;BSC   Toileting- ArchitectClothing Manipulation and Hygiene: Min guard;Sitting/lateral lean;Sit to/from stand;Cueing for safety;Cueing for sequencing       Functional mobility during ADLs: Minimal assistance;Rolling walker General ADL Comments: Pt safest in w/c for mobility as long as RLE is NWB. modified independence for ADL in sitting, minguardA if standing     Vision Baseline Vision/History: No visual deficits Vision Assessment?: No apparent visual deficits     Perception     Praxis      Pertinent Vitals/Pain Pain Assessment: 0-10 Pain Score: 4  Pain Location: R ankle Pain Descriptors / Indicators: Discomfort Pain Intervention(s): Limited activity within patient's tolerance     Hand Dominance Right   Extremity/Trunk Assessment Upper Extremity Assessment Upper Extremity Assessment: Generalized weakness RUE Deficits / Details: 3-/5 RUE Coordination: decreased fine motor;decreased gross motor   Lower Extremity Assessment Lower Extremity Assessment: Generalized weakness;RLE deficits/detail RLE Deficits / Details: Rt ankle in soft dressing   Cervical / Trunk Assessment Cervical / Trunk Assessment: Normal   Communication Communication Communication: No difficulties   Cognition Arousal/Alertness: Awake/alert Behavior During Therapy: WFL for tasks assessed/performed Overall Cognitive Status: Within Functional Limits for  tasks assessed                                     General Comments        Exercises     Shoulder Instructions      Home Living Family/patient expects to be discharged to:: Private residence Living Arrangements: Spouse/significant other Available Help at Discharge: Family;Available 24 hours/day Type of Home: Apartment Home Access: Level entry     Home Layout: One level     Bathroom Shower/Tub: Teacher, early years/pre: Standard Bathroom Accessibility: Yes How Accessible: Accessible via wheelchair Home Equipment: Bedside commode;Walker - 4 wheels      Lives With: Spouse    Prior Functioning/Environment Level of Independence: Independent with assistive device(s)        Comments: pt reports he was "hopping" using rollator at home        OT Problem List: Decreased strength;Impaired balance (sitting and/or standing);Decreased activity tolerance;Decreased safety awareness;Pain      OT Treatment/Interventions: Self-care/ADL training;Therapeutic exercise;Neuromuscular education;Energy conservation;Therapeutic activities;Patient/family education;Balance training    OT Goals(Current goals can be found in the care plan section) Acute Rehab OT Goals Patient Stated Goal: be able to get around OT Goal Formulation: With patient Time For Goal Achievement: 01/04/19 Potential to Achieve Goals: Good ADL Goals Pt Will Perform Grooming: with set-up;standing Pt Will Perform Lower Body Dressing: with set-up;sitting/lateral leans;sit to/from stand Pt Will Perform Toileting - Clothing Manipulation and hygiene: with set-up;sitting/lateral leans;sit to/from stand Additional ADL Goal #1: Pt will increase to modified independence for standing at sink x3 mins for UB ADL.  OT Frequency: Min 2X/week   Barriers to D/C:            Co-evaluation PT/OT/SLP Co-Evaluation/Treatment: Yes Reason for Co-Treatment: Complexity of the patient's impairments (multi-system involvement);To address functional/ADL transfers   OT goals addressed during session:  ADL's and self-care      AM-PAC OT "6 Clicks" Daily Activity     Outcome Measure Help from another person eating meals?: None Help from another person taking care of personal grooming?: None Help from another person toileting, which includes using toliet, bedpan, or urinal?: A Little Help from another person bathing (including washing, rinsing, drying)?: A Little Help from another person to put on and taking off regular upper body clothing?: None Help from another person to put on and taking off regular lower body clothing?: A Little 6 Click Score: 21   End of Session Equipment Utilized During Treatment: Gait belt;Rolling walker Nurse Communication: Mobility status  Activity Tolerance: Patient tolerated treatment well Patient left: in bed;with call bell/phone within reach;with bed alarm set  OT Visit Diagnosis: Unsteadiness on feet (R26.81);Muscle weakness (generalized) (M62.81)                Time: 1191-4782 OT Time Calculation (min): 16 min Charges:  OT General Charges $OT Visit: 1 Visit OT Evaluation $OT Eval Moderate Complexity: 1 Mod  Darryl Nestle) Marsa Aris OTR/L Acute Rehabilitation Services Pager: [phone] Office: [place] 12/21/2018, 2:40 PM

## 2018-12-21 NOTE — TOC Initial Note (Signed)
Transition of Care [facility]) - Initial/Assessment Note    Patient Details  Name: Eric Patterson MRN: [id number] Date of Birth: [birth year]  [facility]) CM/SW Contact:    Pollie Friar, RN Phone Number: 12/21/2018, 7:23 PM  Clinical Narrative:                   Expected Discharge Plan: [place] Barriers to Discharge: Continued Medical Work up   Patient Goals and CMS Choice        Expected Discharge Plan and Services Expected Discharge Plan: [place]   Discharge Planning Services: CM Consult Post Acute Care Choice: Home Health, Durable Medical Equipment   Expected Discharge Date: 12/21/18               DME Arranged: Wheelchair manual DME Agency: [employer] Date DME Agency Contacted: 12/21/18   Representative spoke with at DME Agency: [place]: PT, OT          Prior Living Arrangements/Services   Lives with:: Spouse Patient language and need for interpreter reviewed:: Yes(no needs) Do you feel safe going back to the place where you live?: Yes      Need for Family Participation in Patient Care: Yes (Comment) Care giver support system in place?: Yes (comment)(wife is able to provide needed supervision and assistance)   Criminal Activity/Legal Involvement Pertinent to Current Situation/Hospitalization: No - Comment as needed  Activities of Daily Living Home Assistive Devices/Equipment: Gilford Rile (specify type) ADL Screening (condition at time of admission) Patient's cognitive ability adequate to safely complete daily activities?: Yes Is the patient deaf or have difficulty hearing?: No Does the patient have difficulty seeing, even when wearing glasses/contacts?: No Does the patient have difficulty concentrating, remembering, or making decisions?: No Patient able to express need for assistance with ADLs?: No Does the patient have difficulty dressing or bathing?: No Independently performs ADLs?: Yes (appropriate for  developmental age) Does the patient have difficulty walking or climbing stairs?: Yes Weakness of Legs: Right Weakness of Arms/Hands: None  Permission Sought/Granted                  Emotional Assessment Appearance:: Appears stated age Attitude/Demeanor/Rapport: Engaged Affect (typically observed): Accepting, Pleasant Orientation: : Oriented to Self, Oriented to Place, Oriented to  Time, Oriented to Situation   Psych Involvement: No (comment)  Admission diagnosis:  Elevated blood pressure reading with diagnosis of hypertension [I10] Cerebrovascular accident (CVA), unspecified mechanism ([place]) [I63.9] Patient Active Problem List   Diagnosis Date Noted  . Hyperkalemia 12/21/2018  . CVA (cerebral vascular accident) ([place]) 12/21/2018  . Elevated blood pressure reading with diagnosis of hypertension   . Tobacco use 03/16/2018  . Stroke ([place]) 03/16/2018  . Infarction of left basal ganglia ([place]) 03/15/2018  . Hyperlipidemia LDL goal <70 10/21/2016  . Chronic left-sided low back pain without sciatica 10/21/2016  . Hypertension 11/28/2015  . Lacunar infarction ([place]) 11/28/2015   PCP:  Emelia Loron, NP Pharmacy:   [facility] [place], [address] [place] [address] Phone: [phone] Fax: [phone]     Social Determinants of Health (SDOH) Interventions    Readmission Risk Interventions No flowsheet data found.

## 2018-12-21 NOTE — Progress Notes (Signed)
  Echocardiogram 2D Echocardiogram has been performed.  Eric Patterson 12/21/2018, 10:18 AM

## 2018-12-21 NOTE — Evaluation (Signed)
Speech Language Pathology Evaluation Patient Details Name: Eric Patterson MRN: [id number] DOB: 01/22/1966 Today's Date: 12/21/2018 Time: [id number] SLP Time Calculation (min) (ACUTE ONLY): 24 min  Problem List:  Patient Active Problem List   Diagnosis Date Noted  . Hyperkalemia 12/21/2018  . Tobacco use 03/16/2018  . Stroke (HCC) 03/16/2018  . Infarction of left basal ganglia (HCC) 03/15/2018  . Hyperlipidemia LDL goal <70 10/21/2016  . Chronic left-sided low back pain without sciatica 10/21/2016  . Hypertension 11/28/2015  . Lacunar infarction (HCC) 11/28/2015   Past Medical History:  Past Medical History:  Diagnosis Date  . Foot injury 12/07/2018  . Hyperlipidemia   . Hypertension   . Stroke [facility](HCC) 2017   R side weakness deficits   Past Surgical History:  Past Surgical History:  Procedure Laterality Date  . LOOP RECORDER INSERTION N/A 03/17/2018   Procedure: LOOP RECORDER INSERTION;  Surgeon: Regan Lemmingamnitz, Will Martin, MD;  [facility]: [facility] INVASIVE CV LAB;  Service: Cardiovascular;  Laterality: N/A;  . TEE WITHOUT CARDIOVERSION N/A 03/17/2018   Procedure: TRANSESOPHAGEAL ECHOCARDIOGRAM (TEE);  Surgeon: Wendall StadeNishan, Peter C, MD;  [facility];  Service: Cardiovascular;  Laterality: N/A;   HPI:  Pt is a 53 y.o. male with medical history significant of hypertension, hyperlipidemia, bilateral basal ganglia lacunar stroke, tobacco abuse, who presented with right arm numbness and heaviness. CT of the head was negative for acute changes. MRI of the brain acute infarct in the left posterior MCA territory involving occipital parietal and posterior frontal lobes.   Assessment / Plan / Recommendation Clinical Impression  Pt reported that he was living independently prior to admission. He indicated that he was employed full-time doing maintainance for [employer] and stated that he has an 8th-grade education but completed his GED subsequently. He denied any baseline or new deficits in speech,  language or cognition. His speech and language skills are currently within normal limits and no overt cognitive-linguistic deficits were noted. Further skilled SLP services are not clinically indicated at this time. Pt and nursing were educated regarding results and recommendations; both parties verbalized understanding as well as agreement with plan of care.    SLP Assessment  SLP Recommendation/Assessment: Patient does not need any further Speech Lanaguage Pathology Services SLP Visit Diagnosis: Aphasia (R47.01)    Follow Up Recommendations  None    Frequency and Duration           SLP Evaluation Cognition  Overall Cognitive Status: Within Functional Limits for tasks assessed Arousal/Alertness: Awake/alert Orientation Level: Oriented X4 Attention: Focused;Sustained Focused Attention: Appears intact(Vigilance WNL: 1/1) Sustained Attention: Appears intact Memory: Appears intact(Immediate: 5/5; delayed: 4/5; with cue: 1/1) Awareness: Appears intact Problem Solving: Appears intact Executive Function: Reasoning;Organizing Reasoning: Appears intact Organizing: (Backward digit span: 0/1)       Comprehension  Auditory Comprehension Overall Auditory Comprehension: Appears within functional limits for tasks assessed Yes/No Questions: Within Functional Limits Basic Biographical Questions: (5/5) Complex Questions: (5/5) Paragraph Comprehension (via yes/no questions): (3/4) Commands: Within Functional Limits Two Step Basic Commands: (4/4) Multistep Basic Commands: (4/4) Visual Recognition/Discrimination Discrimination: Within Function Limits Reading Comprehension Reading Status: Within funtional limits    Expression Expression Primary Mode of Expression: Verbal Verbal Expression Overall Verbal Expression: Appears within functional limits for tasks assessed Initiation: No impairment Automatic Speech: Counting;Day of week;Month of year(Counting: 10/10; Days: 7/7; Months:  10/12) Level of Generative/Spontaneous Verbalization: Conversation Repetition: No impairment(5/5) Naming: No impairment Responsive: (5/5) Confrontation: Within functional limits(10/10) Convergent: (5/5) Pragmatics: No impairment Written Expression Dominant Hand: Right  Oral / Motor  Oral Motor/Sensory Function Overall Oral Motor/Sensory Function: Within functional limits Motor Speech Overall Motor Speech: Appears within functional limits for tasks assessed Respiration: Within functional limits Phonation: Normal Resonance: Within functional limits Articulation: Within functional limitis Intelligibility: Intelligible Motor Planning: Witnin functional limits Motor Speech Errors: Not applicable   Eric Muntean I. Hardin Patterson, [place], [place] Office number [phone] Pager [place] 12/21/2018, 12:14 PM

## 2018-12-21 NOTE — Progress Notes (Signed)
Pt d/c home to self care, pt's wheel chair is in the room. Pt is stable with no new concerns. Alert and oriented. No new pain. D/c instructions done with teach back. Pt verbalize understanding. Pt will be transported out of the hospital by spouse.

## 2018-12-21 NOTE — Progress Notes (Signed)
Patient suffers from a CVA and right foot injury which impairs their ability to perform daily activities like dressing in the home. A crutch will not resolve  issue with performing activities of daily living. A wheelchair will allow patient to safely perform daily activities. Patient is not able to propel themselves in the home using a standard weight wheelchair due to arm weakness. Patient can self propel in the lightweight wheelchair. Length of need 6 months .  Accessories: elevating leg rests (ELRs), wheel locks, extensions and anti-tippers.

## 2018-12-21 NOTE — Progress Notes (Signed)
OT Cancellation Note  Patient Details Name: Sumedh Shinsato MRN: [id number] DOB: [birth year]   Cancelled Treatment:    Reason Eval/Treat Not Completed: Patient at procedure or test/ unavailable(Pt at MRI this AM.)  OTR to follow-up later this AM for eval.  Darryl Nestle) Marsa Aris OTR/L Acute Rehabilitation Services Pager: [phone] Office: [place] 12/21/2018, 8:03 AM

## 2018-12-21 NOTE — Discharge Summary (Addendum)
Physician Discharge Summary  Eric Patterson ZOX:[id number] DOB: 16-Dec-1965 DOA: 12/20/2018  PCP: Carmel Sacramento, NP  Admit date: 12/20/2018 Discharge date: 12/21/2018  Admitted From: Home  Disposition: Home  Recommendations for Outpatient Follow-up:  1. Hypercoagulable work-up ordered by neurology needs to be followed  Home Health: PT    Discharge Condition: Stable CODE STATUS: Full Diet recommendation: Heart healthy Consultations:  Neurology   Discharge Diagnoses:  Principal Problem:   Stroke [facility]) Active Problems:   Hypertension   Hyperlipidemia LDL goal <70   Tobacco use   Hyperkalemia   CVA (cerebral vascular accident) (HCC)   Elevated blood pressure reading with diagnosis of hypertension    Brief Summary: Eric Patterson is a 53 y.o. male with medical history significant of hypertension, hyperlipidemia, bilateral basal ganglia lacunar stroke, tobacco abuse, who presents with right arm numbness and heaviness.  Patient states that he was last known normal at about 230, then he had a nap, woke up at about 4 PM when he started having right arm numbness and heaviness.  No difficulty speaking, slurred speech, vision change or hearing loss.  Patient does not have chest pain, shortness breath, cough, fever or chills.  No nausea vomiting, diarrhea, abdominal pain, symptoms of UTI. Of note, patient states that he had right foot injury by worsening work in place on 7/23. That time, he had X-ray of right foot which showed no acute osseous abnormality. He continues to have pain. He followed up with Dr. Eulah Pont and had CT scan today. Not sure about the findings.  He was admitted for a suspected CVA.  Hospital Course:  Acute CVA- suspected to be embolic -Initial CT head in the ED was negative - MRI/MRA head and neck: Patchy areas of acute infarct in the left posterior MCA territory involving occipital, parietal and posterior frontal lobes-see remaining full report below - 2D echo: Negative for  thrombus -The patient does have a loop recorder which was interrogated today and does not show any arrhythmias - LDL 66, HDL is 30 -Hemoglobin A1c 5.5 - A hypercoagulable work-up has been ordered as this is his second "cryptogenic" CVA-neurology plans to follow-up on this work-up when he returns for his outpatient visit -Neurology recommends aspirin 81 mg and Plavix for 3 weeks followed by Plavix alone which he was taking previously - He has had a physical therapy eval and home health PT has been ordered - His arm no longer feels numb but is slightly weak -He will follow-up with [facility] in 4 weeks  Hypertension - The patient states that he takes his medications appropriately-at this time allowing permissive hypertension - He can resume his antihypertensives tomorrow  Hyperlipidemia - LDL is 66-he can continue Lipitor  Nicotine abuse -He states he smokes about a pack a day  - I have advised him to continue to try to stop smoking prevent future CVAs and other atherosclerotic events  Right foot injury at work - He has been following as outpatient for this  Hyperkalemia - Patient's potassium in the ED was 7.3-this was rechecked and found to be normal at 4.0 and may have been a lab error    Discharge Exam: Vitals:   12/21/18 0827 12/21/18 1221  BP: (!) 158/97 (!) 178/99  Pulse: (!) 56 (!) 59  Resp: 18 18  Temp: 98.6 F (37 C) 98 F (36.7 C)  SpO2: 100% 100%   Vitals:   12/21/18 0253 12/21/18 0443 12/21/18 0827 12/21/18 1221  BP: (!) 164/108 (!) 141/80 (!) 158/97 Marland Kitchen)  178/99  Pulse: 67 (!) 55 (!) 56 (!) 59  Resp: 18 18 18 18   Temp: 97.8 F (36.6 C) 98 F (36.7 C) 98.6 F (37 C) 98 F (36.7 C)  TempSrc: Axillary Oral Oral Oral  SpO2: 98% 98% 100% 100%  Weight: 72.9 kg     Height: 5\' 8"  (1.727 m)       General: Pt is alert, awake, not in acute distress Cardiovascular: RRR, S1/S2 +, no rubs, no gallops Respiratory: CTA bilaterally, no wheezing, no  rhonchi Abdominal: Soft, NT, ND, bowel sounds + Extremities: no edema, no cyanosis Neuro: Awake alert oriented x3, cranial nerves II through XII intact-4 out of 5 weakness in right arm- remaining extremities have normal strength   Discharge Instructions  Discharge Instructions    Ambulatory referral to Neurology   Complete by: As directed    Follow up with stroke clinic NP (Jessica Vanschaick or Darrol Angelarolyn Martin, if both not available, consider Dr. Delia HeadyPramod Sethi, Dr. Jamelle RushingVikram Penumali, or Dr. Naomie DeanAntonia Ahern) [facility] in about 4 weeks.  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 Loop negative. Recommend f/u w/ Pearlean BrownieSethi.   Diet - low sodium heart healthy   Complete by: As directed    Increase activity slowly   Complete by: As directed      Allergies as of 12/21/2018   No Known Allergies     Medication List    TAKE these medications   amLODipine-benazepril 10-20 MG capsule Commonly known as: LOTREL Take 1 capsule by mouth daily.   aspirin EC 81 MG tablet Take 1 tablet (81 mg total) by mouth daily for 21 days.   atorvastatin 40 MG tablet Commonly known as: LIPITOR Take 1 tablet (40 mg total) by mouth daily at 6 PM.   clopidogrel 75 MG tablet Commonly known as: PLAVIX Take 1 tablet (75 mg total) by mouth daily.   gabapentin 300 MG capsule Commonly known as: NEURONTIN Take 300 mg by mouth 2 (two) times daily as needed (for pain).   nicotine 14 mg/24hr patch Commonly known as: Nicoderm CQ Place 1 patch (14 mg total) onto the skin daily. Start when the 21 mg patches are finished   nicotine 7 mg/24hr patch Commonly known as: Nicoderm CQ Place 1 patch (7 mg total) onto the skin daily. Start when 14 mg patches have finished   nicotine 21 mg/24hr patch Commonly known as: NICODERM CQ - dosed in mg/24 hours Place 1 patch (21 mg total) onto the skin daily. Start taking on: December 22, 2018   traMADol 50 MG tablet Commonly known as: ULTRAM Take 50  mg by mouth 3 (three) times daily as needed for moderate pain.      Follow-up Information    [facility] Follow up in 4 week(s).   Specialty: Neurology Why: stroke clinic. office will call you with appt date and time.  Contact information: [address] [place] [phone]         No Known Allergies   Procedures/Studies: 2 D ECHO 1. The left ventricle has normal systolic function with an ejection fraction of 60-65%. The cavity size was normal. There is moderately increased left ventricular wall thickness. Left ventricular diastolic Doppler parameters are consistent with impaired  relaxation.  2. The right ventricle has normal systolic function. The cavity was normal.  3. The mitral valve is grossly normal.  4. The tricuspid valve is grossly normal.  5. The aortic valve  is tricuspid. Mild thickening of the aortic valve. No stenosis of the aortic valve.  6. The aorta is abnormal in size and structure.  7. There is mild dilatation of the ascending aorta measuring 39 mm.  8. Normal LV systolic function; mild diastolic dysfunction; moderate LVH; mildly dilated ascending aorta.  Ct Head Wo Contrast  Result Date: 12/20/2018 CLINICAL DATA:  Right arm numbness since 12 p.m. today. EXAM: CT HEAD WITHOUT CONTRAST TECHNIQUE: Contiguous axial images were obtained from the base of the skull through the vertex without intravenous contrast. COMPARISON:  The report and 1st image from the examination dated 03/15/2018. FINDINGS: Brain: Old bilateral basal ganglia region lacunar infarcts. Normal size and position of the ventricles. No intracranial hemorrhage, mass lesion or CT evidence of acute infarction. Vascular: No hyperdense vessel or unexpected calcification. Skull: Normal. Negative for fracture or focal lesion. Sinuses/Orbits: Retained secretions in the right frontal sinus. Unremarkable orbits. Other: None. IMPRESSION: 1. No acute  abnormality. 2. Old bilateral basal ganglia region lacunar infarcts. Electronically Signed   By: Beckie Salts M.D.   On: 12/20/2018 21:08   Mr Angio Head Wo Contrast  Result Date: 12/21/2018 CLINICAL DATA:  Stroke.  Right arm numbness and heaviness EXAM: MRI HEAD WITHOUT  CONTRAST MRA HEAD WITHOUT CONTRAST MRA NECK WITHOUT AND WITH CONTRAST TECHNIQUE: Multiplanar, multiecho pulse sequences of the brain and surrounding structures were obtained without intravenous contrast. Angiographic images of the Circle of Willis were obtained using MRA technique without intravenous contrast. Angiographic images of the neck were obtained using MRA technique without and with intravenous contrast. Carotid stenosis measurements (when applicable) are obtained utilizing NASCET criteria, using the distal internal carotid diameter as the denominator. CONTRAST:  7 mL Gadovist IV COMPARISON:  CT head 12/20/2018 FINDINGS: MRI HEAD FINDINGS Brain: Patchy areas of acute infarction in the left lower occipital and parietal lobe extending into the left frontal parietal cortex. Small areas of acute infarct in the left parietal white matter. Negative for hemorrhage Ventricle size normal. Mild chronic ischemic changes in the white matter. Negative for mass or edema. No midline shift. Vascular: Normal arterial flow voids. Skull and upper cervical spine: Negative Sinuses/Orbits: Mucosal edema paranasal sinuses.  Normal orbit Other: None MRA HEAD FINDINGS Small vertebral arteries are widely patent. Basilar is small due to fetal origin of the posterior cerebral arteries bilaterally. Large AICA is patent bilaterally and likely supplies PICA. Superior cerebellar arteries are patent bilaterally. Posterior cerebral arteries patent. Mild atherosclerotic stenosis of the right P2 segment. Aplastic left A1 segment. Subsequent small caliber left internal carotid artery as noted previously. Atherosclerotic mild stenosis left cavernous carotid. Mild stenosis  in left MCA branches. Right internal carotid artery patent with mild stenosis in the cavernous segment. Both anterior cerebral arteries supplied from the right and appear patent. Mild stenosis and right MCA branches. No large vessel occlusion. MRA NECK FINDINGS Normal aortic arch. Antegrade flow in the carotid and vertebral arteries bilaterally. Right carotid bifurcation widely patent without significant stenosis Mild atherosclerotic disease left carotid bifurcation without significant stenosis. Apparent stenosis origin right vertebral artery however this appears widely patent on CTA. MRA findings felt to be artifactual. Left vertebral artery widely patent with origin from the aortic arch. IMPRESSION: 1. Patchy areas of acute infarct in the left posterior MCA territory involving occipital parietal and posterior frontal lobes. Negative for intracranial hemorrhage. 2. Mild intracranial atherosclerotic disease in the cavernous carotid and middle cerebral artery branches bilaterally and in the right posterior cerebral artery. No large vessel  occlusion 3. No significant carotid or vertebral artery stenosis in the neck. Electronically Signed   By: Franchot Gallo M.D.   On: 12/21/2018 10:14   Mr Angio Neck W Wo Contrast  Result Date: 12/21/2018 CLINICAL DATA:  Stroke.  Right arm numbness and heaviness EXAM: MRI HEAD WITHOUT  CONTRAST MRA HEAD WITHOUT CONTRAST MRA NECK WITHOUT AND WITH CONTRAST TECHNIQUE: Multiplanar, multiecho pulse sequences of the brain and surrounding structures were obtained without intravenous contrast. Angiographic images of the Circle of Willis were obtained using MRA technique without intravenous contrast. Angiographic images of the neck were obtained using MRA technique without and with intravenous contrast. Carotid stenosis measurements (when applicable) are obtained utilizing NASCET criteria, using the distal internal carotid diameter as the denominator. CONTRAST:  7 mL Gadovist IV  COMPARISON:  CT head 12/20/2018 FINDINGS: MRI HEAD FINDINGS Brain: Patchy areas of acute infarction in the left lower occipital and parietal lobe extending into the left frontal parietal cortex. Small areas of acute infarct in the left parietal white matter. Negative for hemorrhage Ventricle size normal. Mild chronic ischemic changes in the white matter. Negative for mass or edema. No midline shift. Vascular: Normal arterial flow voids. Skull and upper cervical spine: Negative Sinuses/Orbits: Mucosal edema paranasal sinuses.  Normal orbit Other: None MRA HEAD FINDINGS Small vertebral arteries are widely patent. Basilar is small due to fetal origin of the posterior cerebral arteries bilaterally. Large AICA is patent bilaterally and likely supplies PICA. Superior cerebellar arteries are patent bilaterally. Posterior cerebral arteries patent. Mild atherosclerotic stenosis of the right P2 segment. Aplastic left A1 segment. Subsequent small caliber left internal carotid artery as noted previously. Atherosclerotic mild stenosis left cavernous carotid. Mild stenosis in left MCA branches. Right internal carotid artery patent with mild stenosis in the cavernous segment. Both anterior cerebral arteries supplied from the right and appear patent. Mild stenosis and right MCA branches. No large vessel occlusion. MRA NECK FINDINGS Normal aortic arch. Antegrade flow in the carotid and vertebral arteries bilaterally. Right carotid bifurcation widely patent without significant stenosis Mild atherosclerotic disease left carotid bifurcation without significant stenosis. Apparent stenosis origin right vertebral artery however this appears widely patent on CTA. MRA findings felt to be artifactual. Left vertebral artery widely patent with origin from the aortic arch. IMPRESSION: 1. Patchy areas of acute infarct in the left posterior MCA territory involving occipital parietal and posterior frontal lobes. Negative for intracranial  hemorrhage. 2. Mild intracranial atherosclerotic disease in the cavernous carotid and middle cerebral artery branches bilaterally and in the right posterior cerebral artery. No large vessel occlusion 3. No significant carotid or vertebral artery stenosis in the neck. Electronically Signed   By: Franchot Gallo M.D.   On: 12/21/2018 10:14   Mr Brain Wo Contrast  Result Date: 12/21/2018 CLINICAL DATA:  Stroke.  Right arm numbness and heaviness EXAM: MRI HEAD WITHOUT  CONTRAST MRA HEAD WITHOUT CONTRAST MRA NECK WITHOUT AND WITH CONTRAST TECHNIQUE: Multiplanar, multiecho pulse sequences of the brain and surrounding structures were obtained without intravenous contrast. Angiographic images of the Circle of Willis were obtained using MRA technique without intravenous contrast. Angiographic images of the neck were obtained using MRA technique without and with intravenous contrast. Carotid stenosis measurements (when applicable) are obtained utilizing NASCET criteria, using the distal internal carotid diameter as the denominator. CONTRAST:  7 mL Gadovist IV COMPARISON:  CT head 12/20/2018 FINDINGS: MRI HEAD FINDINGS Brain: Patchy areas of acute infarction in the left lower occipital and parietal lobe extending into the left  frontal parietal cortex. Small areas of acute infarct in the left parietal white matter. Negative for hemorrhage Ventricle size normal. Mild chronic ischemic changes in the white matter. Negative for mass or edema. No midline shift. Vascular: Normal arterial flow voids. Skull and upper cervical spine: Negative Sinuses/Orbits: Mucosal edema paranasal sinuses.  Normal orbit Other: None MRA HEAD FINDINGS Small vertebral arteries are widely patent. Basilar is small due to fetal origin of the posterior cerebral arteries bilaterally. Large AICA is patent bilaterally and likely supplies PICA. Superior cerebellar arteries are patent bilaterally. Posterior cerebral arteries patent. Mild atherosclerotic stenosis  of the right P2 segment. Aplastic left A1 segment. Subsequent small caliber left internal carotid artery as noted previously. Atherosclerotic mild stenosis left cavernous carotid. Mild stenosis in left MCA branches. Right internal carotid artery patent with mild stenosis in the cavernous segment. Both anterior cerebral arteries supplied from the right and appear patent. Mild stenosis and right MCA branches. No large vessel occlusion. MRA NECK FINDINGS Normal aortic arch. Antegrade flow in the carotid and vertebral arteries bilaterally. Right carotid bifurcation widely patent without significant stenosis Mild atherosclerotic disease left carotid bifurcation without significant stenosis. Apparent stenosis origin right vertebral artery however this appears widely patent on CTA. MRA findings felt to be artifactual. Left vertebral artery widely patent with origin from the aortic arch. IMPRESSION: 1. Patchy areas of acute infarct in the left posterior MCA territory involving occipital parietal and posterior frontal lobes. Negative for intracranial hemorrhage. 2. Mild intracranial atherosclerotic disease in the cavernous carotid and middle cerebral artery branches bilaterally and in the right posterior cerebral artery. No large vessel occlusion 3. No significant carotid or vertebral artery stenosis in the neck. Electronically Signed   By: Marlan Palauharles  Clark M.D.   On: 12/21/2018 10:14   Dg Foot Complete Right  Result Date: 12/07/2018 CLINICAL DATA:  Right foot pain, swelling.  Trauma. EXAM: RIGHT FOOT COMPLETE - 3+ VIEW COMPARISON:  None. FINDINGS: There is no evidence of fracture or dislocation. 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 Small os peroneum. There is no evidence of arthropathy or other focal bone abnormality. Soft tissues are unremarkable. IMPRESSION: No acute osseous abnormality, right foot. Electronically Signed   By: Duanne GuessNicholas  Plundo M.D.   On: 12/07/2018 11:33     The results of  significant diagnostics from this hospitalization (including imaging, microbiology, ancillary and laboratory) are listed below for reference.     Microbiology: Recent Results (from the past 240 hour(s))  SARS CORONAVIRUS 2 Nasal Swab Aptima Multi Swab     Status: None   Collection Time: 12/21/18 12:35 AM   Specimen: Aptima Multi Swab; Nasal Swab  Result Value Ref Range Status   SARS Coronavirus 2 NEGATIVE NEGATIVE Final    Comment: (NOTE) SARS-CoV-2 target nucleic acids are NOT DETECTED. The SARS-CoV-2 RNA is generally detectable in upper and lower respiratory specimens during the acute phase of infection. Negative results do not preclude SARS-CoV-2 infection, do not rule out co-infections with other pathogens, and should not be used as the sole basis for treatment or other patient management decisions. Negative results must be combined with clinical observations, patient history, and epidemiological information. The expected result is Negative. Fact Sheet for Patients: HairSlick.nohttps://www.fda.gov/media/138098/download Fact Sheet for Healthcare Providers: quierodirigir.comhttps://www.fda.gov/media/138095/download This test is not yet approved or cleared by the [place] FDA and  has been authorized for detection and/or diagnosis of SARS-CoV-2 by FDA under an Emergency Use Authorization (EUA). This EUA will remain  in effect (meaning this test  can be used) for the duration of the COVID-19 declaration under Section 56 4(b)(1) of the Act, 21 U.S.C. section 360bbb-3(b)(1), unless the authorization is terminated or revoked sooner. Performed at [facility], [address] [place], [place] [address]      Labs: BNP (last 3 results) No results for input(s): BNP in the last 8760 hours. Basic Metabolic Panel: Recent Labs  Lab 12/20/18 1836 12/20/18 2020 12/21/18 0054  NA 135 135  --   K 3.9 7.3* 4.0  CL 102 104  --   CO2 24  --   --   GLUCOSE 106* 91  --   BUN 11 18  --   CREATININE 1.14  1.10  --   CALCIUM 8.8*  --   --    Liver Function Tests: Recent Labs  Lab 12/20/18 1836  AST 17  ALT 32  ALKPHOS 93  BILITOT 0.6  PROT 7.3  ALBUMIN 3.8   No results for input(s): LIPASE, AMYLASE in the last 168 hours. No results for input(s): AMMONIA in the last 168 hours. CBC: Recent Labs  Lab 12/20/18 1836 12/20/18 2020  WBC 10.7*  --   NEUTROABS 7.3  --   HGB 16.3 17.3*  HCT 48.3 51.0  MCV 90.8  --   PLT 450*  --    Cardiac Enzymes: No results for input(s): CKTOTAL, CKMB, CKMBINDEX, TROPONINI in the last 168 hours. BNP: Invalid input(s): POCBNP CBG: No results for input(s): GLUCAP in the last 168 hours. D-Dimer No results for input(s): DDIMER in the last 72 hours. Hgb A1c Recent Labs    12/21/18 0412  HGBA1C 5.5   Lipid Profile Recent Labs    12/21/18 0412  CHOL 114  HDL 30*  LDLCALC 66  TRIG 88  CHOLHDL 3.8   Thyroid function studies No results for input(s): TSH, T4TOTAL, T3FREE, THYROIDAB in the last 72 hours.  Invalid input(s): FREET3 Anemia work up No results for input(s): VITAMINB12, FOLATE, FERRITIN, TIBC, IRON, RETICCTPCT in the last 72 hours. Urinalysis    Component Value Date/Time   LABSPEC 1.010 02/10/2015 2105   PHURINE 6.0 02/10/2015 2105   GLUCOSEU NEGATIVE 02/10/2015 2105   HGBUR TRACE (A) 02/10/2015 2105   BILIRUBINUR NEGATIVE 02/10/2015 2105   KETONESUR NEGATIVE 02/10/2015 2105   PROTEINUR NEGATIVE 02/10/2015 2105   UROBILINOGEN 1.0 02/10/2015 2105   NITRITE NEGATIVE 02/10/2015 2105   LEUKOCYTESUR NEGATIVE 02/10/2015 2105   Sepsis Labs Invalid input(s): PROCALCITONIN,  WBC,  LACTICIDVEN Microbiology Recent Results (from the past 240 hour(s))  SARS CORONAVIRUS 2 Nasal Swab Aptima Multi Swab     Status: None   Collection Time: 12/21/18 12:35 AM   Specimen: Aptima Multi Swab; Nasal Swab  Result Value Ref Range Status   SARS Coronavirus 2 NEGATIVE NEGATIVE Final    Comment: (NOTE) SARS-CoV-2 target nucleic acids are NOT  DETECTED. The SARS-CoV-2 RNA is generally detectable in upper and lower respiratory specimens during the acute phase of infection. Negative results do not preclude SARS-CoV-2 infection, do not rule out co-infections with other pathogens, and should not be used as the sole basis for treatment or other patient management decisions. Negative results must be combined with clinical observations, patient history, and epidemiological information. The expected result is Negative. Fact Sheet for Patients: HairSlick.nohttps://www.fda.gov/media/138098/download Fact Sheet for Healthcare Providers: quierodirigir.comhttps://www.fda.gov/media/138095/download This test is not yet approved or cleared by the [place] FDA and  has been authorized for detection and/or diagnosis of SARS-CoV-2 by FDA under an Emergency Use  Authorization (EUA). This EUA will remain  in effect (meaning this test can be used) for the duration of the COVID-19 declaration under Section 56 4(b)(1) of the Act, 21 U.S.C. section 360bbb-3(b)(1), unless the authorization is terminated or revoked sooner. Performed at [facility], [address] [place], [place] [address]      Time coordinating discharge in minutes: 65  SIGNED:   Calvert Cantor, MD  [facility] 12/21/2018, 2:53 PM Pager   If 7PM-7AM, please contact night-coverage [email or web address] Password [email or web address]

## 2018-12-21 NOTE — Evaluation (Signed)
Physical Therapy Evaluation Patient Details Name: Eric Patterson MRN: [id number] DOB: 02/28/1966 Today's Date: 12/21/2018   History of Present Illness  53 y.o. male with past medical history significant for CVA, hypertension, hyperlipidemia, tobacco abuse who presented [facility] the emergency department [facility] with sudden onset right arm numbness and weakness.  Clinical Impression  Pt presents with decreased mobility and increased pain. Pt will benefit from acute PT services to address deficits and improve functional mobility.     Follow Up Recommendations Home health PT    Equipment Recommendations  Wheelchair (measurements PT);Wheelchair cushion (measurements PT)(18 x 18 with elevating leg rests)    Recommendations for Other Services       Precautions / Restrictions Precautions Precautions: Fall Required Braces or Orthoses: Splint/Cast Splint/Cast: Rt LE Restrictions Other Position/Activity Restrictions: Pt reports he is NWB on Rt LE due to foot injury      Mobility  Bed Mobility Overal bed mobility: Independent                Transfers Overall transfer level: Needs assistance Equipment used: Rolling walker (2 wheeled) Transfers: Sit to/from Stand Sit to Stand: Min guard         General transfer comment: pt able to stand only 3-5 seconds due to Rt LE pain  Ambulation/Gait                Stairs            Wheelchair Mobility    Modified Rankin (Stroke Patients Only)       Balance Overall balance assessment: Needs assistance           Standing balance-Leahy Scale: Poor Standing balance comment: requires RW                             Pertinent Vitals/Pain Pain Assessment: 0-10 Pain Score: 4  Pain Location: R ankle Pain Descriptors / Indicators: Discomfort Pain Intervention(s): Limited activity within patient's tolerance    Home Living Family/patient expects to be discharged to:: Private residence Living Arrangements:  Spouse/significant other Available Help at Discharge: Family;Available 24 hours/day Type of Home: Apartment Home Access: Level entry     Home Layout: One level Home Equipment: Bedside commode;Walker - 4 wheels      Prior Function Level of Independence: Independent with assistive device(s)         Comments: pt reports he was "hopping" using rollator at home     Hand Dominance   Dominant Hand: Right    Extremity/Trunk Assessment   Upper Extremity Assessment Upper Extremity Assessment: Generalized weakness RUE Deficits / Details: 3-/5    Lower Extremity Assessment Lower Extremity Assessment: Generalized weakness;RLE deficits/detail RLE Deficits / Details: Rt ankle in soft dressing    Cervical / Trunk Assessment Cervical / Trunk Assessment: Normal  Communication   Communication: No difficulties  Cognition Arousal/Alertness: Awake/alert Behavior During Therapy: WFL for tasks assessed/performed Overall Cognitive Status: Within Functional Limits for tasks assessed                                        General Comments      Exercises     Assessment/Plan    PT Assessment Patient needs continued PT services  PT Problem List Decreased strength;Decreased mobility;Decreased balance;Decreased safety awareness;Decreased knowledge of precautions       PT Treatment Interventions  DME instruction;Therapeutic exercise;Gait training;Balance training;Stair training;Neuromuscular re-education;Functional mobility training;Therapeutic activities;Patient/family education;Wheelchair mobility training    PT Goals (Current goals can be found in the Care Plan section)  Acute Rehab PT Goals Patient Stated Goal: be able to get around PT Goal Formulation: With patient Time For Goal Achievement: 01/04/19 Potential to Achieve Goals: Good    Frequency Min 3X/week   Barriers to discharge        Co-evaluation               AM-PAC PT "6 Clicks" Mobility   Outcome Measure Help needed turning from your back to your side while in a flat bed without using bedrails?: None Help needed moving from lying on your back to sitting on the side of a flat bed without using bedrails?: None Help needed moving to and from a bed to a chair (including a wheelchair)?: A Little Help needed standing up from a chair using your arms (e.g., wheelchair or bedside chair)?: A Little Help needed to walk in hospital room?: A Lot Help needed climbing 3-5 steps with a railing? : A Lot 6 Click Score: 18    End of Session Equipment Utilized During Treatment: Gait belt Activity Tolerance: Patient limited by pain Patient left: in bed;with call bell/phone within reach Nurse Communication: Mobility status PT Visit Diagnosis: Unsteadiness on feet (R26.81);Other abnormalities of gait and mobility (R26.89);History of falling (Z91.81);Muscle weakness (generalized) (M62.81);Pain Pain - Right/Left: Right Pain - part of body: Ankle and joints of foot    Time: [id number] PT Time Calculation (min) (ACUTE ONLY): 17 min   Charges:   PT Evaluation $PT Eval Moderate Complexity: 1 Mod          Isabelle Course, PT, DPT  Eric Patterson 12/21/2018, 12:23 PM

## 2018-12-21 NOTE — Progress Notes (Signed)
STROKE TEAM PROGRESS NOTE   INTERVAL HISTORY Patient is stable, interrogated loop which was negative, patient going home today will order hypercoag panel and f/u in 4 weeks outpatient discussed with Dr. Butler Denmarkizwan stroke will sign off.   Vitals:   12/21/18 0253 12/21/18 0443 12/21/18 0827 12/21/18 1221  BP: (!) 164/108 (!) 141/80 (!) 158/97 (!) 178/99  Pulse: 67 (!) 55 (!) 56 (!) 59  Resp: 18 18 18 18   Temp: 97.8 F (36.6 C) 98 F (36.7 C) 98.6 F (37 C) 98 F (36.7 C)  TempSrc: Axillary Oral Oral Oral  SpO2: 98% 98% 100% 100%  Weight: 72.9 kg     Height: 5\' 8"  (1.727 m)       CBC:  Recent Labs  Lab 12/20/18 1836 12/20/18 2020  WBC 10.7*  --   NEUTROABS 7.3  --   HGB 16.3 17.3*  HCT 48.3 51.0  MCV 90.8  --   PLT 450*  --     Basic Metabolic Panel:  Recent Labs  Lab 12/20/18 1836 12/20/18 2020 12/21/18 0054  NA 135 135  --   K 3.9 7.3* 4.0  CL 102 104  --   CO2 24  --   --   GLUCOSE 106* 91  --   BUN 11 18  --   CREATININE 1.14 1.10  --   CALCIUM 8.8*  --   --    Lipid Panel:     Component Value Date/Time   CHOL 114 12/21/2018 0412   TRIG 88 12/21/2018 0412   HDL 30 (L) 12/21/2018 0412   CHOLHDL 3.8 12/21/2018 0412   VLDL 18 12/21/2018 0412   LDLCALC 66 12/21/2018 0412   HgbA1c:  Lab Results  Component Value Date   HGBA1C 5.5 12/21/2018   Urine Drug Screen:     Component Value Date/Time   LABOPIA NONE DETECTED 03/16/2018 0720   COCAINSCRNUR NONE DETECTED 03/16/2018 0720   LABBENZ NONE DETECTED 03/16/2018 0720   AMPHETMU NONE DETECTED 03/16/2018 0720   THCU NONE DETECTED 03/16/2018 0720   LABBARB NONE DETECTED 03/16/2018 0720    Alcohol Level No results found for: ETH  IMAGING Ct Head Wo Contrast  Result Date: 12/20/2018 CLINICAL DATA:  Right arm numbness since 12 p.m. today. EXAM: CT HEAD WITHOUT CONTRAST TECHNIQUE: Contiguous axial images were obtained from the base of the skull through the vertex without intravenous contrast. COMPARISON:  The  report and 1st image from the examination dated 03/15/2018. FINDINGS: Brain: Old bilateral basal ganglia region lacunar infarcts. Normal size and position of the ventricles. No intracranial hemorrhage, mass lesion or CT evidence of acute infarction. Vascular: No hyperdense vessel or unexpected calcification. Skull: Normal. Negative for fracture or focal lesion. Sinuses/Orbits: Retained secretions in the right frontal sinus. Unremarkable orbits. Other: None. IMPRESSION: 1. No acute abnormality. 2. Old bilateral basal ganglia region lacunar infarcts. Electronically Signed   By: Beckie SaltsSteven  Reid M.D.   On: 12/20/2018 21:08   Mr Angio Head Wo Contrast  Result Date: 12/21/2018 CLINICAL DATA:  Stroke.  Right arm numbness and heaviness EXAM: MRI HEAD WITHOUT  CONTRAST MRA HEAD WITHOUT CONTRAST MRA NECK WITHOUT AND WITH CONTRAST TECHNIQUE: Multiplanar, multiecho pulse sequences of the brain and surrounding structures were obtained without intravenous contrast. Angiographic images of the Circle of Willis were obtained using MRA technique without intravenous contrast. Angiographic images of the neck were obtained using MRA technique without and with intravenous contrast. Carotid stenosis measurements (when applicable) are obtained utilizing NASCET criteria, using  the distal internal carotid diameter as the denominator. CONTRAST:  7 mL Gadovist IV COMPARISON:  CT head 12/20/2018 FINDINGS: MRI HEAD FINDINGS Brain: Patchy areas of acute infarction in the left lower occipital and parietal lobe extending into the left frontal parietal cortex. Small areas of acute infarct in the left parietal white matter. Negative for hemorrhage Ventricle size normal. Mild chronic ischemic changes in the white matter. Negative for mass or edema. No midline shift. Vascular: Normal arterial flow voids. Skull and upper cervical spine: Negative Sinuses/Orbits: Mucosal edema paranasal sinuses.  Normal orbit Other: None MRA HEAD FINDINGS Small vertebral  arteries are widely patent. Basilar is small due to fetal origin of the posterior cerebral arteries bilaterally. Large AICA is patent bilaterally and likely supplies PICA. Superior cerebellar arteries are patent bilaterally. Posterior cerebral arteries patent. Mild atherosclerotic stenosis of the right P2 segment. Aplastic left A1 segment. Subsequent small caliber left internal carotid artery as noted previously. Atherosclerotic mild stenosis left cavernous carotid. Mild stenosis in left MCA branches. Right internal carotid artery patent with mild stenosis in the cavernous segment. Both anterior cerebral arteries supplied from the right and appear patent. Mild stenosis and right MCA branches. No large vessel occlusion. MRA NECK FINDINGS Normal aortic arch. Antegrade flow in the carotid and vertebral arteries bilaterally. Right carotid bifurcation widely patent without significant stenosis Mild atherosclerotic disease left carotid bifurcation without significant stenosis. Apparent stenosis origin right vertebral artery however this appears widely patent on CTA. MRA findings felt to be artifactual. Left vertebral artery widely patent with origin from the aortic arch. IMPRESSION: 1. Patchy areas of acute infarct in the left posterior MCA territory involving occipital parietal and posterior frontal lobes. Negative for intracranial hemorrhage. 2. Mild intracranial atherosclerotic disease in the cavernous carotid and middle cerebral artery branches bilaterally and in the right posterior cerebral artery. No large vessel occlusion 3. No significant carotid or vertebral artery stenosis in the neck. Electronically Signed   By: Franchot Gallo M.D.   On: 12/21/2018 10:14   Mr Angio Neck W Wo Contrast  Result Date: 12/21/2018 CLINICAL DATA:  Stroke.  Right arm numbness and heaviness EXAM: MRI HEAD WITHOUT  CONTRAST MRA HEAD WITHOUT CONTRAST MRA NECK WITHOUT AND WITH CONTRAST TECHNIQUE: Multiplanar, multiecho pulse sequences  of the brain and surrounding structures were obtained without intravenous contrast. Angiographic images of the Circle of Willis were obtained using MRA technique without intravenous contrast. Angiographic images of the neck were obtained using MRA technique without and with intravenous contrast. Carotid stenosis measurements (when applicable) are obtained utilizing NASCET criteria, using the distal internal carotid diameter as the denominator. CONTRAST:  7 mL Gadovist IV COMPARISON:  CT head 12/20/2018 FINDINGS: MRI HEAD FINDINGS Brain: Patchy areas of acute infarction in the left lower occipital and parietal lobe extending into the left frontal parietal cortex. Small areas of acute infarct in the left parietal white matter. Negative for hemorrhage Ventricle size normal. Mild chronic ischemic changes in the white matter. Negative for mass or edema. No midline shift. Vascular: Normal arterial flow voids. Skull and upper cervical spine: Negative Sinuses/Orbits: Mucosal edema paranasal sinuses.  Normal orbit Other: None MRA HEAD FINDINGS Small vertebral arteries are widely patent. Basilar is small due to fetal origin of the posterior cerebral arteries bilaterally. Large AICA is patent bilaterally and likely supplies PICA. Superior cerebellar arteries are patent bilaterally. Posterior cerebral arteries patent. Mild atherosclerotic stenosis of the right P2 segment. Aplastic left A1 segment. Subsequent small caliber left internal carotid artery as noted  previously. Atherosclerotic mild stenosis left cavernous carotid. Mild stenosis in left MCA branches. Right internal carotid artery patent with mild stenosis in the cavernous segment. Both anterior cerebral arteries supplied from the right and appear patent. Mild stenosis and right MCA branches. No large vessel occlusion. MRA NECK FINDINGS Normal aortic arch. Antegrade flow in the carotid and vertebral arteries bilaterally. Right carotid bifurcation widely patent without  significant stenosis Mild atherosclerotic disease left carotid bifurcation without significant stenosis. Apparent stenosis origin right vertebral artery however this appears widely patent on CTA. MRA findings felt to be artifactual. Left vertebral artery widely patent with origin from the aortic arch. IMPRESSION: 1. Patchy areas of acute infarct in the left posterior MCA territory involving occipital parietal and posterior frontal lobes. Negative for intracranial hemorrhage. 2. Mild intracranial atherosclerotic disease in the cavernous carotid and middle cerebral artery branches bilaterally and in the right posterior cerebral artery. No large vessel occlusion 3. No significant carotid or vertebral artery stenosis in the neck. Electronically Signed   By: Marlan Palauharles  Clark M.D.   On: 12/21/2018 10:14   Mr Brain Wo Contrast  Result Date: 12/21/2018 CLINICAL DATA:  Stroke.  Right arm numbness and heaviness EXAM: MRI HEAD WITHOUT  CONTRAST MRA HEAD WITHOUT CONTRAST MRA NECK WITHOUT AND WITH CONTRAST TECHNIQUE: Multiplanar, multiecho pulse sequences of the brain and surrounding structures were obtained without intravenous contrast. Angiographic images of the Circle of Willis were obtained using MRA technique without intravenous contrast. Angiographic images of the neck were obtained using MRA technique without and with intravenous contrast. Carotid stenosis measurements (when applicable) are obtained utilizing NASCET criteria, using the distal internal carotid diameter as the denominator. CONTRAST:  7 mL Gadovist IV COMPARISON:  CT head 12/20/2018 FINDINGS: MRI HEAD FINDINGS Brain: Patchy areas of acute infarction in the left lower occipital and parietal lobe extending into the left frontal parietal cortex. Small areas of acute infarct in the left parietal white matter. Negative for hemorrhage Ventricle size normal. Mild chronic ischemic changes in the white matter. Negative for mass or edema. No midline shift. Vascular:  Normal arterial flow voids. Skull and upper cervical spine: Negative Sinuses/Orbits: Mucosal edema paranasal sinuses.  Normal orbit Other: None MRA HEAD FINDINGS Small vertebral arteries are widely patent. Basilar is small due to fetal origin of the posterior cerebral arteries bilaterally. Large AICA is patent bilaterally and likely supplies PICA. Superior cerebellar arteries are patent bilaterally. Posterior cerebral arteries patent. Mild atherosclerotic stenosis of the right P2 segment. Aplastic left A1 segment. Subsequent small caliber left internal carotid artery as noted previously. Atherosclerotic mild stenosis left cavernous carotid. Mild stenosis in left MCA branches. Right internal carotid artery patent with mild stenosis in the cavernous segment. Both anterior cerebral arteries supplied from the right and appear patent. Mild stenosis and right MCA branches. No large vessel occlusion. MRA NECK FINDINGS Normal aortic arch. Antegrade flow in the carotid and vertebral arteries bilaterally. Right carotid bifurcation widely patent without significant stenosis Mild atherosclerotic disease left carotid bifurcation without significant stenosis. Apparent stenosis origin right vertebral artery however this appears widely patent on CTA. MRA findings felt to be artifactual. Left vertebral artery widely patent with origin from the aortic arch. IMPRESSION: 1. Patchy areas of acute infarct in the left posterior MCA territory involving occipital parietal and posterior frontal lobes. Negative for intracranial hemorrhage. 2. Mild intracranial atherosclerotic disease in the cavernous carotid and middle cerebral artery branches bilaterally and in the right posterior cerebral artery. No large vessel occlusion 3. No significant carotid  or vertebral artery stenosis in the neck. Electronically Signed   By: Marlan Palau M.D.   On: 12/21/2018 10:14   2D Echocardiogram  1. The left ventricle has normal systolic function with an  ejection fraction of 60-65%. The cavity size was normal. There is moderately increased left ventricular wall thickness. Left ventricular diastolic Doppler parameters are consistent with impaired  relaxation.  2. The right ventricle has normal systolic function. The cavity was normal.  3. The mitral valve is grossly normal.  4. The tricuspid valve is grossly normal.  5. The aortic valve is tricuspid. Mild thickening of the aortic valve. No stenosis of the aortic valve.  6. The aorta is abnormal in size and structure.  7. There is mild dilatation of the ascending aorta measuring 39 mm.  8. Normal LV systolic function; mild diastolic dysfunction; moderate LVH; mildly dilated ascending aorta.   PHYSICAL EXAM  Exam: NAD, pleasant                  Speech:    Speech is normal; fluent and spontaneous with normal comprehension.  Cognition:    The patient is oriented to person, place, and time;     recent and remote memory intact;     language fluent;    Cranial Nerves:    The pupils are equal, round, and reactive to light.Trigeminal sensation is intact and the muscles of mastication are normal. The face is symmetric. The palate elevates in the midline. Hearing intact. Voice is normal. Shoulder shrug is normal. The tongue has normal motion without fasciculations.   Coordination:  No dysmetria  Motor Observation:    No asymmetry, no atrophy, and no involuntary movements noted. Tone:    Normal muscle tone.     Strength: right arm slight weakness, otherwise strength is V/V in the upper and lower limbs.      Sensation: intact to LT     ASSESSMENT/PLAN [name] is a 52 y.o. male with history of previous cryptogenic strokes without source found, HTN, HLD, tobacco abuse presenting with sudden onset R arm numbness and weakness.   Stroke:   Patchy posterior L MCA infarct embolic secondary to unknown source. Prior cryptogenic stroke without source found.   CT head No acute  abnormality. Old B BG lacunes  MRI  Patchy L posterior MCA occipital and posterior frontal lobe infarcts. No hmg  MRA head  Mild atherosclerosis B ICA and MCA branches. No LVO  MRA neck Unremarkable   2D Echo EF 60-65%. No source of embolus   Loop placed 03/2018 interrogated today. No AF seen since insertion.   Cannot find that hypercoagulable labs were done in the past - HIV neg several times. 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 F/u in office  LDL 66  HgbA1c 5.5  Lovenox 40 mg sq daily for VTE prophylaxis  clopidogrel 75 mg daily prior to admission, now on aspirin 81 mg daily and clopidogrel 75 mg daily. Continue DAPT x 3 weeks then PLAVIX alone  Therapy recommendations:  HH PT, no SLP  Disposition:  Return home Ok for d/c from stroke standpoint Follow-up Stroke Clinic at [facility] in 4 weeks. Office will call with appointment date and time. Order placed.  Hypertension  Stable . Permissive hypertension (OK if < 220/120) but gradually normalize in 5-7 days . Long-term BP goal normotensive  Hyperlipidemia  Home meds:  lipitor 40, resumed in hospital  LDL 66, goal <  70  Continue statin at discharge  Other Stroke Risk Factors  Cigarette smoker, advised to stop smoking  ETOH use, advised to drink no more than 2 drink(s) a day  Hx stroke/TIA  02/2018 - large L basal ganglia/corona radiata embolic infarct w/o source found. TEE neg. Loop placed. Put on DAPT->plavix.  ? 11/2015 R posterior corona radiata and posterior external capsule infarct due to small vessel disease with mult risk factors  Family hx stroke (mother, brother, other)  Carotid stenosis - 11/2015 - R ICA 40-59% stenosis  Other Active Problems  Hyperkalemia  R foot injury   Hospital day # 0  Personally examined patient and images, and have participated in and made any corrections needed to history, physical, neuro  exam,assessment and plan as stated above.  I have personally obtained the history, evaluated lab date, reviewed imaging studies and agree with radiology interpretations.    Naomie DeanAntonia , MD Stroke Neurology   A total of 35 minutes was spent for the care of this patient, spent on counseling patient and family on different diagnostic and therapeutic options, counseling and coordination of care, riskd ans benefits of management, compliance, or risk factor reduction and education.   To contact Stroke Continuity provider, please refer to [email or web address]. After hours, contact General Neurology

## 2018-12-22 LAB — SICKLE CELL SCREEN: Sickle Cell Screen: NEGATIVE

## 2018-12-22 LAB — ANTINUCLEAR ANTIBODIES, IFA: ANA Ab, IFA: NEGATIVE

## 2018-12-23 LAB — LUPUS ANTICOAGULANT PANEL
DRVVT: 38.4 s (ref 0.0–47.0)
PTT Lupus Anticoagulant: 36.4 s (ref 0.0–51.9)

## 2018-12-23 LAB — CARDIOLIPIN ANTIBODIES, IGG, IGM, IGA
Anticardiolipin IgA: <9 APL U/mL (ref 0–11)
Anticardiolipin IgG: 43 GPL U/mL — ABNORMAL HIGH (ref 0–14)
Anticardiolipin IgM: <9 MPL U/mL (ref 0–12)

## 2018-12-27 ENCOUNTER — Other Ambulatory Visit: Payer: Self-pay

## 2018-12-27 NOTE — Patient Outreach (Signed)
[facility]) Care Management  12/27/2018  Rydan Gulyas 1965-08-02 [id number]    EMMI-STROKE-Not on APL RED ON EMMI ALERT Day # 3 Date: 12/26/2018 Red Alert Reason: " Feeling worse overall? Yes"   Outreach attempt # 1 to patient. Spoke with patient who denies any acute issues or concerns at this time. Reviewed and addressed red alert with patient. He voices that yesterday he was having some "dizziness." He attributes it to unrelated stroke problem. He voices that he was having some pain in foot and took some pain medicine but med made him slightly dizzy. He states that symptoms has resolved. Patient has not made neuro follow up appt yet but encouraged to do so. He has discharge paperwork and knows how to schedule appt. He confirms that he has transportation to appt. He voices that he has all his meds in the home and no issues or concerns regarding them. He denies any further RN CM needs or concerns at this time. Advised patient that they would continue to get automated EMMI-Stroke post discharge calls to assess how they are doing following recent hospitalization and will receive a call from a nurse if any of their responses were abnormal. Patient voiced understanding and was appreciative of f/u call.    Plan: RN CM will close case as no further interventions needed at this time.   Enzo Montgomery, RN,BSN,CCM [facility] Telephonic Care Management Coordinator Direct Phone: [phone] Toll Free: [phone] Fax: [phone]

## 2019-01-03 ENCOUNTER — Ambulatory Visit (INDEPENDENT_AMBULATORY_CARE_PROVIDER_SITE_OTHER): Payer: 59 | Admitting: Family Medicine

## 2019-01-03 ENCOUNTER — Other Ambulatory Visit: Payer: Self-pay

## 2019-01-03 ENCOUNTER — Encounter: Payer: Self-pay | Admitting: Family Medicine

## 2019-01-03 DIAGNOSIS — S8011XA Contusion of right lower leg, initial encounter: Secondary | ICD-10-CM | POA: Diagnosis not present

## 2019-01-03 DIAGNOSIS — I639 Cerebral infarction, unspecified: Secondary | ICD-10-CM | POA: Diagnosis not present

## 2019-01-03 DIAGNOSIS — Z720 Tobacco use: Secondary | ICD-10-CM

## 2019-01-03 MED ORDER — TRAMADOL HCL 50 MG PO TABS: 50.0000 mg | ORAL_TABLET | Freq: Two times a day (BID) | ORAL | 0 refills | Status: AC | PRN

## 2019-01-03 MED ORDER — NICOTINE POLACRILEX 2 MG MT LOZG: 2.0000 mg | LOZENGE | OROMUCOSAL | 0 refills | Status: DC | PRN

## 2019-01-03 MED ORDER — CLOPIDOGREL BISULFATE 75 MG PO TABS: 75.0000 mg | ORAL_TABLET | Freq: Every day | ORAL | 1 refills | Status: DC

## 2019-01-03 NOTE — Progress Notes (Signed)
[facility] Phone: [phone]     Eric Patterson - 53 y.o. male MRN [id number]  Date of birth: 12/05/1965  Subjective:   cc: new patient, CVA,   HPI:  Injured leg: patient hurt his leg changing the oil in the machine at work when he dropped something in it.  He is seeing an orthopedist for this and his next visit is on aug 26th.     Strokes:   First stroke was in 2017. Most recent one was august 5th.  Symptoms of most recent stroke were right hand weakness.  No facial droop. right Hand is still a liittle numb. He says his Strength is back almost. He has another neurologist appointment on Aug 27th.  He has been on nicotine patch all this week.  He still Smokes one cigarette a day. He would like to use the lozenge nicotine replacement.   Patient would like to get a colonoscopy.   ROS: See HPI for pertinent positives and negatives  Past Medical History  Family history reviewed for today's visit. No changes.  Social history- patient is a current smoker  Health Maintenance:  -  Health Maintenance Due  Topic Date Due  . COLONOSCOPY  06/17/2016  . INFLUENZA VACCINE  12/16/2018    -  reports that he has been smoking cigarettes. He has been smoking about 0.25 packs per day. He has never used smokeless tobacco.  Objective:   BP 120/74   Pulse 90   Ht 5\' 6"  (1.676 m)   Wt 165 lb (74.8 kg)   SpO2 99%   BMI 26.63 kg/m  Gen: NAD, alert and oriented, cooperative with exam. Sitting in wheelchair with right leg in cast.  HEENT: NCAT, EOMI, MMM CV: normal rate, regular rhythm. No murmurs, no rubs.  Resp: LCTAB, no wheezes, crackles. normal work of breathing GI: nontender to palpation, BS present, no guarding or organomegaly Msk: strength equal bilaterally in upper extremities.  Neuro: CN II-XII grossly intact. no gross deficits Psych: Appropriate behavior  Assessment/Plan:   Contusion of right leg Patient's right leg is in a cast.  He states he injured it  at work, when something fell on his leg.  X-rays do not show any fracture.  He states he is seeing the orthopedist in 1 week.  He would like a refill on his tramadol. - Filled prescription for 7 days of tramadol to last patient until his orthopedist appointment. - Filled out short-term disability paperwork for patient.  CVA (cerebral vascular accident) [facility]) Patient recently had CVA earlier this month on 8/5.  Complains of some mild right-sided upper extremity weakness.  Unsure of lower extremity due to immobilization in cast.  Discharge summary says he is to continue dual antiplatelet therapy until neurology appointment, which he states is later this month on the 26. -Ordered clopidogrel as patient was not taking it  Tobacco use Patient states he is down to 1 cigarette a day.  Currently using a nicotine patch.  Would like to try the lozenge. -Continue nicotine patch. - Prescribed nicotine lozenge for patient.   Meds ordered this encounter  Medications  . traMADol (ULTRAM) 50 MG tablet    Sig: Take 1 tablet (50 mg total) by mouth every 12 (twelve) hours as needed for up to 7 days for moderate pain.    Dispense:  14 tablet    Refill:  0  . nicotine polacrilex (NICOTINE MINI) 2 MG lozenge    Sig: Take 1 lozenge (2  mg total) by mouth as needed for smoking cessation.    Dispense:  100 tablet    Refill:  0  . clopidogrel (PLAVIX) 75 MG tablet    Sig: Take 1 tablet (75 mg total) by mouth daily.    Dispense:  30 tablet    Refill:  1     Frederic Jerichoan Olson, MD PGY-2 [facility]

## 2019-01-03 NOTE — Patient Instructions (Addendum)
It was nice to see you today,  Things we discussed today include:  - Take your Plavix and aspirin once a day until you talk with the neurologist at your next appointment.  If you develop side effects stop taking the Plavix. - You can use the nicotine lozenge and patch at the same time.  I have prescribed a lozenge you can pick up.  Use as directed on the box. - I have given you a 7-day supply of tramadol.  This will last you until you see the orthopedist on the 26.  I like to see you back in 1 month.  Please bring your blood pressure monitor so we can see if it was high in between visits.  Clemetine Marker, MD

## 2019-01-05 ENCOUNTER — Telehealth (INDEPENDENT_AMBULATORY_CARE_PROVIDER_SITE_OTHER): Payer: 59 | Admitting: Family Medicine

## 2019-01-05 ENCOUNTER — Other Ambulatory Visit: Payer: Self-pay

## 2019-01-05 DIAGNOSIS — Z20828 Contact with and (suspected) exposure to other viral communicable diseases: Secondary | ICD-10-CM | POA: Diagnosis not present

## 2019-01-05 DIAGNOSIS — Z20822 Contact with and (suspected) exposure to covid-19: Secondary | ICD-10-CM

## 2019-01-05 NOTE — Progress Notes (Signed)
160# BP- N/A T-  NO FEVER  [id number] - [place], [place] - [address] AT [address]  PT GAVE CONSENT TO TELEPHONE VISIT. Salvatore Marvel, CMA

## 2019-01-05 NOTE — Progress Notes (Signed)
[facility] Visit  Patient consented to have virtual visit. Method of visit: Telephone  Encounter participants: Patient: Eric Patterson - located at home Provider: Rory Percy - located at [facility] Others (if applicable): Wife  Chief Complaint: Exposure to COVID  HPI:  Mother went to a wedding on Saturday and patient then went to mother's house on Monday to pick some of the leftover food.  Wife then found yesterday that his mother tested positive for COVID.  His mother is doing fairly well but is having difficulties talking.  Patient does not have any current symptoms including fever, difficulties breathing, cough, congestion, nausea, vomiting, diarrhea.  He is a current smoker.  ROS: per HPI  Pertinent PMHx: HTN, h/o stroke, HLD, tobacco use  Exam:  Spoke with wife over the phone, unable to examine patient.  Assessment/Plan:  Exposure to Covid-19 Virus Mother tested positive for COVID with patient with close contact.  Currently asymptomatic.  Will order COVID testing.  Instructions given regarding testing as well as isolation guidelines.  He should continue to wear his mask and practice good hand hygiene and self isolate at home until results return.  Also recommended smoking cessation.    Time spent during visit with patient: 6 minutes

## 2019-01-05 NOTE — Assessment & Plan Note (Addendum)
Mother tested positive for COVID with patient with close contact.  Currently asymptomatic.  Will order COVID testing.  Instructions given regarding testing as well as isolation guidelines.  He should continue to wear his mask and practice good hand hygiene and self isolate at home until results return.  Also recommended smoking cessation.

## 2019-01-06 DIAGNOSIS — S8011XA Contusion of right lower leg, initial encounter: Secondary | ICD-10-CM | POA: Insufficient documentation

## 2019-01-06 NOTE — Assessment & Plan Note (Signed)
Patient's right leg is in a cast.  He states he injured it at work, when something fell on his leg.  X-rays do not show any fracture.  He states he is seeing the orthopedist in 1 week.  He would like a refill on his tramadol. - Filled prescription for 7 days of tramadol to last patient until his orthopedist appointment. - Filled out short-term disability paperwork for patient.

## 2019-01-06 NOTE — Assessment & Plan Note (Signed)
Patient recently had CVA earlier this month on 8/5.  Complains of some mild right-sided upper extremity weakness.  Unsure of lower extremity due to immobilization in cast.  Discharge summary says he is to continue dual antiplatelet therapy until neurology appointment, which he states is later this month on the 26. -Ordered clopidogrel as patient was not taking it

## 2019-01-06 NOTE — Assessment & Plan Note (Signed)
Patient states he is down to 1 cigarette a day.  Currently using a nicotine patch.  Would like to try the lozenge. -Continue nicotine patch. - Prescribed nicotine lozenge for patient.

## 2019-01-08 ENCOUNTER — Other Ambulatory Visit: Payer: Self-pay

## 2019-01-08 ENCOUNTER — Ambulatory Visit (INDEPENDENT_AMBULATORY_CARE_PROVIDER_SITE_OTHER): Payer: 59 | Admitting: *Deleted

## 2019-01-08 DIAGNOSIS — Z20822 Contact with and (suspected) exposure to covid-19: Secondary | ICD-10-CM

## 2019-01-08 DIAGNOSIS — I639 Cerebral infarction, unspecified: Secondary | ICD-10-CM

## 2019-01-09 LAB — CUP PACEART REMOTE DEVICE CHECK
Date Time Interrogation Session: 20200824223926
Implantable Pulse Generator Implant Date: 20191101

## 2019-01-09 LAB — NOVEL CORONAVIRUS, NAA: SARS-CoV-2, NAA: NOT DETECTED

## 2019-01-11 ENCOUNTER — Ambulatory Visit (INDEPENDENT_AMBULATORY_CARE_PROVIDER_SITE_OTHER): Payer: 59 | Admitting: Neurology

## 2019-01-11 ENCOUNTER — Encounter: Payer: Self-pay | Admitting: Neurology

## 2019-01-11 ENCOUNTER — Other Ambulatory Visit: Payer: Self-pay

## 2019-01-11 ENCOUNTER — Telehealth: Payer: Self-pay | Admitting: Neurology

## 2019-01-11 VITALS — BP 137/93 | HR 87 | Temp 98.7°F

## 2019-01-11 DIAGNOSIS — I639 Cerebral infarction, unspecified: Secondary | ICD-10-CM | POA: Diagnosis not present

## 2019-01-11 NOTE — Telephone Encounter (Signed)
The patient seems to have done quite well from his recent stroke but his foot problem is the reason which took him out of work and hence he needs to get permission from the foot doctor

## 2019-01-11 NOTE — Patient Instructions (Signed)
I had a long d/w patient about his recent stroke, risk for recurrent stroke/TIAs, personally independently reviewed imaging studies and stroke evaluation results and answered questions.Continue aspirin 81 mg daily  for secondary stroke prevention and maintain strict control of hypertension with blood pressure goal below 130/90, diabetes with hemoglobin A1c goal below 6.5% and lipids with LDL cholesterol goal below 70 mg/dL. I also advised the patient to eat a healthy diet with plenty of whole grains, cereals, fruits and vegetables, exercise regularly and maintain ideal body weight  Consider  particpation in [place] trial for cryptogenic stroke if interested..Followup in the future with my nurse practitiner Janett Billow in 3 months or call earlier if needed.  Stroke Prevention Some medical conditions and behaviors are associated with a higher chance of having a stroke. You can help prevent a stroke by making nutrition, lifestyle, and other changes, including managing any medical conditions you may have. What nutrition changes can be made?   Eat healthy foods. You can do this by: ? Choosing foods high in fiber, such as fresh fruits and vegetables and whole grains. ? Eating at least 5 or more servings of fruits and vegetables a day. Try to fill half of your plate at each meal with fruits and vegetables. ? Choosing lean protein foods, such as lean cuts of meat, poultry without skin, fish, tofu, beans, and nuts. ? Eating low-fat dairy products. ? Avoiding foods that are high in salt (sodium). This can help lower blood pressure. ? Avoiding foods that have saturated fat, trans fat, and cholesterol. This can help prevent high cholesterol. ? Avoiding processed and premade foods.  Follow your health care provider's specific guidelines for losing weight, controlling high blood pressure (hypertension), lowering high cholesterol, and managing diabetes. These may include: ? Reducing your daily calorie intake. ?  Limiting your daily sodium intake to 1,500 milligrams (mg). ? Using only healthy fats for cooking, such as olive oil, canola oil, or sunflower oil. ? Counting your daily carbohydrate intake. What lifestyle changes can be made?  Maintain a healthy weight. Talk to your health care provider about your ideal weight.  Get at least 30 minutes of moderate physical activity at least 5 days a week. Moderate activity includes brisk walking, biking, and swimming.  Do not use any products that contain nicotine or tobacco, such as cigarettes and e-cigarettes. If you need help quitting, ask your health care provider. It may also be helpful to avoid exposure to secondhand smoke.  Limit alcohol intake to no more than 1 drink a day for nonpregnant women and 2 drinks a day for men. One drink equals 12 oz of beer, 5 oz of wine, or 1 oz of hard liquor.  Stop any illegal drug use.  Avoid taking birth control pills. Talk to your health care provider about the risks of taking birth control pills if: ? You are over 44 years old. ? You smoke. ? You get migraines. ? You have ever had a blood clot. What other changes can be made?  Manage your cholesterol levels. ? Eating a healthy diet is important for preventing high cholesterol. If cholesterol cannot be managed through diet alone, you may also need to take medicines. ? Take any prescribed medicines to control your cholesterol as told by your health care provider.  Manage your diabetes. ? Eating a healthy diet and exercising regularly are important parts of managing your blood sugar. If your blood sugar cannot be managed through diet and exercise, you may need to take  medicines. ? Take any prescribed medicines to control your diabetes as told by your health care provider.  Control your hypertension. ? To reduce your risk of stroke, try to keep your blood pressure below 130/80. ? Eating a healthy diet and exercising regularly are an important part of  controlling your blood pressure. If your blood pressure cannot be managed through diet and exercise, you may need to take medicines. ? Take any prescribed medicines to control hypertension as told by your health care provider. ? Ask your health care provider if you should monitor your blood pressure at home. ? Have your blood pressure checked every year, even if your blood pressure is normal. Blood pressure increases with age and some medical conditions.  Get evaluated for sleep disorders (sleep apnea). Talk to your health care provider about getting a sleep evaluation if you snore a lot or have excessive sleepiness.  Take over-the-counter and prescription medicines only as told by your health care provider. Aspirin or blood thinners (antiplatelets or anticoagulants) may be recommended to reduce your risk of forming blood clots that can lead to stroke.  Make sure that any other medical conditions you have, such as atrial fibrillation or atherosclerosis, are managed. What are the warning signs of a stroke? The warning signs of a stroke can be easily remembered as BEFAST.  B is for balance. Signs include: ? Dizziness. ? Loss of balance or coordination. ? Sudden trouble walking.  E is for eyes. Signs include: ? A sudden change in vision. ? Trouble seeing.  F is for face. Signs include: ? Sudden weakness or numbness of the face. ? The face or eyelid drooping to one side.  A is for arms. Signs include: ? Sudden weakness or numbness of the arm, usually on one side of the body.  S is for speech. Signs include: ? Trouble speaking (aphasia). ? Trouble understanding.  T is for time. ? These symptoms may represent a serious problem that is an emergency. Do not wait to see if the symptoms will go away. Get medical help right away. Call your local emergency services (911 in the [place] Do not drive yourself to the hospital.  Other signs of stroke may include: ? A sudden, severe headache with  no known cause. ? Nausea or vomiting. ? Seizure. Where to find more information For more information, visit:  American Stroke Association: www.strokeassociation.org  National Stroke Association: www.stroke.org Summary  You can prevent a stroke by eating healthy, exercising, not smoking, limiting alcohol intake, and managing any medical conditions you may have.  Do not use any products that contain nicotine or tobacco, such as cigarettes and e-cigarettes. If you need help quitting, ask your health care provider. It may also be helpful to avoid exposure to secondhand smoke.  Remember BEFAST for warning signs of stroke. Get help right away if you or a loved one has any of these signs. This information is not intended to replace advice given to you by your health care provider. Make sure you discuss any questions you have with your health care provider. Document Released: 06/10/2004 Document Revised: 04/15/2017 Document Reviewed: 06/08/2016 Elsevier Patient Education  2020 [employer]

## 2019-01-11 NOTE — Telephone Encounter (Signed)
Patient's spouse, Arbie Cookey came in today wanting to be called to discuss if patient can come back to work. She states he has been having foot problems and still recuperating from his stroke.

## 2019-01-11 NOTE — Progress Notes (Signed)
[facility] [address] [place]. [place] [id number] [phone]       OFFICE FOLLOW-UP NOTE  [name] Date of Birth:  09/25/1965 Medical Record Number:  [id number]   HPI: Eric Patterson is a 53 year old African-American male seen today for initial office follow-up visit following hospital admission for stroke in October 2019.  He is accompanied by his wife.  History is obtained from them and review of electronic medical records.  I personally reviewed imaging films.  He presented with sudden onset of right facial weakness upon arising on 03/15/2018.  He was also noted dragging his right leg.  He did have a prior history of stroke and had some residual right-sided weakness but this appeared to be more than baseline.  He was not a candidate for TPA as he presented outside the window.  CT scan of the head showed large acute left basal ganglia infarct.  MRI scan of the brain confirmed a large left basal ganglia infarct as well as showed an old right posterior corona radiata lacunar.  MRA of the brain showed mild to moderate intracranial atherosclerotic changes with short segment stenosis of the left M2 segment.  Carotid Doppler showed no significant extracranial stenosis.  Transthoracic echo showed normal ejection fraction.  Transesophageal echocardiogram was also normal.  LDL cholesterol was slightly elevated at 101 mg percent.  Hemoglobin A1c was 5.3.  Patient stroke was felt to be cryptogenic and he underwent loop recorder insertion and so for paroxysmal atrial fibrillation has not yet been found.  Urine drug screen was negative.  Patient was discharged home on aspirin and Plavix and states he is tolerated both medications well without bleeding or bruising.  He is participating in outpatient physical and occupational therapy.  He feels he is slowly returning back to normal.  He plans to return to work a month later.  He has tried to quit smoking but has managed to cut back but not  quit completely yet but plans to do so with the help of his primary physician.  He has no new complaints today.  There is no family history of strokes or heart attacks in a young age.  He has remote history of syphilis in 2016 but states he was treated for that.  He denies any history of DVT or pulmonary embolism. Update 01/11/2019: Patient is seen today after last visit in November 2019.  He is accompanied by his wife.  He was recently readmitted with a stroke.  He presented on 12/21/2018 to [facility] emergency room with sudden onset of right arm numbness and weakness.  His blood pressure was elevated elevated at 183/98 in the ED.  He presented outside time window for thrombolysis.  MRI scan showed patchy left posterior MCA occipital and posterior frontal lobe infarcts without hemorrhage.  MRA showed only mild atherosclerosis involving both internal carotid arteries and MCA branches.  No large vessel stenosis or occlusion.  MRI of the neck showed no significant extracranial stenosis in the neck.  2D echo showed normal ejection fraction without cardiac source of embolism.  Patient had previously loop recorder inserted following his last stroke in November 2019 and no atrial fibrillation was found on interrogation.  Patient's RPR titer was low 1 is to 2 which was felt not to be significant.  Hypercoagulable labs showed only slightly elevated IgG for antiphospholipid antibodies.  Lupus anticoagulant was negative.  LDL was 66 mg percent.  Hemoglobin A1c was 5.5.  Patient is started on dual antiplatelet  therapy aspirin and Plavix and discharged home with physical occupational therapy.  Patient states that his therapy is ongoing.  He feels his right upper extremity strength is improved but he still has some numbness and tingling in his fingers.  Patient has a cast in his right foot due to work demands compensation injury.  He is using wheelchair for outdoors and long distances.  He is awaiting delivery of her walker that  he can use at home.  He has filed for [employer] and is thinking about whether he can qualify for disability.  He has no new complaints today.  He is tolerating aspirin and Plavix with minor bruising but no bleeding.  His blood pressure is under good control and today it is 137/93.  He is tolerating Lipitor well without muscle aches and pains.  He is trying to quit smoking. ROS:   14 system review of systems is positive for gait difficulty, right leg pain, leg weakness and all other systems negative PMH:  Past Medical History:  Diagnosis Date   Foot injury 12/07/2018   Hyperlipidemia    Hypertension    Stroke (HCC) 2017   R side weakness deficits    Social History:  Social History   Socioeconomic History   Marital status: Married    Spouse name: Not on file   Number of children: 1   Years of education: 12   Highest education level: Not on file  Occupational History   Occupation: Environmental consultant strain: Not on file   Food insecurity    Worry: Not on file    Inability: Not on file   Transportation needs    Medical: Not on file    Non-medical: Not on file  Tobacco Use   Smoking status: Light Tobacco Smoker    Packs/day: 0.25    Types: Cigarettes   Smokeless tobacco: Never Used   Tobacco comment: smoke a whole week in a pack  Substance and Sexual Activity   Alcohol use: Not Currently   Drug use: No   Sexual activity: Not on file  Lifestyle   Physical activity    Days per week: Not on file    Minutes per session: Not on file   Stress: Not on file  Relationships   Social connections    Talks on phone: Not on file    Gets together: Not on file    Attends religious service: Not on file    Active member of club or organization: Not on file    Attends meetings of clubs or organizations: Not on file    Relationship status: Not on file   Intimate partner violence    Fear of current or ex partner: Not on file     Emotionally abused: Not on file    Physically abused: Not on file    Forced sexual activity: Not on file  Other Topics Concern   Not on file  Social History Narrative   Fun/Hobby: Work on his truck     Medications:   Current Outpatient Medications on File Prior to Visit  Medication Sig Dispense Refill   amLODipine-benazepril (LOTREL) 10-20 MG capsule Take 1 capsule by mouth daily.     aspirin EC 81 MG tablet Take 1 tablet (81 mg total) by mouth daily for 21 days. 21 tablet 2   atorvastatin (LIPITOR) 40 MG tablet Take 1 tablet (40 mg total) by mouth daily at 6 PM. 30 tablet 0  gabapentin (NEURONTIN) 300 MG capsule Take 300 mg by mouth 2 (two) times daily as needed (for pain).      nicotine (NICODERM CQ - DOSED IN MG/24 HOURS) 21 mg/24hr patch Place 1 patch (21 mg total) onto the skin daily. 7 patch 0   nicotine polacrilex (NICOTINE MINI) 2 MG lozenge Take 1 lozenge (2 mg total) by mouth as needed for smoking cessation. 100 tablet 0   No current facility-administered medications on file prior to visit.     Allergies:  No Known Allergies  Physical Exam General: well developed, well nourished middle-aged African-American male, seated, in no evident distress Head: head normocephalic and atraumatic.  Neck: supple with no carotid or supraclavicular bruits Cardiovascular: regular rate and rhythm, no murmurs Musculoskeletal: no deformity.  Right leg is in a cast. Skin:  no rash/petichiae Vascular:  Normal pulses all extremities Vitals:   01/11/19 1118  BP: (!) 137/93  Pulse: 87  Temp: 98.7 F (37.1 C)   Neurologic Exam Mental Status: Awake and fully alert. Oriented to place and time. Recent and remote memory intact. Attention span, concentration and fund of knowledge appropriate. Mood and affect appropriate.  Cranial Nerves: Fundoscopic exam reveals sharp disc margins. Pupils equal, briskly reactive to light. Extraocular movements full without nystagmus. Visual fields full  to confrontation. Hearing intact. Facial sensation intact. Face, tongue, palate moves normally and symmetrically.  Motor: Normal bulk and tone. Normal strength in all tested extremity muscles.  Right leg testing limited due to cast and has mild drift.  Diminished fine finger movements on the right.  Orbits left over right upper extremity. Sensory.: intact to touch ,pinprick .position and vibratory sensation.  Coordination: Rapid alternating movements normal in all extremities. Finger-to-nose and heel-to-shin performed accurately bilaterally. Gait and Station: Deferred today as patient is using a wheelchair.   Reflexes: 1+ and symmetric. Toes downgoing.   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     PLAN: I had a long d/w patient about his recent stroke, risk for recurrent stroke/TIAs, personally independently reviewed imaging studies and stroke evaluation results and answered questions.Continue aspirin 81 mg daily  for secondary stroke prevention and maintain strict control of hypertension with blood pressure goal below 130/90, diabetes with hemoglobin A1c goal below 6.5% and lipids with LDL cholesterol goal below 70 mg/dL. I also advised the patient to eat a healthy diet with plenty of whole grains, cereals, fruits and vegetables, exercise regularly and maintain ideal body weight I encouraged him to continue to quit smoking.  Consider  particpation in [place] trial for cryptogenic stroke if interested..Followup in the future with my nurse practitiner Eric Patterson in 3 months or call earlier if needed. Greater than 50% of time during this 25 minute visit was spent on counseling,explanation of diagnosis of cryptogenic stroke, planning of further management, discussion with patient and  family and coordination of care Antony Contras, MD  [facility] [address] [place], [place] [address]  Phone [phone] Fax [phone] Note: This document was prepared with digital dictation and possible smart phrase technology. Any transcriptional errors that result from this process are unintentional

## 2019-01-15 NOTE — Progress Notes (Signed)
Carelink Summary Report / Loop Recorder 

## 2019-02-06 ENCOUNTER — Other Ambulatory Visit: Payer: Self-pay

## 2019-02-06 ENCOUNTER — Encounter: Payer: Self-pay | Admitting: Family Medicine

## 2019-02-06 ENCOUNTER — Ambulatory Visit (INDEPENDENT_AMBULATORY_CARE_PROVIDER_SITE_OTHER): Payer: 59 | Admitting: Family Medicine

## 2019-02-06 ENCOUNTER — Telehealth: Payer: Self-pay | Admitting: *Deleted

## 2019-02-06 VITALS — BP 168/82 | HR 95 | Wt 174.6 lb

## 2019-02-06 DIAGNOSIS — Z0000 Encounter for general adult medical examination without abnormal findings: Secondary | ICD-10-CM | POA: Diagnosis not present

## 2019-02-06 DIAGNOSIS — Z23 Encounter for immunization: Secondary | ICD-10-CM

## 2019-02-06 DIAGNOSIS — I10 Essential (primary) hypertension: Secondary | ICD-10-CM

## 2019-02-06 DIAGNOSIS — S8011XA Contusion of right lower leg, initial encounter: Secondary | ICD-10-CM

## 2019-02-06 MED ORDER — AMLODIPINE BESY-BENAZEPRIL HCL 10-20 MG PO CAPS: 1.0000 | ORAL_CAPSULE | Freq: Every day | ORAL | 3 refills | Status: DC

## 2019-02-06 NOTE — Patient Instructions (Addendum)
I would like you to restart your blood pressure medicine.  I would like you to take your blood pressure once a day in the morning and record the value for the next week.  After 7 days please call our office and give [place] the results of the last 7 days so that they can record it in your chart.  I will then follow-up with you afterwards to see if you need to continue checking your blood pressure or change medication, or if you can continue with your current medication.  I have put in referral for colonoscopy, someone will call you to schedule an appointment.  I would like you to follow-up in 3 months so we can talk about your blood pressure and ankle.   You can take Tylenol 1000 mg every 8 hours as needed for pain.  I would try this for pain before increasing your ibuprofen or other medications.  Have a great day,  Clemetine Marker, MD

## 2019-02-06 NOTE — Telephone Encounter (Signed)
Can you let her know I have not worked with any of the physicians at this office and do not know them well enough to provide a 'recommendation' for any particular physician. If the patient desires a switch between physicians at the same practice it is often best if that particular practice discusses with the patient who a good alternative would be.

## 2019-02-06 NOTE — Telephone Encounter (Signed)
Pts wife calls, she would like to see someone other than Dr. Leonie Man.  She is ok with continuing @ "[facility] but not with him."  She would like Dr. Jeannine Kitten to recommend someone. Christen Bame, CMA

## 2019-02-06 NOTE — Progress Notes (Signed)
   [facility] Phone: [phone]     Eric Patterson - 53 y.o. male MRN [id number]  Date of birth: 09-12-1965  Subjective:   cc: HTN, foot pain  HPI:  HTN:  Patient states the neurologist told him not to take the BP medication so he can get in a possible drug trial.  The patient has not taken his BP since he stopped taking his medication. This appointment was approximately 3 weeks ago. NO headaches, No abd pain, no chest pain. He still has some meds left but would like a refill.   Ankle pain: he says the leg is not swollen.  He is still having pain on the right side of right foot.  Goes back on the 28th of this month to the orthopedist. He still wears his boot for most of the day.  He tried to put on a shoe yesterday, but it started to swell.  Hasn't been back to work since.  Not taking any pain medications other than ibuprofen once a day, 400mg  a day.  Not taking tylenol.    Health maintenance: He states he would like a Colonoscopy referral.    ROS: See HPI for pertinent positives and negatives  Family history reviewed for today's visit. No changes.  Social history- patient is a current smoker  Objective:   BP (!) 168/82   Pulse 95   Wt 174 lb 9.6 oz (79.2 kg)   SpO2 99%   BMI 28.18 kg/m  Gen: NAD, alert and oriented, cooperative with exam. In a hard boot on right foot.  Msk:  Right foot in boot.  No swelling of right foot/leg.  Sensation intact on right foot. TTP below the ankle on the lateral side of the foot.   Psych: Appropriate behavior  Assessment/Plan:   Hypertension Pt states that neurologist at last appt advised him to stop taking his BP medications so that he could get enrolled in a drug trial.  This was not mentioned in the neurologist's note and I doubt that is what was said to the patient as the only mention of BP stated pt should continue to control BP as part of secondary stroke prevention.  Likely the pt misunderstood what was said, but  regardless of why, the patient has not been taking his BP medication since that visit three weeks ago.  BP today was 168/82.  Advised pt to restart his bp medication, lotrel.   - restart home med - take home bp readings once daily for 7 days and call clinic to report values after 7 days.   - reassess after 7 days.    Contusion of right leg In a hard boot.  Not swollen, TTP below the ankle.  Being followed by ortho and has appt in about 1 week.  Taking ibuprofen once a day.  - advised to take tylenol for first line pain control, then ibuprofen if needed.   Healthcare maintenance Pt desires colonoscopy - amb ref to GI  Clemetine Marker, MD PGY-2 [facility]

## 2019-02-08 DIAGNOSIS — Z1211 Encounter for screening for malignant neoplasm of colon: Secondary | ICD-10-CM | POA: Insufficient documentation

## 2019-02-08 NOTE — Assessment & Plan Note (Signed)
Pt desires colonoscopy - amb ref to GI

## 2019-02-08 NOTE — Telephone Encounter (Signed)
LMOVM for callback. Eric Patterson, CMA  

## 2019-02-08 NOTE — Assessment & Plan Note (Signed)
In a hard boot.  Not swollen, TTP below the ankle.  Being followed by ortho and has appt in about 1 week.  Taking ibuprofen once a day.  - advised to take tylenol for first line pain control, then ibuprofen if needed.

## 2019-02-08 NOTE — Assessment & Plan Note (Signed)
Pt states that neurologist at last appt advised him to stop taking his BP medications so that he could get enrolled in a drug trial.  This was not mentioned in the neurologist's note and I doubt that is what was said to the patient as the only mention of BP stated pt should continue to control BP as part of secondary stroke prevention.  Likely the pt misunderstood what was said, but regardless of why, the patient has not been taking his BP medication since that visit three weeks ago.  BP today was 168/82.  Advised pt to restart his bp medication, lotrel.   - restart home med - take home bp readings once daily for 7 days and call clinic to report values after 7 days.   - reassess after 7 days.

## 2019-02-12 ENCOUNTER — Ambulatory Visit (INDEPENDENT_AMBULATORY_CARE_PROVIDER_SITE_OTHER): Payer: 59 | Admitting: *Deleted

## 2019-02-12 DIAGNOSIS — I639 Cerebral infarction, unspecified: Secondary | ICD-10-CM

## 2019-02-13 ENCOUNTER — Telehealth: Payer: Self-pay | Admitting: *Deleted

## 2019-02-13 DIAGNOSIS — Z0289 Encounter for other administrative examinations: Secondary | ICD-10-CM

## 2019-02-13 LAB — CUP PACEART REMOTE DEVICE CHECK
Date Time Interrogation Session: 20200929085741
Implantable Pulse Generator Implant Date: 20191101

## 2019-02-13 NOTE — Telephone Encounter (Signed)
I receive a disability form from pt that was paid for 50 dollars. I explain to Hilda Blades pt and wife were told by Dr.SEthi at last office visit pts forms will have to be completed by his orthopedic MD or workers comp doctor. PT is currently on workers comp for a injury leg. Darliss Ridgel has been notified and will follow the process of refunding pt his money back of 50 dollars.Per PCP notes on 02/06/2019 pts wife contact their office requesting to change providers at [facility]. Pt is already schedule with Janett Billow NP in December 2020 for a follow up visit. Janett Billow NP has been aware of this of pt on workers comp.Marland Kitchen

## 2019-02-13 NOTE — Telephone Encounter (Signed)
Called pt, left voice mail unable to reach the pt.

## 2019-02-16 ENCOUNTER — Telehealth: Payer: Self-pay

## 2019-02-16 NOTE — Telephone Encounter (Signed)
Patient calls nurse line stating is to return to work on Monday, 10/5. Patient stated he notes to return to work on light duty only. Patient stated he would like to pick this up on Monday. Please advise.

## 2019-02-18 ENCOUNTER — Other Ambulatory Visit: Payer: Self-pay | Admitting: Family Medicine

## 2019-02-18 NOTE — Telephone Encounter (Signed)
There is a letter for him in epic.  If he comes on Monday, someone will have to print it out for him.

## 2019-02-19 NOTE — Telephone Encounter (Signed)
Agree with plan 

## 2019-02-20 NOTE — Telephone Encounter (Signed)
Attempted to call patient to see if he got his note. If he calls back, please ask him. If he needs it still, print from epic and place up front for him.

## 2019-02-21 NOTE — Progress Notes (Signed)
Carelink Summary Report / Loop Recorder 

## 2019-02-27 ENCOUNTER — Other Ambulatory Visit: Payer: Self-pay

## 2019-02-27 ENCOUNTER — Encounter: Payer: Self-pay | Admitting: Family Medicine

## 2019-02-27 ENCOUNTER — Ambulatory Visit (INDEPENDENT_AMBULATORY_CARE_PROVIDER_SITE_OTHER): Payer: 59 | Admitting: Family Medicine

## 2019-02-27 ENCOUNTER — Ambulatory Visit: Payer: 59 | Admitting: Family Medicine

## 2019-02-27 VITALS — BP 140/82 | HR 84 | Wt 173.0 lb

## 2019-02-27 DIAGNOSIS — M545 Low back pain, unspecified: Secondary | ICD-10-CM

## 2019-02-27 DIAGNOSIS — Z1211 Encounter for screening for malignant neoplasm of colon: Secondary | ICD-10-CM | POA: Diagnosis not present

## 2019-02-27 MED ORDER — BACLOFEN 10 MG PO TABS: 10.0000 mg | ORAL_TABLET | Freq: Three times a day (TID) | ORAL | 0 refills | Status: DC

## 2019-02-27 NOTE — Assessment & Plan Note (Signed)
Will write for patient have Cologuard.

## 2019-02-27 NOTE — Progress Notes (Signed)
    Subjective:  Eric Patterson is a 53 y.o. male who presents to the [facility] today with a chief complaint of right lower back pain.   HPI:  Back pain Patient reports that he has had chronic back pain for 5 years, but is been worse over the past month.  It is worse in the right lower back.  It does radiate a little bit down his right hip.  Patient denies any trauma to his back.  Denies any bowel or bladder incontinence.  Denies fevers or chills.  Denies unintentional weight loss.  Patient recently had a piece of machinery from work fall on his right foot leading to a fracture.  Patient is in an immobilizer boot.  This occurred about 1 month ago.  Patient has to ambulate with a cane as well.  Patient has been taking ibuprofen 800 mg once to twice a day for the pain.  He says the pain is usually bad when he wakes up in the morning, then eases off and then gets worse when moving later in the day.  Patient describes the pain is "tight muscles ".  He is also been applying heat.  Patient says that he was going to physical therapy for his foot, but since he has returned to work, he does not have availability to make it to the appointments.  Screen for colon cancer Patient says that he needs to be screened for colon cancer.  He wants to know when he can get his "poop in a box."  ROS: Per HPI   Objective:  Physical Exam: BP 140/82   Pulse 84   Wt 173 lb (78.5 kg)   SpO2 99%   BMI 27.92 kg/m   Gen: NAD, resting comfortably, right foot in fracture immobilizer boot to the knee MSK: 5 of 5 strength in left lower extremity throughout.  Right foot immobilized, normal strength with extension and flexion of the knee, normal sensation throughout left lower extremity, normal sensation throughout right thigh, patient able to ambulate with a cane, able to get up on exam table, right lower back tender to palpation, no gross deformities or overlying ecchymosis Skin: warm, dry Neuro: grossly normal, moves all  extremities Psych: Normal affect and thought content  No results found for this or any previous visit (from the past 72 hour(s)).   Assessment/Plan:  Acute right-sided low back pain without sciatica Patient presenting with back pain that coincided with him getting a foot fracture.  It appears that the patient is compensating with lower back muscles to account for the gait disturbance associated with the boot immobilizer.  Given his description, likely has muscle spasms.  Recommend continued NSAIDs while he has this acute lower back pain, encourage patient to drink lots of fluids while taking these.  We will also add baclofen as a muscle relaxer to assist with muscle spasms.  Patient follow-up if not improved.  Screen for colon cancer Will write for patient have Cologuard.    Lab Orders     Cologuard  Meds ordered this encounter  Medications  . baclofen (LIORESAL) 10 MG tablet    Sig: Take 1 tablet (10 mg total) by mouth 3 (three) times daily.    Dispense:  30 each    Refill:  0      Marny Lowenstein, MD, MS FAMILY MEDICINE RESIDENT - PGY3 02/27/2019 10:29 AM

## 2019-02-27 NOTE — Patient Instructions (Signed)
Acute Back Pain, Adult Acute back pain is sudden and usually short-lived. It is often caused by an injury to the muscles and tissues in the back. The injury may result from:  A muscle or ligament getting overstretched or torn (strained). Ligaments are tissues that connect bones to each other. Lifting something improperly can cause a back strain.  Wear and tear (degeneration) of the spinal disks. Spinal disks are circular tissue that provides cushioning between the bones of the spine (vertebrae).  Twisting motions, such as while playing sports or doing yard work.  A hit to the back.  Arthritis. You may have a physical exam, lab tests, and imaging tests to find the cause of your pain. Acute back pain usually goes away with rest and home care. Follow these instructions at home: Managing pain, stiffness, and swelling  Take over-the-counter and prescription medicines only as told by your health care provider.  Your health care provider may recommend applying ice during the first 24-48 hours after your pain starts. To do this: ? Put ice in a plastic bag. ? Place a towel between your skin and the bag. ? Leave the ice on for 20 minutes, 2-3 times a day.  If directed, apply heat to the affected area as often as told by your health care provider. Use the heat source that your health care provider recommends, such as a moist heat pack or a heating pad. ? Place a towel between your skin and the heat source. ? Leave the heat on for 20-30 minutes. ? Remove the heat if your skin turns bright red. This is especially important if you are unable to feel pain, heat, or cold. You have a greater risk of getting burned. Activity   Do not stay in bed. Staying in bed for more than 1-2 days can delay your recovery.  Sit up and stand up straight. Avoid leaning forward when you sit, or hunching over when you stand. ? If you work at a desk, sit close to it so you do not need to lean over. Keep your chin tucked  in. Keep your neck drawn back, and keep your elbows bent at a right angle. Your arms should look like the letter "L." ? Sit high and close to the steering wheel when you drive. Add lower back (lumbar) support to your car seat, if needed.  Take short walks on even surfaces as soon as you are able. Try to increase the length of time you walk each day.  Do not sit, drive, or stand in one place for more than 30 minutes at a time. Sitting or standing for long periods of time can put stress on your back.  Do not drive or use heavy machinery while taking prescription pain medicine.  Use proper lifting techniques. When you bend and lift, use positions that put less stress on your back: ? Bend your knees. ? Keep the load close to your body. ? Avoid twisting.  Exercise regularly as told by your health care provider. Exercising helps your back heal faster and helps prevent back injuries by keeping muscles strong and flexible.  Work with a physical therapist to make a safe exercise program, as recommended by your health care provider. Do any exercises as told by your physical therapist. Lifestyle  Maintain a healthy weight. Extra weight puts stress on your back and makes it difficult to have good posture.  Avoid activities or situations that make you feel anxious or stressed. Stress and anxiety increase muscle   tension and can make back pain worse. Learn ways to manage anxiety and stress, such as through exercise. General instructions  Sleep on a firm mattress in a comfortable position. Try lying on your side with your knees slightly bent. If you lie on your back, put a pillow under your knees.  Follow your treatment plan as told by your health care provider. This may include: ? Cognitive or behavioral therapy. ? Acupuncture or massage therapy. ? Meditation or yoga. Contact a health care provider if:  You have pain that is not relieved with rest or medicine.  You have increasing pain going down  into your legs or buttocks.  Your pain does not improve after 2 weeks.  You have pain at night.  You lose weight without trying.  You have a fever or chills. Get help right away if:  You develop new bowel or bladder control problems.  You have unusual weakness or numbness in your arms or legs.  You develop nausea or vomiting.  You develop abdominal pain.  You feel faint. Summary  Acute back pain is sudden and usually short-lived.  Use proper lifting techniques. When you bend and lift, use positions that put less stress on your back.  Take over-the-counter and prescription medicines and apply heat or ice as directed by your health care provider. This information is not intended to replace advice given to you by your health care provider. Make sure you discuss any questions you have with your health care provider. Document Released: 05/03/2005 Document Revised: 08/22/2018 Document Reviewed: 12/15/2016 Elsevier Patient Education  2020 [employer] Inc[employer]  

## 2019-02-27 NOTE — Assessment & Plan Note (Signed)
Patient presenting with back pain that coincided with him getting a foot fracture.  It appears that the patient is compensating with lower back muscles to account for the gait disturbance associated with the boot immobilizer.  Given his description, likely has muscle spasms.  Recommend continued NSAIDs while he has this acute lower back pain, encourage patient to drink lots of fluids while taking these.  We will also add baclofen as a muscle relaxer to assist with muscle spasms.  Patient follow-up if not improved.

## 2019-03-18 LAB — CUP PACEART REMOTE DEVICE CHECK
Date Time Interrogation Session: 20201101120545
Implantable Pulse Generator Implant Date: 20191101

## 2019-03-19 ENCOUNTER — Ambulatory Visit (INDEPENDENT_AMBULATORY_CARE_PROVIDER_SITE_OTHER): Payer: 59 | Admitting: *Deleted

## 2019-03-19 DIAGNOSIS — I639 Cerebral infarction, unspecified: Secondary | ICD-10-CM

## 2019-04-11 NOTE — Progress Notes (Signed)
Carelink Summary Report / Loop Recorder 

## 2019-04-17 ENCOUNTER — Encounter: Payer: Self-pay | Admitting: Adult Health

## 2019-04-17 ENCOUNTER — Ambulatory Visit (INDEPENDENT_AMBULATORY_CARE_PROVIDER_SITE_OTHER): Payer: 59 | Admitting: Adult Health

## 2019-04-17 ENCOUNTER — Other Ambulatory Visit: Payer: Self-pay

## 2019-04-17 VITALS — BP 132/88 | HR 87 | Temp 97.7°F | Ht 66.0 in | Wt 174.8 lb

## 2019-04-17 DIAGNOSIS — I10 Essential (primary) hypertension: Secondary | ICD-10-CM

## 2019-04-17 DIAGNOSIS — Z720 Tobacco use: Secondary | ICD-10-CM

## 2019-04-17 DIAGNOSIS — I639 Cerebral infarction, unspecified: Secondary | ICD-10-CM

## 2019-04-17 DIAGNOSIS — E785 Hyperlipidemia, unspecified: Secondary | ICD-10-CM

## 2019-04-17 NOTE — Progress Notes (Signed)
[facility] [address] [place]. [place] [id number] [phone]       OFFICE FOLLOW-UP NOTE  [name] Date of Birth:  07/09/1965 Medical Record Number:  [id number]   HPI:   Initial visit 04/05/2018 Dr. Sethi:Eric Patterson is a 53 year old African-American male seen today for initial office follow-up visit following hospital admission for stroke in October 2019.  He is accompanied by his wife.  History is obtained from them and review of electronic medical records.  I personally reviewed imaging films.  He presented with sudden onset of right facial weakness upon arising on 03/15/2018.  He was also noted dragging his right leg.  He did have a prior history of stroke and had some residual right-sided weakness but this appeared to be more than baseline.  He was not a candidate for TPA as he presented outside the window.  CT scan of the head showed large acute left basal ganglia infarct.  MRI scan of the brain confirmed a large left basal ganglia infarct as well as showed an old right posterior corona radiata lacunar.  MRA of the brain showed mild to moderate intracranial atherosclerotic changes with short segment stenosis of the left M2 segment.  Carotid Doppler showed no significant extracranial stenosis.  Transthoracic echo showed normal ejection fraction.  Transesophageal echocardiogram was also normal.  LDL cholesterol was slightly elevated at 101 mg percent.  Hemoglobin A1c was 5.3.  Patient stroke was felt to be cryptogenic and he underwent loop recorder insertion and so for paroxysmal atrial fibrillation has not yet been found.  Urine drug screen was negative.  Patient was discharged home on aspirin and Plavix and states he is tolerated both medications well without bleeding or bruising.  He is participating in outpatient physical and occupational therapy.  He feels he is slowly returning back to normal.  He plans to return to work a month later.  He has tried to quit smoking  but has managed to cut back but not quit completely yet but plans to do so with the help of his primary physician.  He has no new complaints today.  There is no family history of strokes or heart attacks in a young age.  He has remote history of syphilis in 2016 but states he was treated for that.  He denies any history of DVT or pulmonary embolism. Update 01/11/2019 Dr. Pearlean BrownieSethi: Patient is seen today after last visit in November 2019.  He is accompanied by his wife.  He was recently readmitted with a stroke.  He presented on 12/21/2018 to [facility] emergency room with sudden onset of right arm numbness and weakness.  His blood pressure was elevated elevated at 183/98 in the ED.  He presented outside time window for thrombolysis.  MRI scan showed patchy left posterior MCA occipital and posterior frontal lobe infarcts without hemorrhage.  MRA showed only mild atherosclerosis involving both internal carotid arteries and MCA branches.  No large vessel stenosis or occlusion.  MRI of the neck showed no significant extracranial stenosis in the neck.  2D echo showed normal ejection fraction without cardiac source of embolism.  Patient had previously loop recorder inserted following his last stroke in November 2019 and no atrial fibrillation was found on interrogation.  Patient's RPR titer was low 1 is to 2 which was felt not to be significant.  Hypercoagulable labs showed only slightly elevated IgG for antiphospholipid antibodies.  Lupus anticoagulant was negative.  LDL was 66 mg percent.  Hemoglobin A1c was  5.5.  Patient is started on dual antiplatelet therapy aspirin and Plavix and discharged home with physical occupational therapy.  Patient states that his therapy is ongoing.  He feels his right upper extremity strength is improved but he still has some numbness and tingling in his fingers.  Patient has a cast in his right foot due to work demands compensation injury.  He is using wheelchair for outdoors and long  distances.  He is awaiting delivery of her walker that he can use at home.  He has filed for [employer] and is thinking about whether he can qualify for disability.  He has no new complaints today.  He is tolerating aspirin and Plavix with minor bruising but no bleeding.  His blood pressure is under good control and today it is 137/93.  He is tolerating Lipitor well without muscle aches and pains.  He is trying to quit smoking.  Update 04/17/2019: Eric Patterson is a 53 year old male who is being seen today for stroke follow-up accompanied by his wife.  He has been doing well from a stroke standpoint with mild residual deficits of decreased right hand dexterity but overall improving.  He continues to participate in physical therapy due to prior work injury of left foot.  He called office in September requesting disability/Workmen's Comp. paperwork due to prior work injury but this was denied as injury/complaints not stroke related.  He has since returned back to work on light duty without difficulty.  Wife concerned regarding short-term memory concerns such as forgetting to lock the door or leaving the stove on.  Patient does endorse recent worsening of anxiety with increased life stressors.  He has not been to his PCP regarding these concerns at this time.  He also has concerns regarding occasional urge incontinence.  Continues on aspirin 81 mg without bleeding or bruising.  Continues on atorvastatin 40 mg daily without myalgias.  Blood pressure today initially elevated but on recheck 132/88.  Loop recorder has not shown atrial fibrillation thus far.  No concerns at this time.    ROS:   14 system review of systems is positive for gait difficulty, right leg pain, leg weakness and all other systems negative PMH:  Past Medical History:  Diagnosis Date   Foot injury 12/07/2018   Hyperlipidemia    Hypertension    Stroke (HCC) 2017   R side weakness deficits    Social History:  Social  History   Socioeconomic History   Marital status: Married    Spouse name: Not on file   Number of children: 1   Years of education: 12   Highest education level: Not on file  Occupational History   Occupation: Environmental consultant strain: Not on file   Food insecurity    Worry: Not on file    Inability: Not on file   Transportation needs    Medical: Not on file    Non-medical: Not on file  Tobacco Use   Smoking status: Light Tobacco Smoker    Packs/day: 0.25    Types: Cigarettes   Smokeless tobacco: Never Used   Tobacco comment: smoke a whole week in a pack  Substance and Sexual Activity   Alcohol use: Not Currently   Drug use: No   Sexual activity: Not on file  Lifestyle   Physical activity    Days per week: Not on file    Minutes per session: Not on file   Stress: Not on file  Relationships   Social Musician on phone: Not on file    Gets together: Not on file    Attends religious service: Not on file    Active member of club or organization: Not on file    Attends meetings of clubs or organizations: Not on file    Relationship status: Not on file   Intimate partner violence    Fear of current or ex partner: Not on file    Emotionally abused: Not on file    Physically abused: Not on file    Forced sexual activity: Not on file  Other Topics Concern   Not on file  Social History Narrative   Fun/Hobby: Work on his truck     Medications:   Current Outpatient Medications on File Prior to Visit  Medication Sig Dispense Refill   acetaminophen (TYLENOL) 325 MG tablet Take 650 mg by mouth every 6 (six) hours as needed.     amLODipine-benazepril (LOTREL) 10-20 MG capsule Take 1 capsule by mouth daily. 90 capsule 3   aspirin EC 81 MG tablet Take 81 mg by mouth daily.     atorvastatin (LIPITOR) 40 MG tablet Take 1 tablet (40 mg total) by mouth daily at 6 PM. 30 tablet 0   gabapentin (NEURONTIN) 300 MG  capsule Take 300 mg by mouth 2 (two) times daily as needed (for pain).      nicotine (NICODERM CQ - DOSED IN MG/24 HOURS) 21 mg/24hr patch Place 1 patch (21 mg total) onto the skin daily. 7 patch 0   nicotine polacrilex (NICOTINE MINI) 2 MG lozenge Take 1 lozenge (2 mg total) by mouth as needed for smoking cessation. 100 tablet 0   No current facility-administered medications on file prior to visit.     Allergies:  No Known Allergies  Physical Exam  Today's Vitals   04/17/19 1050  BP: 132/88  Pulse: 87  Temp: 97.7 F (36.5 C)  Weight: 174 lb 12.8 oz (79.3 kg)  Height:  (1.676 m)   Body mass index is 28.21 kg/m.    General: well developed, well nourished middle-aged African-American male, seated, in no evident distress Head: head normocephalic and atraumatic.  Neck: supple with no carotid or supraclavicular bruits Cardiovascular: regular rate and rhythm, no murmurs Musculoskeletal: no deformity.   Skin:  no rash/petichiae Vascular:  Normal pulses all extremities  Neurologic Exam Mental Status: Awake and fully alert. Oriented to place and time. Recent and remote memory intact. Attention span, concentration and fund of knowledge appropriate. Mood and affect appropriate.  Cranial Nerves: Pupils equal, briskly reactive to light. Extraocular movements full without nystagmus. Visual fields full to confrontation. Hearing intact. Facial sensation intact. Face, tongue, palate moves normally and symmetrically.  Motor: Normal bulk and tone. Normal strength in all tested extremity muscles.  Slightly decreased right hand finger dexterity Sensory.: intact to touch ,pinprick .position and vibratory sensation.  Coordination: Rapid alternating movements normal in all extremities except slightly decreased right hand. Finger-to-nose and heel-to-shin performed accurately bilaterally. Gait and Station: Stand from seated position without difficulty.  Ambulates without assistive device with  normal stride length and balance with mild favoring of right leg due to prior injury Reflexes: 1+ and symmetric. Toes downgoing.      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  Loop recorder placed  to assess for atrial fibrillation which has not shown atrial fibrillation thus far.  Recovered well from a stroke standpoint with residual mild right hand dexterity.  Wife expresses concern regarding mild short-term memory concerns with patient reporting increased anxiety and agitation recently     PLAN: -Advised to continue aspirin 81 mg daily and atorvastatin 40 mg daily for secondary stroke prevention -Advised to continue to follow with PCP for HTN and HLD management along with discussion regarding management for anxiety and stress reduction techniques likely contributing to short-term memory concerns -Concerns of likely urge incontinence and advised to follow-up with PCP -Highly encourage smoking cessation -Loop recorder will continue to be monitored -maintain strict control of hypertension with blood pressure goal below 130/90, diabetes with hemoglobin A1c goal below 6.5% and lipids with LDL cholesterol goal below 70 mg/dL. I also advised the patient to eat a healthy diet with plenty of whole grains, cereals, fruits and vegetables, exercise regularly and maintain ideal body weight I encouraged him to continue to quit smoking.   Consider  particpation in [place] trial for cryptogenic stroke if interested..  Overall stable from stroke standpoint recommend follow-up as needed  Greater than 50% of time during this 25 minute visit was spent on counseling,explanation of diagnosis of cryptogenic stroke, planning of further management, discussion with patient and family and coordination of care  Frann Rider,  [facility]  [facility] [address] [place],  [address]  Phone [phone] Fax [phone] Note: This document was prepared with digital dictation and possible smart phrase technology. Any transcriptional errors that result from this process are unintentional.

## 2019-04-17 NOTE — Patient Instructions (Addendum)
Continue aspirin 81 mg daily  and Lipitor for secondary stroke prevention  Continue to follow up with PCP regarding cholesterol and blood pressure management  Also recommend following up with your primary doctor in regards to your voiding concerns and treatment for anxiety/depression that is likely causing short-term memory concerns  Continue to participate in therapies for ongoing improvement of your right foot  Highly encouraged complete smoking cessation  Continue to monitor blood pressure at home  Loop recorder will continue to be monitored  Maintain strict control of hypertension with blood pressure goal below 130/90, diabetes with hemoglobin A1c goal below 6.5% and cholesterol with LDL cholesterol (bad cholesterol) goal below 70 mg/dL. I also advised the patient to eat a healthy diet with plenty of whole grains, cereals, fruits and vegetables, exercise regularly and maintain ideal body weight.        Thank you for coming to see [place] at [facility]. I hope we have been able to provide you high quality care today.  You may receive a patient satisfaction survey over the next few weeks. We would appreciate your feedback and comments so that we may continue to improve ourselves and the health of our patients.

## 2019-04-18 NOTE — Progress Notes (Signed)
I agree with the above plan 

## 2019-04-20 ENCOUNTER — Ambulatory Visit (INDEPENDENT_AMBULATORY_CARE_PROVIDER_SITE_OTHER): Payer: 59 | Admitting: *Deleted

## 2019-04-20 DIAGNOSIS — I639 Cerebral infarction, unspecified: Secondary | ICD-10-CM

## 2019-04-21 LAB — CUP PACEART REMOTE DEVICE CHECK
Date Time Interrogation Session: 20201204095521
Implantable Pulse Generator Implant Date: 20191101

## 2019-05-04 ENCOUNTER — Ambulatory Visit: Payer: 59 | Admitting: Family Medicine

## 2019-05-23 ENCOUNTER — Ambulatory Visit (INDEPENDENT_AMBULATORY_CARE_PROVIDER_SITE_OTHER): Payer: 59 | Admitting: *Deleted

## 2019-05-23 DIAGNOSIS — I639 Cerebral infarction, unspecified: Secondary | ICD-10-CM | POA: Diagnosis not present

## 2019-05-24 LAB — CUP PACEART REMOTE DEVICE CHECK
Date Time Interrogation Session: 20210106100013
Implantable Pulse Generator Implant Date: 20191101

## 2019-06-25 ENCOUNTER — Ambulatory Visit (INDEPENDENT_AMBULATORY_CARE_PROVIDER_SITE_OTHER): Payer: 59 | Admitting: *Deleted

## 2019-06-25 DIAGNOSIS — I639 Cerebral infarction, unspecified: Secondary | ICD-10-CM

## 2019-06-25 LAB — CUP PACEART REMOTE DEVICE CHECK
Date Time Interrogation Session: 20210207234455
Implantable Pulse Generator Implant Date: 20191101

## 2019-06-26 NOTE — Progress Notes (Signed)
ILR Remote 

## 2019-07-11 ENCOUNTER — Emergency Department (HOSPITAL_COMMUNITY)
Admission: EM | Admit: 2019-07-11 | Discharge: 2019-07-11 | Disposition: A | Discharge status: Home / Self Care | Payer: Self-pay | Attending: Emergency Medicine | Admitting: Emergency Medicine

## 2019-07-11 ENCOUNTER — Encounter (HOSPITAL_COMMUNITY): Payer: Self-pay | Admitting: *Deleted

## 2019-07-11 ENCOUNTER — Other Ambulatory Visit: Payer: Self-pay

## 2019-07-11 DIAGNOSIS — Z79899 Other long term (current) drug therapy: Secondary | ICD-10-CM | POA: Insufficient documentation

## 2019-07-11 DIAGNOSIS — T783XXA Angioneurotic edema, initial encounter: Secondary | ICD-10-CM | POA: Insufficient documentation

## 2019-07-11 DIAGNOSIS — Z7982 Long term (current) use of aspirin: Secondary | ICD-10-CM | POA: Insufficient documentation

## 2019-07-11 DIAGNOSIS — I69351 Hemiplegia and hemiparesis following cerebral infarction affecting right dominant side: Secondary | ICD-10-CM | POA: Insufficient documentation

## 2019-07-11 DIAGNOSIS — I10 Essential (primary) hypertension: Secondary | ICD-10-CM | POA: Insufficient documentation

## 2019-07-11 DIAGNOSIS — Z720 Tobacco use: Secondary | ICD-10-CM | POA: Insufficient documentation

## 2019-07-11 LAB — I-STAT CREATININE, ED: Creatinine, Ser: 1.3 mg/dL — ABNORMAL HIGH (ref 0.61–1.24)

## 2019-07-11 MED ORDER — DIPHENHYDRAMINE HCL 50 MG/ML IJ SOLN
25.0000 mg | Freq: Once | INTRAMUSCULAR | Status: AC
  Administered 2019-07-11: 25 mg via INTRAVENOUS
  Filled 2019-07-11: qty 1

## 2019-07-11 MED ORDER — DIPHENHYDRAMINE HCL 25 MG PO TABS: 25.0000 mg | ORAL_TABLET | Freq: Four times a day (QID) | ORAL | 0 refills | Status: DC

## 2019-07-11 MED ORDER — PREDNISONE 20 MG PO TABS
60.0000 mg | ORAL_TABLET | Freq: Once | ORAL | Status: AC
  Administered 2019-07-11: 19:00:00 60 mg via ORAL
  Filled 2019-07-11: qty 3

## 2019-07-11 MED ORDER — PREDNISONE 50 MG PO TABS: 50.0000 mg | ORAL_TABLET | Freq: Every day | ORAL | 0 refills | Status: DC

## 2019-07-11 MED ORDER — AMLODIPINE BESYLATE 10 MG PO TABS: 10.0000 mg | ORAL_TABLET | Freq: Every day | ORAL | 0 refills | Status: DC

## 2019-07-11 NOTE — Discharge Instructions (Signed)
Stop taking the combination drug benazepril amlodipine.  The benazepril is the likely cause of your lip swelling.  Follow-up with your doctor to make sure your blood pressure

## 2019-07-11 NOTE — ED Provider Notes (Signed)
[facility] EMERGENCY DEPARTMENT Provider Note   CSN: [id number] Arrival date & time: 07/11/19  1551     History Chief Complaint  Patient presents with  . Allergic Reaction    Eric Patterson is a 54 y.o. male.  HPI   Pt started noticing lip swelling.  He noticed it  Today.  He feels a knot in his throat when he swallows.  No difficulty breathing.  No fevers or chills. No hives.   He had one episode in the past with other medications.  He is on amlodipin   benaz 10/20  Past Medical History:  Diagnosis Date  . Foot injury 12/07/2018  . Hyperlipidemia   . Hypertension   . Stroke [facility]) 2017   R side weakness deficits    Patient Active Problem List   Diagnosis Date Noted  . Acute right-sided low back pain without sciatica 02/27/2019  . Screen for colon cancer 02/08/2019  . Contusion of right leg 01/06/2019  . Exposure to COVID-19 virus 01/05/2019  . Hyperkalemia 12/21/2018  . CVA (cerebral vascular accident) ([place]) 12/21/2018  . Elevated blood pressure reading with diagnosis of hypertension   . Tobacco use 03/16/2018  . Stroke ([place]) 03/16/2018  . Infarction of left basal ganglia ([place]) 03/15/2018  . Hyperlipidemia LDL goal <70 10/21/2016  . Chronic left-sided low back pain without sciatica 10/21/2016  . Hypertension 11/28/2015  . Lacunar infarction ([place]) 11/28/2015    Past Surgical History:  Procedure Laterality Date  . LOOP RECORDER INSERTION N/A 03/17/2018   Procedure: LOOP RECORDER INSERTION;  Surgeon: Constance Haw, MD;  Location: [facility];  Service: Cardiovascular;  Laterality: N/A;  . TEE WITHOUT CARDIOVERSION N/A 03/17/2018   Procedure: TRANSESOPHAGEAL ECHOCARDIOGRAM (TEE);  Surgeon: Josue Hector, MD;  Location: [facility] ENDOSCOPY;  Service: Cardiovascular;  Laterality: N/A;       Family History  Problem Relation Age of Onset  . Heart attack Mother   . Stroke Mother   . Stroke Brother 77  . Stroke Other     Social History    Tobacco Use  . Smoking status: Light Tobacco Smoker    Packs/day: 0.25    Types: Cigarettes  . Smokeless tobacco: Never Used  . Tobacco comment: smoke a whole week in a pack  Substance Use Topics  . Alcohol use: Yes    Alcohol/week: 7.0 standard drinks    Types: 7 Cans of beer per week  . Drug use: No    Home Medications Prior to Admission medications   Medication Sig Start Date End Date Taking? Authorizing Provider  acetaminophen (TYLENOL) 325 MG tablet Take 650 mg by mouth every 6 (six) hours as needed for mild pain or headache.    Yes [provider]  aspirin 81 MG chewable tablet Chew 81 mg by mouth daily.   Yes [provider]  atorvastatin (LIPITOR) 40 MG tablet Take 1 tablet (40 mg total) by mouth daily at 6 PM. 03/17/18  Yes Domenic Polite, MD  gabapentin (NEURONTIN) 300 MG capsule Take 300 mg by mouth 2 (two) times daily as needed (for pain).  12/19/18  Yes [provider]  nicotine (NICODERM CQ - DOSED IN MG/24 HOURS) 21 mg/24hr patch Place 1 patch (21 mg total) onto the skin daily. Patient taking differently: Place 21 mg onto the skin daily as needed (for smoking cessation).  12/22/18  Yes Debbe Odea, MD  nicotine polacrilex (NICOTINE MINI) 2 MG lozenge Take 1 lozenge (2 mg total)  by mouth as needed for smoking cessation. 01/03/19  Yes Sandre Kitty, MD  amLODipine (NORVASC) 10 MG tablet Take 1 tablet (10 mg total) by mouth daily. 07/11/19   Linwood Dibbles, MD  diphenhydrAMINE (BENADRYL) 25 MG tablet Take 1 tablet (25 mg total) by mouth every 6 (six) hours. 07/11/19   Linwood Dibbles, MD  predniSONE (DELTASONE) 50 MG tablet Take 1 tablet (50 mg total) by mouth daily. 07/11/19   Linwood Dibbles, MD  amLODipine-benazepril (LOTREL) 10-20 MG capsule Take 1 capsule by mouth daily. 02/06/19 07/11/19  Sandre Kitty, MD    Allergies    Benazepril, Aleve [naproxen sodium], and Shellfish-derived products  Review of Systems   Review of Systems  All other systems  reviewed and are negative.   Physical Exam Updated Vital Signs BP (!) 165/128   Pulse 90   Temp 98.1 F (36.7 C) (Oral)   Resp 16   Ht 1.727 m (5\' 8" )   Wt 77.1 kg   SpO2 100%   BMI 25.85 kg/m   Physical Exam Vitals and nursing note reviewed.  Constitutional:      General: He is not in acute distress.    Appearance: He is well-developed.  HENT:     Head: Normocephalic and atraumatic.     Comments: Angioedema of the upper lip, no evidence of tongue swelling, no swelling of the posterior oropharynx, patient is able to handle his secretions, he is speaking clearly    Right Ear: External ear normal.     Left Ear: External ear normal.  Eyes:     General: No scleral icterus.       Right eye: No discharge.        Left eye: No discharge.     Conjunctiva/sclera: Conjunctivae normal.  Neck:     Trachea: No tracheal deviation.  Cardiovascular:     Rate and Rhythm: Normal rate and regular rhythm.  Pulmonary:     Effort: Pulmonary effort is normal. No respiratory distress.     Breath sounds: Normal breath sounds. No stridor. No wheezing or rales.  Abdominal:     General: Bowel sounds are normal. There is no distension.     Palpations: Abdomen is soft.     Tenderness: There is no abdominal tenderness. There is no guarding or rebound.  Musculoskeletal:        General: No tenderness.     Cervical back: Neck supple.  Skin:    General: Skin is warm and dry.     Findings: No rash.  Neurological:     Mental Status: He is alert.     Cranial Nerves: No cranial nerve deficit (no facial droop, extraocular movements intact, no slurred speech).     Sensory: No sensory deficit.     Motor: No abnormal muscle tone or seizure activity.     Coordination: Coordination normal.     ED Results / Procedures / Treatments   Labs (all labs ordered are listed, but only abnormal results are displayed) Labs Reviewed  I-STAT CREATININE, ED - Abnormal; Notable for the following components:       Result Value   Creatinine, Ser 1.30 (*)    All other components within normal limits    EKG None  Radiology No results found.  Procedures Procedures (including critical care time)  Medications Ordered in ED Medications  diphenhydrAMINE (BENADRYL) injection 25 mg (25 mg Intravenous Given 07/11/19 1927)  predniSONE (DELTASONE) tablet 60 mg (60 mg Oral Given 07/11/19 1922)  ED Course  I have reviewed the triage vital signs and the nursing notes.  Pertinent labs & imaging results that were available during my care of the patient were reviewed by me and considered in my medical decision making (see chart for details).  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  Patient had isolated swelling of the lip.  No evidence of airway compromise.  He was monitored in the ED and did not have any worsening symptoms.  This is likely related to his benazepril but I will start him on diphenhydramine and prednisone in case there is some sort of allergic type trigger.  I will give him a prescription for Norvasc alone.  Follow up with PCP Final Clinical Impression(s) / ED Diagnoses Final diagnoses:  Angioedema of lips, initial encounter    Rx / DC Orders ED Discharge Orders         Ordered    amLODipine (NORVASC) 10 MG tablet  Daily     07/11/19 2047    diphenhydrAMINE (BENADRYL) 25 MG tablet  Every 6 hours     07/11/19 2048    predniSONE (DELTASONE) 50 MG tablet  Daily     07/11/19 2048           Linwood Dibbles, MD 07/11/19 2049

## 2019-07-11 NOTE — ED Triage Notes (Signed)
Pt has been taking amlodipine x 5 years.  He took it last night and this am he woke up with an upper swollen lip and feeling as if he had a lump in his throat.  Airway patent.  Upper lip swollen.  VS stable.

## 2019-07-26 ENCOUNTER — Ambulatory Visit (INDEPENDENT_AMBULATORY_CARE_PROVIDER_SITE_OTHER): Payer: 59 | Admitting: *Deleted

## 2019-07-26 DIAGNOSIS — I639 Cerebral infarction, unspecified: Secondary | ICD-10-CM

## 2019-07-26 LAB — CUP PACEART REMOTE DEVICE CHECK
Date Time Interrogation Session: 20210311001633
Implantable Pulse Generator Implant Date: 20191101

## 2019-07-27 NOTE — Progress Notes (Signed)
ILR Remote 

## 2019-08-27 ENCOUNTER — Ambulatory Visit (INDEPENDENT_AMBULATORY_CARE_PROVIDER_SITE_OTHER): Payer: 59 | Admitting: *Deleted

## 2019-08-27 DIAGNOSIS — I639 Cerebral infarction, unspecified: Secondary | ICD-10-CM

## 2019-08-27 LAB — CUP PACEART REMOTE DEVICE CHECK
Date Time Interrogation Session: 20210411032936
Implantable Pulse Generator Implant Date: 20191101

## 2019-08-28 ENCOUNTER — Other Ambulatory Visit: Payer: Self-pay

## 2019-08-28 NOTE — Progress Notes (Signed)
ILR Remote 

## 2019-08-28 NOTE — Telephone Encounter (Signed)
Patient's wife calls nurse line requesting refill on BP medication. Patient has follow up appointment scheduled on 5/11 with PCP.   Veronda Prude, RN

## 2019-08-29 MED ORDER — AMLODIPINE BESYLATE 10 MG PO TABS: 10.0000 mg | ORAL_TABLET | Freq: Every day | ORAL | 0 refills | Status: DC

## 2019-09-25 ENCOUNTER — Ambulatory Visit: Payer: Self-pay | Admitting: Family Medicine

## 2019-09-25 NOTE — Progress Notes (Deleted)
    SUBJECTIVE:   CHIEF COMPLAINT / HPI:   Med rec:   Foot:   Colonoscopy: cologuard?   PERTINENT  PMH / PSH: ***  OBJECTIVE:   There were no vitals taken for this visit.  ***  ASSESSMENT/PLAN:   No problem-specific Assessment & Plan notes found for this encounter.     Sandre Kitty, MD [facility]

## 2019-09-27 LAB — CUP PACEART REMOTE DEVICE CHECK
Date Time Interrogation Session: 20210512032249
Implantable Pulse Generator Implant Date: 20191101

## 2019-10-01 ENCOUNTER — Ambulatory Visit (INDEPENDENT_AMBULATORY_CARE_PROVIDER_SITE_OTHER): Payer: Self-pay | Admitting: *Deleted

## 2019-10-01 DIAGNOSIS — I639 Cerebral infarction, unspecified: Secondary | ICD-10-CM

## 2019-10-02 NOTE — Progress Notes (Signed)
Carelink Summary Report / Loop Recorder 

## 2019-10-03 ENCOUNTER — Telehealth: Payer: Self-pay

## 2019-10-03 NOTE — Telephone Encounter (Signed)
Left message for patient to inform of disconnected monitor. 

## 2020-04-15 ENCOUNTER — Other Ambulatory Visit: Payer: Self-pay | Admitting: Family Medicine

## 2020-04-15 NOTE — Telephone Encounter (Signed)
Can we call this pt to have him schedule an appointment with me? This Blood pressure medication he asked for has been discontinued due to allergy and I'd like to talk to him about alternative BP options.

## 2020-04-15 NOTE — Telephone Encounter (Signed)
Called patient to schedule but the number in the chart is not in service.

## 2021-04-02 ENCOUNTER — Other Ambulatory Visit: Payer: Self-pay

## 2021-04-02 ENCOUNTER — Encounter: Payer: Self-pay | Admitting: Family Medicine

## 2021-04-02 ENCOUNTER — Ambulatory Visit (INDEPENDENT_AMBULATORY_CARE_PROVIDER_SITE_OTHER): Payer: Self-pay | Admitting: Family Medicine

## 2021-04-02 ENCOUNTER — Ambulatory Visit (INDEPENDENT_AMBULATORY_CARE_PROVIDER_SITE_OTHER): Payer: BC Managed Care – PPO

## 2021-04-02 VITALS — BP 217/121 | HR 83 | Wt 170.8 lb

## 2021-04-02 DIAGNOSIS — I10 Essential (primary) hypertension: Secondary | ICD-10-CM

## 2021-04-02 DIAGNOSIS — I639 Cerebral infarction, unspecified: Secondary | ICD-10-CM

## 2021-04-02 DIAGNOSIS — Z23 Encounter for immunization: Secondary | ICD-10-CM | POA: Diagnosis not present

## 2021-04-02 DIAGNOSIS — Z720 Tobacco use: Secondary | ICD-10-CM

## 2021-04-02 DIAGNOSIS — Z1211 Encounter for screening for malignant neoplasm of colon: Secondary | ICD-10-CM

## 2021-04-02 DIAGNOSIS — Z1159 Encounter for screening for other viral diseases: Secondary | ICD-10-CM

## 2021-04-02 DIAGNOSIS — E785 Hyperlipidemia, unspecified: Secondary | ICD-10-CM

## 2021-04-02 MED ORDER — ASPIRIN 81 MG PO CHEW: 81.0000 mg | CHEWABLE_TABLET | Freq: Every day | ORAL | 3 refills | Status: DC

## 2021-04-02 MED ORDER — ATORVASTATIN CALCIUM 40 MG PO TABS
40.0000 mg | ORAL_TABLET | Freq: Every day | ORAL | 0 refills | Status: DC
Start: 2021-04-02 — End: 2021-06-05

## 2021-04-02 MED ORDER — HYDROCHLOROTHIAZIDE 12.5 MG PO CAPS: 12.5000 mg | ORAL_CAPSULE | Freq: Every day | ORAL | 0 refills | Status: DC

## 2021-04-02 MED ORDER — ATORVASTATIN CALCIUM 40 MG PO TABS: 40.0000 mg | ORAL_TABLET | Freq: Every day | ORAL | 0 refills | Status: DC

## 2021-04-02 MED ORDER — NICOTINE 7 MG/24HR TD PT24: 7.0000 mg | MEDICATED_PATCH | Freq: Every day | TRANSDERMAL | 2 refills | Status: DC

## 2021-04-02 MED ORDER — NICOTINE POLACRILEX 2 MG MT LOZG: 2.0000 mg | LOZENGE | OROMUCOSAL | 0 refills | Status: DC | PRN

## 2021-04-02 MED ORDER — AMLODIPINE BESYLATE 10 MG PO TABS: 10.0000 mg | ORAL_TABLET | Freq: Every day | ORAL | 0 refills | Status: DC

## 2021-04-02 NOTE — Assessment & Plan Note (Signed)
Severe range HTN in office, asymptomatic.  Exam unremarkable, low suspicion for complications such as CVA, aortic dissection at this time.  Has been out of his medications for 1 year due to lapse in insurance.  Restarted amlodipine and added HCTZ 12.5 mg.  Strict ED precautions given.  Check BMP today.  Follow-up with myself scheduled 11/28.

## 2021-04-02 NOTE — Progress Notes (Signed)
    SUBJECTIVE:   CHIEF COMPLAINT / HPI: Follow-up HTN, medication refill  Patient states he has not had any of his medications since 2021 due to lapse in health insurance.  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  He was previously taking amlodipine, atorvastatin, baby aspirin, nicotine patch and lozenge.  He states he has occasionally been taking his daughters blood pressure medication.  He did have a headache earlier in the week but denies any symptoms whatsoever currently.  Denies headache, chest pain, back pain, shortness of breath, weakness, blurred vision.  He does not have a BP cuff at home but they are working on getting one next month.  He wants to restart his nicotine patch and lozenge.  Here with wife Okey Regal.  She has lung cancer and bladder cancer and has been trying to get patient to quit smoking.  PERTINENT  PMH / PSH: HTN, CVA, tobacco use (0.5 ppd)  OBJECTIVE:   BP (!) 217/121   Pulse 83   Wt 170 lb 12.8 oz (77.5 kg)   SpO2 100%   BMI 25.97 kg/m   General: Well-appearing middle-age male, NAD CV: RRR, no murmurs Pulm: CTAB, no wheezes or rales Neuro: CN II through XII intact, full strength in upper and lower extremities, gross sensation intact  ASSESSMENT/PLAN:   Hyperlipidemia LDL goal <70 Refill statin, lipid panel today  Tobacco use Currently 0.5 packs/day smoker.  Restarted nicotine patch and as needed nicotine lozenge.  Hypertension Severe range HTN in office, asymptomatic.  Exam unremarkable, low suspicion for complications such as CVA, aortic dissection at this time.  Has been out of his medications for 1 year due to lapse in insurance.  Restarted amlodipine and added HCTZ 12.5 mg.  Strict ED precautions given.  Check BMP today.  Follow-up with myself scheduled 11/28.  CVA (cerebral vascular accident) (HCC) Refilled statin and aspirin.   HCM - GI referral for colonoscopy - HCV screening - Covid bivalent booster given - flu shot  given  Littie Deeds, MD [facility]

## 2021-04-02 NOTE — Assessment & Plan Note (Signed)
Refill statin, lipid panel today

## 2021-04-02 NOTE — Patient Instructions (Addendum)
It was nice seeing you today!  Take amlodipine 10 mg and HCTZ 12.5 mg every day.  Please call or go to the ED to be evaluated if you develop symptoms of headache, blurred vision, chest pain, back pain, shortness of breath, weakness, speech changes, facial droop.  Referral placed for colonoscopy. They will give you a call for appointment.  Use nicotine patch daily and nicotine lozenge as needed.  Follow-up in 1 week for BP re-check. We will review blood work then or I will call you sooner if needed.  Please arrive at least 15 minutes prior to your scheduled appointments.  Stay well, Littie Deeds, MD [facility] [phone]

## 2021-04-02 NOTE — Assessment & Plan Note (Signed)
Refilled statin and aspirin.

## 2021-04-02 NOTE — Progress Notes (Signed)
   Covid-19 Vaccination Clinic  Name:  [name]    MRN: [id number] DOB: May 09, 1966  04/02/2021  Mr. Leyendecker was observed post Covid-19 immunization for 15 minutes without incident. He was provided with Vaccine Information Sheet and instruction to access the V-Safe system.   Mr. Channing was instructed to call 911 with any severe reactions post vaccine: Difficulty breathing  Swelling of face and throat  A fast heartbeat  A bad rash all over body  Dizziness and weakness

## 2021-04-02 NOTE — Assessment & Plan Note (Signed)
Currently 0.5 packs/day smoker.  Restarted nicotine patch and as needed nicotine lozenge.

## 2021-04-03 LAB — BASIC METABOLIC PANEL
BUN/Creatinine Ratio: 15 (ref 9–20)
BUN: 17 mg/dL (ref 6–24)
CO2: 23 mmol/L (ref 20–29)
Calcium: 9.7 mg/dL (ref 8.7–10.2)
Chloride: 106 mmol/L (ref 96–106)
Creatinine, Ser: 1.16 mg/dL (ref 0.76–1.27)
Glucose: 86 mg/dL (ref 70–99)
Potassium: 5 mmol/L (ref 3.5–5.2)
Sodium: 142 mmol/L (ref 134–144)
eGFR: 75 mL/min/{1.73_m2} (ref >59)

## 2021-04-03 LAB — HCV INTERPRETATION

## 2021-04-03 LAB — LIPID PANEL
Chol/HDL Ratio: 3.7 ratio (ref 0.0–5.0)
Cholesterol, Total: 172 mg/dL (ref 100–199)
HDL: 47 mg/dL (ref >39)
LDL Chol Calc (NIH): 109 mg/dL — ABNORMAL HIGH (ref 0–99)
Triglycerides: 85 mg/dL (ref 0–149)
VLDL Cholesterol Cal: 16 mg/dL (ref 5–40)

## 2021-04-03 LAB — HCV AB W REFLEX TO QUANT PCR: HCV Ab: <0.1 s/co ratio (ref 0.0–0.9)

## 2021-04-13 ENCOUNTER — Ambulatory Visit: Payer: BC Managed Care – PPO | Admitting: Family Medicine

## 2021-04-22 ENCOUNTER — Ambulatory Visit: Payer: BC Managed Care – PPO | Admitting: Family Medicine

## 2021-04-22 NOTE — Patient Instructions (Incomplete)
It was nice seeing you today! ° ° ° °Please arrive at least 15 minutes prior to your scheduled appointments. ° °Stay well, °Saxton Chain, MD °[facility] °[phone]  °

## 2021-04-22 NOTE — Progress Notes (Deleted)
    SUBJECTIVE:   CHIEF COMPLAINT / HPI:   HTN At last visit 3 weeks ago restarted on amlodipine and added HCTZ for severe HTN in the setting of running out of medications.  Restarted on nicotine patch and lozenge last visit.  PERTINENT  PMH / PSH: HTN, CVA, tobacco use (0.5 ppd)  OBJECTIVE:   There were no vitals taken for this visit.  General: ***, NAD CV: RRR, no murmurs*** Pulm: CTAB, no wheezes or rales  ASSESSMENT/PLAN:   No problem-specific Assessment & Plan notes found for this encounter.     Littie Deeds, MD [facility]   {    This will disappear when note is signed, click to select method of visit    :1}

## 2021-04-29 ENCOUNTER — Ambulatory Visit: Payer: BC Managed Care – PPO | Admitting: Family Medicine

## 2021-04-29 NOTE — Progress Notes (Deleted)
° ° °  SUBJECTIVE:   CHIEF COMPLAINT / HPI:    Saw Dr Wynelle Link on 11/17.  Had been out of all medications due to insurance lapse  Hyperlipidemia LDL goal <70 Refill statin, lipid panel today   Tobacco use Currently 0.5 packs/day smoker.  Restarted nicotine patch and as needed nicotine lozenge.   Hypertension Severe range HTN in office, asymptomatic.  Exam unremarkable, low suspicion for complications such as CVA, aortic dissection at this time.  Has been out of his medications for 1 year due to lapse in insurance.  Restarted amlodipine and added HCTZ 12.5 mg.  Strict ED precautions given.  Check BMP today.  Follow-up with myself scheduled 11/28.   CVA (cerebral vascular accident) (HCC) Refilled statin and aspirin.   HCM - GI referral for colonoscopy  PERTINENT  PMH / PSH: ***  OBJECTIVE:   There were no vitals taken for this visit.  ***  ASSESSMENT/PLAN:   No problem-specific Assessment & Plan notes found for this encounter.     Carney Living, MD [facility]

## 2021-05-07 ENCOUNTER — Encounter: Payer: Self-pay | Admitting: Family Medicine

## 2021-05-26 NOTE — Patient Instructions (Addendum)
It was nice seeing you today!  Go to the ED if you develop headache, blurry vision, chest pain, shortness of breath, speech difficulty, weakness, or other signs of stroke.  Increase HCTZ to 25 mg daily.  Take amlodipine 10 mg daily.  Shingles vaccine sent to your pharmacy.  Schedule a follow-up visit in the next 1-2 weeks.  Please arrive at least 15 minutes prior to your scheduled appointments.  Stay well, Littie Deeds, MD [facility] [phone]

## 2021-05-26 NOTE — Progress Notes (Signed)
° ° °  SUBJECTIVE:   CHIEF COMPLAINT / HPI:   At most recent visit 2 months ago, was restarted on amlodipine and added HCTZ for severe HTN in the setting of running out of medications.  Also started on nicotine patch and nicotine lozenge for smoking cessation.  Plan for follow-up after 2 weeks but patient has not been seen since then.  Patient states he never received the amlodipine so he has only been taking the HCTZ.  Has not been able to obtain a BP cuff but his daughter is working on ordering one.  Denies headache, vision changes, chest pain, shortness of breath, weakness.  He is still smoking about half pack per day.  PERTINENT  PMH / PSH: HTN, CVA, tobacco use (0.5 ppd)  OBJECTIVE:   BP (!) 262/140    Pulse 83    Ht 5\' 6"  (1.676 m)    Wt 171 lb 8 oz (77.8 kg)    SpO2 99%    BMI 27.68 kg/m   General: Alert, NAD Eyes: PERRL, EOMI CV: RRR, no murmurs Pulm: CTAB, no wheezes or rales Abdomen: Soft, nontender  ASSESSMENT/PLAN:   Hypertension Again with severe HTN, asymptomatic.  Unfortunately, I had sent his amlodipine to the wrong pharmacy.  I have corrected this and will increase his HCTZ to 25 mg.  Obtain a BMP today.  Follow-up in 1 to 2 weeks.  Strict ED precautions given.  Hyperlipidemia LDL goal <70 Obtain direct LDL today now that he is on statin.  Tobacco use Remains at 0.5 pack/day smoker.  Refilled nicotine patch and lozenge.  Counseling provided.   HCM - PCV20 given - shingles vaccine ordered - colonoscopy referral placed at last visit  Zola Button, MD [place]

## 2021-05-27 ENCOUNTER — Encounter: Payer: Self-pay | Admitting: Family Medicine

## 2021-05-27 ENCOUNTER — Other Ambulatory Visit: Payer: Self-pay

## 2021-05-27 ENCOUNTER — Ambulatory Visit (INDEPENDENT_AMBULATORY_CARE_PROVIDER_SITE_OTHER): Payer: BC Managed Care – PPO | Admitting: Family Medicine

## 2021-05-27 VITALS — BP 262/140 | HR 83 | Ht 66.0 in | Wt 171.5 lb

## 2021-05-27 DIAGNOSIS — Z720 Tobacco use: Secondary | ICD-10-CM

## 2021-05-27 DIAGNOSIS — Z716 Tobacco abuse counseling: Secondary | ICD-10-CM

## 2021-05-27 DIAGNOSIS — I10 Essential (primary) hypertension: Secondary | ICD-10-CM | POA: Diagnosis not present

## 2021-05-27 DIAGNOSIS — Z0000 Encounter for general adult medical examination without abnormal findings: Secondary | ICD-10-CM

## 2021-05-27 DIAGNOSIS — Z23 Encounter for immunization: Secondary | ICD-10-CM

## 2021-05-27 DIAGNOSIS — E785 Hyperlipidemia, unspecified: Secondary | ICD-10-CM | POA: Diagnosis not present

## 2021-05-27 MED ORDER — NICOTINE 7 MG/24HR TD PT24: 7.0000 mg | MEDICATED_PATCH | Freq: Every day | TRANSDERMAL | 2 refills | Status: DC

## 2021-05-27 MED ORDER — HYDROCHLOROTHIAZIDE 25 MG PO TABS: 25.0000 mg | ORAL_TABLET | Freq: Every day | ORAL | 3 refills | Status: DC

## 2021-05-27 MED ORDER — ZOSTER VAC RECOMB ADJUVANTED 50 MCG/0.5ML IM SUSR: 0.5000 mL | Freq: Once | INTRAMUSCULAR | 0 refills | Status: AC

## 2021-05-27 MED ORDER — NICOTINE POLACRILEX 2 MG MT LOZG: 2.0000 mg | LOZENGE | OROMUCOSAL | 0 refills | Status: DC | PRN

## 2021-05-27 MED ORDER — AMLODIPINE BESYLATE 10 MG PO TABS: 10.0000 mg | ORAL_TABLET | Freq: Every day | ORAL | 0 refills | Status: DC

## 2021-05-27 MED ORDER — ZOSTER VAC RECOMB ADJUVANTED 50 MCG/0.5ML IM SUSR: 0.5000 mL | Freq: Once | INTRAMUSCULAR | 0 refills | Status: DC

## 2021-05-27 MED ORDER — NICOTINE 7 MG/24HR TD PT24: 7.0000 mg | MEDICATED_PATCH | Freq: Every day | TRANSDERMAL | 2 refills | Status: AC

## 2021-05-27 NOTE — Assessment & Plan Note (Signed)
Again with severe HTN, asymptomatic.  Unfortunately, I had sent his amlodipine to the wrong pharmacy.  I have corrected this and will increase his HCTZ to 25 mg.  Obtain a BMP today.  Follow-up in 1 to 2 weeks.  Strict ED precautions given.

## 2021-05-27 NOTE — Assessment & Plan Note (Signed)
Remains at 0.5 pack/day smoker.  Refilled nicotine patch and lozenge.  Counseling provided.

## 2021-05-27 NOTE — Assessment & Plan Note (Signed)
Obtain direct LDL today now that he is on statin.

## 2021-05-30 LAB — BASIC METABOLIC PANEL
BUN/Creatinine Ratio: 13 (ref 9–20)
BUN: 17 mg/dL (ref 6–24)
CO2: 21 mmol/L (ref 20–29)
Calcium: 9.6 mg/dL (ref 8.7–10.2)
Chloride: 101 mmol/L (ref 96–106)
Creatinine, Ser: 1.29 mg/dL — ABNORMAL HIGH (ref 0.76–1.27)
Glucose: 85 mg/dL (ref 70–99)
Potassium: 4.3 mmol/L (ref 3.5–5.2)
Sodium: 138 mmol/L (ref 134–144)
eGFR: 66 mL/min/{1.73_m2} (ref >59)

## 2021-05-30 LAB — LDL CHOLESTEROL, DIRECT: LDL Direct: 131 mg/dL — ABNORMAL HIGH (ref 0–99)

## 2021-06-05 ENCOUNTER — Other Ambulatory Visit: Payer: Self-pay | Admitting: Family Medicine

## 2021-06-10 ENCOUNTER — Encounter: Payer: Self-pay | Admitting: Family Medicine

## 2021-06-10 ENCOUNTER — Telehealth: Payer: Self-pay | Admitting: Family Medicine

## 2021-06-10 NOTE — Telephone Encounter (Signed)
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 Patient asks that this letter be completed and left upfront today. I told him we could not guarantee it would be today but I would let Dr. Nancy Fetter know that was his request.  Please call patient when letter is ready and has been placed up front.

## 2021-06-10 NOTE — Telephone Encounter (Signed)
Letter has been drafted and routed, please print and call patient when it is ready.

## 2021-06-11 NOTE — Patient Instructions (Incomplete)
It was nice seeing you today!  Blood work today.  See me in 3 months or whenever is a good for you.  Stay well, Dana Dorner, MD [facility] [phone]  --  Make sure to check out at the front desk before you leave today.  Please arrive at least 15 minutes prior to your scheduled appointments.  If you had blood work today, I will send you a MyChart message or a letter if results are normal. Otherwise, I will give you a call.  If you had a referral placed, they will call you to set up an appointment. Please give us a call if you don't hear back in the next 2 weeks.  If you need additional refills before your next appointment, please call your pharmacy first.  

## 2021-06-11 NOTE — Progress Notes (Deleted)
° ° °  SUBJECTIVE:   CHIEF COMPLAINT / HPI:  No chief complaint on file.   At last visit HCTZ increased to 25 mg, resent in amlodipine 10 mg.  PERTINENT  PMH / PSH: HTN, CVA, HLD, tobacco use  Patient Care Team: Littie Deeds, MD as PCP - General (Family Medicine)   OBJECTIVE:   There were no vitals taken for this visit.  Physical Exam   Depression screen [facility] 2/9 05/27/2021  Decreased Interest 2  Down, Depressed, Hopeless 0  PHQ - 2 Score 2  Altered sleeping 0  Tired, decreased energy 0  Change in appetite 0  Feeling bad or failure about yourself  0  Trouble concentrating 0  Moving slowly or fidgety/restless 0  Suicidal thoughts 0  PHQ-9 Score 2     {Show previous vital signs (optional):23777}  {Labs   Heme   Chem   Endocrine   Serology   Results Review (optional):23779}  ASSESSMENT/PLAN:   No problem-specific Assessment & Plan notes found for this encounter.    No follow-ups on file.   Littie Deeds, MD [facility]

## 2021-06-12 ENCOUNTER — Ambulatory Visit: Payer: BC Managed Care – PPO | Admitting: Family Medicine

## 2021-06-19 ENCOUNTER — Telehealth: Payer: Self-pay | Admitting: Family Medicine

## 2021-06-19 MED ORDER — ATORVASTATIN CALCIUM 80 MG PO TABS: 80.0000 mg | ORAL_TABLET | Freq: Every day | ORAL | 3 refills | Status: DC

## 2021-06-19 MED ORDER — NICOTINE POLACRILEX 2 MG MT LOZG: 2.0000 mg | LOZENGE | OROMUCOSAL | 3 refills | Status: DC | PRN

## 2021-06-19 NOTE — Telephone Encounter (Signed)
Called patient to follow-up missed appointment and recent labs.  LDL above goal - increase to atorvastatin 80 mg.  Nicotine lozenges refilled as requested.  Patient wanted to reschedule to Wednesday 2/8. Unfortunately I am unavailable so I have rescheduled him with access to care pool for f/u severe HTN. He is on amlodipine 10 mg and HCTZ 25 mg.   Consider increasing HCTZ if not well controlled which I suspect will likely be the case. Also should have repeat BMP.

## 2021-06-20 ENCOUNTER — Other Ambulatory Visit: Payer: Self-pay | Admitting: Family Medicine

## 2021-06-21 NOTE — Progress Notes (Deleted)
° ° °  SUBJECTIVE:   CHIEF COMPLAINT / HPI:   Blood Pressure F/U Eric Patterson is a 56 y.o. male who presents to the clinic today for follow up on her blood pressure. Patient last saw his PCP on 1/11, BP at the time 262/140.  His medications include HCTZ 25 mg, amlodipine 10 mg.  He reports *** adherence to medications and tolerating without adverse effects. Patient was scheduled for follow-up visit on 2/3 but unfortunately did not show for appointment.  Patient also seen in November for severe-range BP and advised to follow up in 1-week. He did not return for 16-months. Patient notes *** barriers.  Denies chest pain, shortness of breath, lower extremity edema, headaches, and vision changes.    Tobacco Use Disorder 0.5 ppd smoker for the last *** years. At last appointment had nicotine patch and lozenges refilled. Reports ***.   PERTINENT  PMH / PSH:  Past Medical History:  Diagnosis Date   Foot injury 12/07/2018   Hyperlipidemia    Hypertension    Stroke ([facility]CC) 2017   R side weakness deficits   OBJECTIVE:   There were no vitals taken for this visit. ***  General: NAD, pleasant, able to participate in exam Cardiac: RRR, no murmurs. Respiratory: CTAB, normal effort, No wheezes, rales or rhonchi Abdomen: Bowel sounds present, nontender, nondistended, no hepatosplenomegaly. Extremities: no edema or cyanosis. Skin: warm and dry, no rashes noted Neuro: alert, no obvious focal deficits Psych: Normal affect and mood  ASSESSMENT/PLAN:   No problem-specific Assessment & Plan notes found for this encounter.      Sabino Dick, DO [facility]

## 2021-06-21 NOTE — Patient Instructions (Incomplete)
It was wonderful to see you today. ? ?Please bring ALL of your medications with you to every visit.  ? ?Today we talked about: ? ?** ? ? ?Thank you for choosing [facility].  ? ?Please call [phone] with any questions about today's appointment. ? ?Please be sure to schedule follow up at the front  desk before you leave today.  ? ?Sai Zinn, DO ?PGY-2 Family Medicine   ?

## 2021-06-24 ENCOUNTER — Ambulatory Visit: Payer: BC Managed Care – PPO

## 2021-06-25 ENCOUNTER — Telehealth: Payer: Self-pay | Admitting: *Deleted

## 2021-06-25 NOTE — Telephone Encounter (Addendum)
Called patient to discuss the altered letter from 06/10/21.    Advised that his work contacted [place] and provided a copy of the letter that has been altered ( no HIPAA info given to employer).  Pt reports that he does not know how the date was changed and that he did not change it and that his "work just put it in his file"  Given that we can not prove that letter was altered by patient, patient will continue at our office. Patient was informed that this is a legal document and altering it is illegal.  If this was to happen again we would have to dismiss him from our care.  Patient reports understanding and appreciates being allowed to stay at practice. Christen Bame, CMA   Below is a copy of the altered letter we received:

## 2021-06-29 ENCOUNTER — Ambulatory Visit: Payer: BC Managed Care – PPO

## 2021-07-06 ENCOUNTER — Other Ambulatory Visit: Payer: Self-pay | Admitting: Family Medicine

## 2021-07-10 ENCOUNTER — Encounter: Payer: Self-pay | Admitting: Family Medicine

## 2021-07-10 NOTE — Progress Notes (Signed)
Patient has no-showed to multiple appointments in a 6-month period. Per our no-show policy, a letter has been routed to [facility] Admin to be mailed to patient regarding likely dismissal for repeat no show. Will CC to PCP.  ° °

## 2021-09-03 ENCOUNTER — Other Ambulatory Visit: Payer: Self-pay | Admitting: Family Medicine

## 2021-09-15 ENCOUNTER — Other Ambulatory Visit: Payer: Self-pay | Admitting: Family Medicine

## 2021-12-05 ENCOUNTER — Observation Stay (HOSPITAL_COMMUNITY)
Admission: EM | Admit: 2021-12-05 | Discharge: 2021-12-06 | Disposition: A | Discharge status: Home / Self Care | Payer: Self-pay | Attending: Family Medicine | Admitting: Family Medicine

## 2021-12-05 ENCOUNTER — Emergency Department (HOSPITAL_COMMUNITY): Payer: Self-pay

## 2021-12-05 ENCOUNTER — Other Ambulatory Visit: Payer: Self-pay

## 2021-12-05 ENCOUNTER — Observation Stay (HOSPITAL_COMMUNITY): Payer: Self-pay

## 2021-12-05 ENCOUNTER — Encounter (HOSPITAL_COMMUNITY): Payer: Self-pay

## 2021-12-05 DIAGNOSIS — E785 Hyperlipidemia, unspecified: Secondary | ICD-10-CM | POA: Diagnosis present

## 2021-12-05 DIAGNOSIS — Z7982 Long term (current) use of aspirin: Secondary | ICD-10-CM | POA: Insufficient documentation

## 2021-12-05 DIAGNOSIS — I10 Essential (primary) hypertension: Secondary | ICD-10-CM | POA: Diagnosis present

## 2021-12-05 DIAGNOSIS — Z95818 Presence of other cardiac implants and grafts: Secondary | ICD-10-CM | POA: Insufficient documentation

## 2021-12-05 DIAGNOSIS — J189 Pneumonia, unspecified organism: Secondary | ICD-10-CM

## 2021-12-05 DIAGNOSIS — I160 Hypertensive urgency: Secondary | ICD-10-CM

## 2021-12-05 DIAGNOSIS — R10816 Epigastric abdominal tenderness: Secondary | ICD-10-CM | POA: Insufficient documentation

## 2021-12-05 DIAGNOSIS — R0602 Shortness of breath: Secondary | ICD-10-CM | POA: Insufficient documentation

## 2021-12-05 DIAGNOSIS — J180 Bronchopneumonia, unspecified organism: Principal | ICD-10-CM | POA: Insufficient documentation

## 2021-12-05 DIAGNOSIS — Z720 Tobacco use: Secondary | ICD-10-CM | POA: Diagnosis present

## 2021-12-05 DIAGNOSIS — F17210 Nicotine dependence, cigarettes, uncomplicated: Secondary | ICD-10-CM | POA: Insufficient documentation

## 2021-12-05 DIAGNOSIS — Z8673 Personal history of transient ischemic attack (TIA), and cerebral infarction without residual deficits: Secondary | ICD-10-CM

## 2021-12-05 DIAGNOSIS — R0789 Other chest pain: Secondary | ICD-10-CM | POA: Diagnosis present

## 2021-12-05 DIAGNOSIS — Z79899 Other long term (current) drug therapy: Secondary | ICD-10-CM | POA: Insufficient documentation

## 2021-12-05 DIAGNOSIS — R778 Other specified abnormalities of plasma proteins: Secondary | ICD-10-CM | POA: Insufficient documentation

## 2021-12-05 LAB — BASIC METABOLIC PANEL
Anion gap: 11 (ref 5–15)
BUN: 18 mg/dL (ref 6–20)
CO2: 21 mmol/L — ABNORMAL LOW (ref 22–32)
Calcium: 9.1 mg/dL (ref 8.9–10.3)
Chloride: 109 mmol/L (ref 98–111)
Creatinine, Ser: 1.13 mg/dL (ref 0.61–1.24)
GFR, Estimated: >60 mL/min (ref >60)
Glucose, Bld: 89 mg/dL (ref 70–99)
Potassium: 3.8 mmol/L (ref 3.5–5.1)
Sodium: 141 mmol/L (ref 135–145)

## 2021-12-05 LAB — URINALYSIS, ROUTINE W REFLEX MICROSCOPIC
Bacteria, UA: NONE SEEN
Bilirubin Urine: NEGATIVE
Glucose, UA: NEGATIVE mg/dL
Hgb urine dipstick: NEGATIVE
Ketones, ur: NEGATIVE mg/dL
Leukocytes,Ua: NEGATIVE
Nitrite: NEGATIVE
Protein, ur: 30 mg/dL — AB
Specific Gravity, Urine: 1.028 (ref 1.005–1.030)
pH: 5 (ref 5.0–8.0)

## 2021-12-05 LAB — HEPATIC FUNCTION PANEL
ALT: 23 U/L (ref 0–44)
AST: 22 U/L (ref 15–41)
Albumin: 4 g/dL (ref 3.5–5.0)
Alkaline Phosphatase: 88 U/L (ref 38–126)
Bilirubin, Direct: 0.1 mg/dL (ref 0.0–0.2)
Indirect Bilirubin: 0.7 mg/dL (ref 0.3–0.9)
Total Bilirubin: 0.8 mg/dL (ref 0.3–1.2)
Total Protein: 8 g/dL (ref 6.5–8.1)

## 2021-12-05 LAB — CBC
HCT: 46.9 % (ref 39.0–52.0)
Hemoglobin: 15.9 g/dL (ref 13.0–17.0)
MCH: 31.1 pg (ref 26.0–34.0)
MCHC: 33.9 g/dL (ref 30.0–36.0)
MCV: 91.8 fL (ref 80.0–100.0)
Platelets: 325 10*3/uL (ref 150–400)
RBC: 5.11 MIL/uL (ref 4.22–5.81)
RDW: 13.2 % (ref 11.5–15.5)
WBC: 5.2 10*3/uL (ref 4.0–10.5)
nRBC: 0 % (ref 0.0–0.2)

## 2021-12-05 LAB — TROPONIN I (HIGH SENSITIVITY)
Troponin I (High Sensitivity): 23 ng/L — ABNORMAL HIGH (ref <18)
Troponin I (High Sensitivity): 24 ng/L — ABNORMAL HIGH (ref <18)

## 2021-12-05 LAB — LIPASE, BLOOD: Lipase: 34 U/L (ref 11–51)

## 2021-12-05 MED ORDER — HYDRALAZINE HCL 25 MG PO TABS: 50.0000 mg | ORAL_TABLET | Freq: Three times a day (TID) | ORAL | Status: DC

## 2021-12-05 MED ORDER — KETOROLAC TROMETHAMINE 30 MG/ML IJ SOLN: 30.0000 mg | Freq: Four times a day (QID) | INTRAMUSCULAR | Status: DC | PRN

## 2021-12-05 MED ORDER — SODIUM CHLORIDE 0.9 % IV SOLN
500.0000 mg | INTRAVENOUS | Status: DC
  Administered 2021-12-05: 500 mg via INTRAVENOUS
  Filled 2021-12-05: qty 5

## 2021-12-05 MED ORDER — SODIUM CHLORIDE 0.9 % IV SOLN: 1.0000 g | INTRAVENOUS | Status: DC

## 2021-12-05 MED ORDER — SENNOSIDES-DOCUSATE SODIUM 8.6-50 MG PO TABS: 1.0000 | ORAL_TABLET | Freq: Every evening | ORAL | Status: DC | PRN

## 2021-12-05 MED ORDER — ATORVASTATIN CALCIUM 40 MG PO TABS
80.0000 mg | ORAL_TABLET | Freq: Every day | ORAL | Status: DC
  Administered 2021-12-06: 80 mg via ORAL
  Filled 2021-12-05: qty 2

## 2021-12-05 MED ORDER — ACETAMINOPHEN 500 MG PO TABS
1000.0000 mg | ORAL_TABLET | Freq: Four times a day (QID) | ORAL | Status: DC | PRN
  Administered 2021-12-06: 1000 mg via ORAL
  Filled 2021-12-05: qty 2

## 2021-12-05 MED ORDER — NICOTINE 21 MG/24HR TD PT24
21.0000 mg | MEDICATED_PATCH | Freq: Every day | TRANSDERMAL | Status: DC
Start: 2021-12-05 — End: 2021-12-06
  Administered 2021-12-05 – 2021-12-06 (×2): 21 mg via TRANSDERMAL
  Filled 2021-12-05 (×2): qty 1

## 2021-12-05 MED ORDER — SODIUM CHLORIDE 0.9 % IV SOLN
1.0000 g | Freq: Once | INTRAVENOUS | Status: AC
  Administered 2021-12-05: 1 g via INTRAVENOUS
  Filled 2021-12-05: qty 10

## 2021-12-05 MED ORDER — HYDRALAZINE HCL 20 MG/ML IJ SOLN
10.0000 mg | INTRAMUSCULAR | Status: DC | PRN
Start: 2021-12-05 — End: 2021-12-06

## 2021-12-05 MED ORDER — ONDANSETRON HCL 4 MG/2ML IJ SOLN: 4.0000 mg | Freq: Four times a day (QID) | INTRAMUSCULAR | Status: DC | PRN

## 2021-12-05 MED ORDER — ASPIRIN 81 MG PO CHEW
81.0000 mg | CHEWABLE_TABLET | Freq: Every day | ORAL | Status: DC
  Administered 2021-12-06: 81 mg via ORAL
  Filled 2021-12-05: qty 1

## 2021-12-05 MED ORDER — FENTANYL CITRATE PF 50 MCG/ML IJ SOSY
50.0000 ug | PREFILLED_SYRINGE | Freq: Once | INTRAMUSCULAR | Status: AC
  Administered 2021-12-05: 50 ug via INTRAVENOUS
  Filled 2021-12-05: qty 1

## 2021-12-05 MED ORDER — HYDRALAZINE HCL 20 MG/ML IJ SOLN
10.0000 mg | INTRAMUSCULAR | Status: AC
Start: 2021-12-05 — End: 2021-12-05
  Administered 2021-12-05: 10 mg via INTRAVENOUS
  Filled 2021-12-05: qty 1

## 2021-12-05 MED ORDER — ONDANSETRON HCL 4 MG PO TABS: 4.0000 mg | ORAL_TABLET | Freq: Four times a day (QID) | ORAL | Status: DC | PRN

## 2021-12-05 MED ORDER — IOHEXOL 300 MG/ML  SOLN
100.0000 mL | Freq: Once | INTRAMUSCULAR | Status: AC | PRN
  Administered 2021-12-05: 100 mL via INTRAVENOUS

## 2021-12-05 MED ORDER — AZITHROMYCIN 250 MG PO TABS: 500.0000 mg | ORAL_TABLET | Freq: Every day | ORAL | Status: DC

## 2021-12-05 MED ORDER — AMLODIPINE BESYLATE 10 MG PO TABS
10.0000 mg | ORAL_TABLET | Freq: Every day | ORAL | Status: DC
  Administered 2021-12-06: 10 mg via ORAL
  Filled 2021-12-05: qty 1

## 2021-12-05 MED ORDER — LABETALOL HCL 5 MG/ML IV SOLN
20.0000 mg | Freq: Once | INTRAVENOUS | Status: AC
Start: 2021-12-05 — End: 2021-12-05
  Administered 2021-12-05: 20 mg via INTRAVENOUS
  Filled 2021-12-05: qty 4

## 2021-12-05 MED ORDER — CLONIDINE HCL 0.1 MG PO TABS
0.1000 mg | ORAL_TABLET | Freq: Two times a day (BID) | ORAL | Status: DC
  Administered 2021-12-05 – 2021-12-06 (×2): 0.1 mg via ORAL
  Filled 2021-12-05 (×2): qty 1

## 2021-12-05 MED ORDER — ACETAMINOPHEN 650 MG RE SUPP: 650.0000 mg | Freq: Four times a day (QID) | RECTAL | Status: DC | PRN

## 2021-12-05 MED ORDER — AMLODIPINE BESYLATE 5 MG PO TABS
10.0000 mg | ORAL_TABLET | Freq: Once | ORAL | Status: AC
  Administered 2021-12-05: 10 mg via ORAL
  Filled 2021-12-05: qty 2

## 2021-12-05 MED ORDER — CLONIDINE HCL 0.1 MG PO TABS
0.2000 mg | ORAL_TABLET | Freq: Once | ORAL | Status: AC
  Administered 2021-12-05: 0.2 mg via ORAL
  Filled 2021-12-05: qty 2

## 2021-12-05 MED ORDER — HYDROCHLOROTHIAZIDE 12.5 MG PO TABS: 25.0000 mg | ORAL_TABLET | Freq: Every day | ORAL | Status: DC

## 2021-12-05 MED ORDER — SODIUM CHLORIDE 0.9% FLUSH
3.0000 mL | Freq: Two times a day (BID) | INTRAVENOUS | Status: DC
Start: 2021-12-05 — End: 2021-12-06

## 2021-12-05 MED ORDER — HYDROCHLOROTHIAZIDE 25 MG PO TABS
25.0000 mg | ORAL_TABLET | Freq: Every day | ORAL | Status: DC
  Administered 2021-12-05 – 2021-12-06 (×2): 25 mg via ORAL
  Filled 2021-12-05 (×2): qty 1

## 2021-12-05 MED ORDER — ENOXAPARIN SODIUM 40 MG/0.4ML IJ SOSY
40.0000 mg | PREFILLED_SYRINGE | INTRAMUSCULAR | Status: DC
Start: 2021-12-05 — End: 2021-12-06
  Administered 2021-12-05: 40 mg via SUBCUTANEOUS
  Filled 2021-12-05: qty 0.4

## 2021-12-05 NOTE — Assessment & Plan Note (Signed)
CT chest with changes concerning for early bronchopneumonia.  Might be contributing some to his dyspnea/pleuritic chest discomfort although does have significant reproducible right-sided chest wall musculoskeletal pain.  Saturating well on room air. -Continue ceftriaxone and azithromycin for now -Supplemental oxygen as needed

## 2021-12-05 NOTE — Assessment & Plan Note (Signed)
Reproducible right lateral chest wall pain on admission. -Obtain x-ray of right ribs

## 2021-12-05 NOTE — Assessment & Plan Note (Addendum)
Chronically uncontrolled hypertension in the setting of medication nonadherence and missed appointments with PCP.  Has reported history of angioedema with benazepril. -Restart amlodipine 10 mg daily -Restart HCTZ 25 mg daily -Add clonidine 0.1 mg BID -IV hydralazine as needed -Needs to keep follow-up appointments with PCP

## 2021-12-05 NOTE — ED Notes (Signed)
Patient transported to CT 

## 2021-12-05 NOTE — ED Provider Notes (Signed)
[facility]-EMERGENCY DEPT Provider Note   CSN: [id number] Arrival date & time: 12/05/21  1428     History  Chief Complaint  Patient presents with   Shortness of Breath   Chest Pain   Hypertension    Eric Patterson is a 56 y.o. male.  HPI 56 year old man history of stroke, hypertension, not taking his medications presents today complaining of mild lateral chest pain.  He describes it as in the rib cage on both his right and his left side.  Is worse with taking a deep breath.  He feels short of breath secondary to this.  He has been not taking his medications for the past month.  He denies any fever, chills, productive cough, anterior chest pain, upper abdominal pain, nausea, vomiting, or diarrhea     Home Medications Prior to Admission medications   Medication Sig Start Date End Date Taking? Authorizing Provider  acetaminophen (TYLENOL) 325 MG tablet Take 650 mg by mouth every 6 (six) hours as needed for mild pain or headache.    Yes [provider]  amLODipine (NORVASC) 10 MG tablet TAKE 1 TABLET(10 MG) BY MOUTH DAILY Patient taking differently: Take 10 mg by mouth daily. 09/03/21  Yes Littie Deeds, MD  aspirin 81 MG chewable tablet Chew 1 tablet (81 mg total) by mouth daily. 04/02/21  Yes Littie Deeds, MD  atorvastatin (LIPITOR) 80 MG tablet Take 1 tablet (80 mg total) by mouth daily. 06/19/21  Yes Littie Deeds, MD  gabapentin (NEURONTIN) 300 MG capsule Take 300 mg by mouth 2 (two) times daily as needed (for pain).  12/19/18  Yes [provider]  hydrochlorothiazide (HYDRODIURIL) 25 MG tablet Take 1 tablet (25 mg total) by mouth daily. 05/27/21  Yes Littie Deeds, MD  nicotine (NICODERM CQ - DOSED IN MG/24 HR) 7 mg/24hr patch Place 1 patch (7 mg total) onto the skin daily. 05/27/21  Yes Littie Deeds, MD  nicotine polacrilex (COMMIT) 2 MG lozenge TAKE 1 LOZENGE BY MOUTH AS NEEDED FOR SMOKING CESSATION 06/22/21  Yes Littie Deeds, MD  amLODipine-benazepril  (LOTREL) 10-20 MG capsule Take 1 capsule by mouth daily. 02/06/19 07/11/19  Sandre Kitty, MD      Allergies    Benazepril, Aleve [naproxen sodium], and Shellfish-derived products    Review of Systems   Review of Systems  Physical Exam Updated Vital Signs BP (!) 178/113   Pulse (!) 56   Temp 97.8 F (36.6 C) (Oral)   Resp 20   Ht 1.676 m (5\' 6" )   Wt 79.4 kg   SpO2 99%   BMI 28.25 kg/m  Physical Exam Vitals and nursing note reviewed.  Constitutional:      Appearance: He is well-developed.  HENT:     Head: Normocephalic and atraumatic.     Right Ear: External ear normal.     Left Ear: External ear normal.     Nose: Nose normal.  Eyes:     Extraocular Movements: Extraocular movements intact.  Neck:     Trachea: No tracheal deviation.  Cardiovascular:     Rate and Rhythm: Normal rate and regular rhythm.     Comments: Chest with mild tenderness bilateral lower rib cage  Pulmonary:     Effort: Pulmonary effort is normal.  Abdominal:     General: Bowel sounds are normal.     Palpations: Abdomen is soft.     Tenderness: There is abdominal tenderness.     Comments: Some epigastric and right upper quadrant  tenderness to palpation  Musculoskeletal:        General: Normal range of motion.  Skin:    General: Skin is warm and dry.  Neurological:     Mental Status: He is alert and oriented to person, place, and time.  Psychiatric:        Mood and Affect: Mood normal.        Behavior: Behavior normal.     ED Results / Procedures / Treatments   Labs (all labs ordered are listed, but only abnormal results are displayed) Labs Reviewed  BASIC METABOLIC PANEL - Abnormal; Notable for the following components:      Result Value   CO2 21 (*)    All other components within normal limits  URINALYSIS, ROUTINE W REFLEX MICROSCOPIC - Abnormal; Notable for the following components:   Protein, ur 30 (*)    All other components within normal limits  TROPONIN I (HIGH SENSITIVITY) -  Abnormal; Notable for the following components:   Troponin I (High Sensitivity) 23 (*)    All other components within normal limits  TROPONIN I (HIGH SENSITIVITY) - Abnormal; Notable for the following components:   Troponin I (High Sensitivity) 24 (*)    All other components within normal limits  CBC  HEPATIC FUNCTION PANEL  LIPASE, BLOOD    EKG EKG Interpretation  Date/Time:  Saturday December 05 2021 14:33:34 EDT Ventricular Rate:  83 PR Interval:  155 QRS Duration: 97 QT Interval:  381 QTC Calculation: 448 R Axis:   66 Text Interpretation: Sinus rhythm Biatrial enlargement LVH with secondary repolarization abnormality Baseline wander in lead(s) V4 No significant change since last tracing Confirmed by Margarita Grizzle [phone]) on 12/05/2021 5:27:46 PM  Radiology CT CHEST ABDOMEN PELVIS W CONTRAST  Result Date: 12/05/2021 CLINICAL DATA:  Shortness of breath, chest pain.  Hypertension. EXAM: CT CHEST, ABDOMEN, AND PELVIS WITH CONTRAST TECHNIQUE: Multidetector CT imaging of the chest, abdomen and pelvis was performed following the standard protocol during bolus administration of intravenous contrast. RADIATION DOSE REDUCTION: This exam was performed according to the departmental dose-optimization program which includes automated exposure control, adjustment of the mA and/or kV according to patient size and/or use of iterative reconstruction technique. CONTRAST:  OMNIPAQUE IOHEXOL 300 MG/ML  SOLN COMPARISON:  None Available. FINDINGS: CT CHEST FINDINGS Cardiovascular: No significant vascular findings. Normal heart size. No pericardial effusion. Mild coronary artery atherosclerotic calcifications. Mild atherosclerotic calcification of the aortic arch. Mediastinum/Nodes: No enlarged mediastinal, hilar, or axillary lymph nodes. Thyroid gland, trachea, and esophagus demonstrate no significant findings. Lungs/Pleura: Mild centrilobular emphysematous changes. Mild bilateral lower lobe bronchial  thickening with adjacent ground-glass attenuation concerning for infectious/inflammatory process. No pleural effusion or focal consolidation. Musculoskeletal: No chest wall mass or suspicious bone lesions identified. CT ABDOMEN PELVIS FINDINGS Hepatobiliary: No focal liver abnormality is seen. No gallstones, gallbladder wall thickening, or biliary dilatation. Pancreas: Unremarkable. No pancreatic ductal dilatation or surrounding inflammatory changes. Spleen: Normal in size without focal abnormality. Adrenals/Urinary Tract: Adrenal glands are unremarkable. Punctate nonobstructing calculus in the upper pole of the right kidney. No evidence of nephrolithiasis or hydronephrosis. Bladder is unremarkable. Stomach/Bowel: Stomach is within normal limits. Appendix appears normal. No evidence of bowel wall thickening, distention, or inflammatory changes. Vascular/Lymphatic: No significant vascular findings are present. Mild atherosclerotic calcification of infrarenal abdominal aorta and branch vessels. Aorta is however normal in caliber. No evidence of aortic aneurysm. No enlarged abdominal or pelvic lymph nodes. Reproductive: Prostate is unremarkable. Other: No abdominal wall hernia or abnormality.  No abdominopelvic ascites. Musculoskeletal: No acute or significant osseous findings. IMPRESSION: 1. Mild bilateral lower lobe bronchial thickening with adjacent ground-glass attenuation concerning for bronchopneumonia, clinical correlation is suggested. 2. Aorta and branch vessels are normal in caliber. Mild atherosclerotic disease. No evidence of aortic aneurysm or acute vascular abnormality. 3.  No CT evidence of acute abdominal/pelvic process. Electronically Signed   By: Larose Hires D.O.   On: 12/05/2021 18:16   DG Chest Portable 1 View  Result Date: 12/05/2021 CLINICAL DATA:  Shortness of breath this morning with pleuritic pain over the right anterior ribs. EXAM: PORTABLE CHEST 1 VIEW COMPARISON:  None Available.  FINDINGS: Lungs are adequately inflated without focal airspace consolidation or effusion. Mild cardiomegaly. Loop recorder projects over the left heart. Remainder the exam is unremarkable. IMPRESSION: 1. No acute cardiopulmonary disease. 2. Mild cardiomegaly. Electronically Signed   By: Elberta Fortis M.D.   On: 12/05/2021 15:04    Procedures .Critical Care  Performed by: Margarita Grizzle, MD Authorized by: Margarita Grizzle, MD   Critical care provider statement:    Critical care time (minutes):  30   Critical care was necessary to treat or prevent imminent or life-threatening deterioration of the following conditions:  Circulatory failure and respiratory failure   Critical care was time spent personally by me on the following activities:  Discussions with consultants, evaluation of patient's response to treatment, examination of patient, ordering and performing treatments and interventions, ordering and review of laboratory studies, ordering and review of radiographic studies, pulse oximetry and re-evaluation of patient's condition     Medications Ordered in ED Medications  cefTRIAXone (ROCEPHIN) 1 g in sodium chloride 0.9 % 100 mL IVPB (1 g Intravenous New Bag/Given 12/05/21 1837)  azithromycin (ZITHROMAX) 500 mg in sodium chloride 0.9 % 250 mL IVPB (has no administration in time range)  fentaNYL (SUBLIMAZE) injection 50 mcg (50 mcg Intravenous Given 12/05/21 1559)  amLODipine (NORVASC) tablet 10 mg (10 mg Oral Given 12/05/21 1550)  cloNIDine (CATAPRES) tablet 0.2 mg (0.2 mg Oral Given 12/05/21 1550)  labetalol (NORMODYNE) injection 20 mg (20 mg Intravenous Given 12/05/21 1554)  hydrALAZINE (APRESOLINE) injection 10 mg (10 mg Intravenous Given 12/05/21 1552)  iohexol (OMNIPAQUE) 300 MG/ML solution 100 mL (100 mLs Intravenous Contrast Given 12/05/21 1743)    ED Course/ Medical Decision Making/ A&P Clinical Course as of 12/05/21 1843  Sat Dec 05, 2021  1511 X-Legion Discher reviewed interpreted without acute   abnormality noted on my review Radiologist interpretation notes mild cardiomegaly [DR]  1612 CBC reviewed interpreted and normal [DR]  1838 CT personally reviewed and interpreted no evidence of aortic dissection but lower lobe filtrates noted radiologist interpretation concurs [DR]  1839 Basic metabolic panel(!) Basic metabolic panel reviewed and interpreted and mild decrease in CO2 otherwise negative [DR]    Clinical Course User Index [DR] Margarita Grizzle, MD                           Medical Decision Making 56 year old male with lower chest/upper abdominal pain that is reproducible with lateral palpation and abdominal palpation Differential diagnosis includes but is not limited to diseases of the great vessels including the aorta, pancreatitis, gastritis, acute biliary disease, diseases of the lower lungs including pneumothorax, infectious etiologies such as pneumonia, coronary artery disease is less likely given patient's above pain. Additionally patient has chronic hypertension has not been taking his medications and significantly hypertensive here. Patient given multiple medications to lower blood pressure EKG  was obtained that does not show evidence of any ischemic ischemia Chest x-Kamari Bilek obtained shows no evidence of acute infection pneumothorax or widening of the mediastinum Initial troponin is elevated at 23.  Would consider ischemia, but will repeat and likely due to demand given hypertension Awaiting CT See report negative for PE but significant for bilateral lower lobe groundglass appearance consistent with pneumonia Rocephin and Zithromax ordered 1 dyspnea likely secondary to community-acquired pneumonia and possibly secondary to hypertensive emergency 2 hypertensive emergency/urgency patient with significantly elevated blood pressure on arrival here systolic of 250.  He has not been taking his medications for the past month.  Patient given IV and p.o. medication here in the ED and  blood pressure has decreased to 178/113  3 elevated troponin Troponin repeat troponin slightly elevated at 23 and 24.  I suspect this is due to some demand ischemia due to his hypertension.  Patient does not have any anterior chest pain and EKG does not show evidence of acute ischemia Care discussed with Dr. Allena Katz who was excepted for admission  Amount and/or Complexity of Data Reviewed Labs: ordered. Decision-making details documented in ED Course. Radiology: ordered and independent interpretation performed. Decision-making details documented in ED Course. ECG/medicine tests: ordered and independent interpretation performed. Decision-making details documented in ED Course. Discussion of management or test interpretation with external provider(s): Patient continued on monitor here in review ED with normal sinus rhythm Blood pressure has decreased with medications and is currently 178/113 Oxygen saturations are maintained  Risk Prescription drug management.           Final Clinical Impression(s) / ED Diagnoses Final diagnoses:  Community acquired pneumonia, unspecified laterality  Hypertensive urgency  Elevated troponin    Rx / DC Orders ED Discharge Orders     None         Margarita Grizzle, MD 12/05/21 1843

## 2021-12-05 NOTE — ED Triage Notes (Signed)
Pt c/o sob that started this morning, states he has severe pain in the right side of his ribs when he takes a deep breath. States he has also been out of all his meds for months now. Pt is AxOx4.

## 2021-12-05 NOTE — ED Notes (Signed)
Dr. Patel at bedside 

## 2021-12-05 NOTE — Assessment & Plan Note (Signed)
Restart aspirin 81 mg daily and atorvastatin 80 mg daily.

## 2021-12-05 NOTE — Assessment & Plan Note (Signed)
Smoking half a pack per day.  Nicotine patch ordered.

## 2021-12-05 NOTE — Hospital Course (Signed)
Eric Patterson is a 56 y.o. male with medical history significant for history of CVA, uncontrolled hypertension, hyperlipidemia, tobacco use who is admitted with bronchopneumonia and uncontrolled hypertension.

## 2021-12-05 NOTE — H&P (Addendum)
History and Physical    Eric Patterson JOA:[id number] DOB: 1966-02-09 DOA: 12/05/2021  PCP: Littie Deeds, MD  Patient coming from: Home  I have personally briefly reviewed patient's old medical records in [facility]  Chief Complaint: Shortness of breath  HPI: Eric Patterson is a 56 y.o. Patterson with medical history significant for history of CVA, uncontrolled hypertension, hyperlipidemia, tobacco use who presented to the ED for evaluation of shortness of breath.  Patient states he woke up this morning with right-sided chest wall pain, shortness of breath, worsening pain with deep inspiration.  He denies cough, nausea, vomiting, subjective fevers, chills, diaphoresis, left-sided chest pain, abdominal pain, dysuria, diarrhea.  He states that he has not been taking any medications for the last 2 months.  He has missed several follow-up appointments with his PCP earlier this year for management of his uncontrolled hypertension.  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  He says this involves moderately heavy weight lifting as well as repetitive motions involving his upper extremities.  He denies any recent injury or fall.  He is a current smoker of about half a pack per day.  Reports drinking maybe 1 beer daily.  Denies any illicit drug use.  ED Course  Labs/Imaging on admission: I have personally reviewed following labs and imaging studies.  Initial vitals showed BP 247/162, pulse 74, RR 18, temp 97.8 F, SPO2 98% on room air.  Labs show sodium 141, potassium 3.8, bicarb 21, BUN 18, creatinine 1.13, serum glucose 89, WBC 5.2, hemoglobin 15.9, platelets 325,000, troponin 23 > 24, lipase 34, LFTs within normal limits.  Urinalysis shows 30 protein otherwise negative.  Portable chest x-ray negative for focal consolidation, edema, effusion.  A loop recorder seen over the left chest.  CT chest/abdomen/pelvis with contrast shows mild bilateral lower lobe  bronchial thickening with adjacent groundglass attenuation concerning for bronchopneumonia.  No CT evidence of acute abdominal/pelvic process.  Patient was given amlodipine 10 mg, clonidine 0.2 mg, IV hydralazine 10 mg, IV labetalol 20 mg, IV ceftriaxone and azithromycin.  The hospitalist service was consulted to admit for further evaluation and management.  Review of Systems: All systems reviewed and are negative except as documented in history of present illness above.   Past Medical History:  Diagnosis Date   Foot injury 12/07/2018   Hyperlipidemia    Hypertension    Stroke [facility]) 2017   R side weakness deficits    Past Surgical History:  Procedure Laterality Date   LOOP RECORDER INSERTION N/A 03/17/2018   Procedure: LOOP RECORDER INSERTION;  Surgeon: Regan Lemming, MD;  Location: [facility] INVASIVE CV LAB;  Service: Cardiovascular;  Laterality: N/A;   TEE WITHOUT CARDIOVERSION N/A 03/17/2018   Procedure: TRANSESOPHAGEAL ECHOCARDIOGRAM (TEE);  Surgeon: Wendall Stade, MD;  Location: [facility] ENDOSCOPY;  Service: Cardiovascular;  Laterality: N/A;    Social History:  reports that he has been smoking cigarettes. He has been smoking an average of .25 packs per day. He has never used smokeless tobacco. He reports current alcohol use of about 7.0 standard drinks of alcohol per week. He reports that he does not use drugs.  Allergies  Allergen Reactions   Benazepril Swelling   Aleve [Naproxen Sodium] Hypertension   Shellfish-Derived Products Swelling and Other (See Comments)    Must take with Benadryl to tolerate- Lips and the tongue swell    Family History  Problem Relation Age of Onset   Heart attack Mother  Stroke Mother    Stroke Brother 1   Stroke Other      Prior to Admission medications   Medication Sig Start Date End Date Taking? Authorizing Provider  acetaminophen (TYLENOL) 325 MG tablet Take 650 mg by mouth every 6 (six) hours as needed for mild pain or headache.    Yes  [provider]  amLODipine (NORVASC) 10 MG tablet TAKE 1 TABLET(10 MG) BY MOUTH DAILY Patient taking differently: Take 10 mg by mouth daily. 09/03/21  Yes Littie Deeds, MD  aspirin 81 MG chewable tablet Chew 1 tablet (81 mg total) by mouth daily. 04/02/21  Yes Littie Deeds, MD  atorvastatin (LIPITOR) 80 MG tablet Take 1 tablet (80 mg total) by mouth daily. 06/19/21  Yes Littie Deeds, MD  gabapentin (NEURONTIN) 300 MG capsule Take 300 mg by mouth 2 (two) times daily as needed (for pain).  12/19/18  Yes [provider]  hydrochlorothiazide (HYDRODIURIL) 25 MG tablet Take 1 tablet (25 mg total) by mouth daily. 05/27/21  Yes Littie Deeds, MD  nicotine (NICODERM CQ - DOSED IN MG/24 HR) 7 mg/24hr patch Place 1 patch (7 mg total) onto the skin daily. 05/27/21  Yes Littie Deeds, MD  nicotine polacrilex (COMMIT) 2 MG lozenge TAKE 1 LOZENGE BY MOUTH AS NEEDED FOR SMOKING CESSATION 06/22/21  Yes Littie Deeds, MD  amLODipine-benazepril (LOTREL) 10-20 MG capsule Take 1 capsule by mouth daily. 02/06/19 07/11/19  Sandre Kitty, MD    Physical Exam: Vitals:   12/05/21 1730 12/05/21 1815 12/05/21 1850 12/05/21 1930  BP: (!) 176/100 (!) 178/113 (!) 192/107 (!) 184/125  Pulse: 65 (!) 56 64 72  Resp: (!) 24 20 (!) 27 (!) 37  Temp:   98.6 F (37 C)   TempSrc:   Oral   SpO2: 97% 99% 97% 99%  Weight:      Height:       Constitutional: Resting supine in bed, NAD, calm, comfortable Eyes: EOMI, lids and conjunctivae normal ENMT: Mucous membranes are moist. Posterior pharynx clear of any exudate or lesions.Normal dentition.  Neck: normal, supple, no masses. Respiratory: clear to auscultation bilaterally, no wheezing, no crackles. Normal respiratory effort. No accessory muscle use.  Cardiovascular: Regular rate and rhythm, no murmurs / rubs / gallops. No extremity edema. 2+ pedal pulses. Abdomen: no tenderness, no masses palpated. Bowel sounds positive.  Musculoskeletal: Point tenderness to right lower  lateral ribs.  No clubbing / cyanosis. No joint deformity upper and lower extremities. Good ROM, no contractures. Normal muscle tone.  Skin: no rashes, lesions, ulcers. No induration Neurologic: Sensation intact. Strength 5/5 in all 4.  Psychiatric: Alert and oriented x 3. Normal mood.   EKG: Personally reviewed. Sinus rhythm, rate 83, biatrial enlargement, LVH, motion artifact.  Rate is faster when compared to prior.  Assessment/Plan Principal Problem:   Bronchopneumonia Active Problems:   Uncontrolled hypertension   Right-sided chest wall pain   History of CVA (cerebrovascular accident)   Hyperlipidemia LDL goal <70   Tobacco use   Eric Patterson is a 56 y.o. Patterson with medical history significant for history of CVA, uncontrolled hypertension, hyperlipidemia, tobacco use who is admitted with bronchopneumonia and uncontrolled hypertension.  Assessment and Plan: * Bronchopneumonia CT chest with changes concerning for early bronchopneumonia.  Might be contributing some to his dyspnea/pleuritic chest discomfort although does have significant reproducible right-sided chest wall musculoskeletal pain.  Saturating well on room air. -Continue ceftriaxone and azithromycin for now -Supplemental oxygen as needed  Uncontrolled hypertension Chronically uncontrolled  hypertension in the setting of medication nonadherence and missed appointments with PCP.  Has reported history of angioedema with benazepril. -Restart amlodipine 10 mg daily -Restart HCTZ 25 mg daily -Add clonidine 0.1 mg BID -IV hydralazine as needed -Needs to keep follow-up appointments with PCP  Right-sided chest wall pain Reproducible right lateral chest wall pain on admission. -Obtain x-ray of right ribs  History of CVA (cerebrovascular accident) Restart aspirin 81 mg daily and atorvastatin 80 mg daily.  Tobacco use Smoking half a pack per day.  Nicotine patch ordered.  Hyperlipidemia LDL goal <70 Continue  atorvastatin.  DVT prophylaxis: enoxaparin (LOVENOX) injection 40 mg Start: 12/05/21 2200 Code Status: Full code, confirmed with patient on admission Family Communication: Discussed with patient spouse at bedside Disposition Plan: From home and likely discharge to home in 1-2 days Consults called: None Severity of Illness: The appropriate patient status for this patient is OBSERVATION. Observation status is judged to be reasonable and necessary in order to provide the required intensity of service to ensure the patient's safety. The patient's presenting symptoms, physical exam findings, and initial radiographic and laboratory data in the context of their medical condition is felt to place them at decreased risk for further clinical deterioration. Furthermore, it is anticipated that the patient will be medically stable for discharge from the hospital within 2 midnights of admission.   Darreld Mclean MD [facility]  If 7PM-7AM, please contact night-coverage [email or web address]  12/05/2021, 8:17 PM

## 2021-12-05 NOTE — Assessment & Plan Note (Signed)
Continue atorvastatin

## 2021-12-06 DIAGNOSIS — I160 Hypertensive urgency: Secondary | ICD-10-CM

## 2021-12-06 LAB — BASIC METABOLIC PANEL
Anion gap: 7 (ref 5–15)
BUN: 19 mg/dL (ref 6–20)
CO2: 24 mmol/L (ref 22–32)
Calcium: 8.7 mg/dL — ABNORMAL LOW (ref 8.9–10.3)
Chloride: 107 mmol/L (ref 98–111)
Creatinine, Ser: 1.22 mg/dL (ref 0.61–1.24)
GFR, Estimated: >60 mL/min (ref >60)
Glucose, Bld: 105 mg/dL — ABNORMAL HIGH (ref 70–99)
Potassium: 3.5 mmol/L (ref 3.5–5.1)
Sodium: 138 mmol/L (ref 135–145)

## 2021-12-06 LAB — CBC
HCT: 43.7 % (ref 39.0–52.0)
Hemoglobin: 14.9 g/dL (ref 13.0–17.0)
MCH: 31 pg (ref 26.0–34.0)
MCHC: 34.1 g/dL (ref 30.0–36.0)
MCV: 91 fL (ref 80.0–100.0)
Platelets: 297 10*3/uL (ref 150–400)
RBC: 4.8 MIL/uL (ref 4.22–5.81)
RDW: 13.2 % (ref 11.5–15.5)
WBC: 6.3 10*3/uL (ref 4.0–10.5)
nRBC: 0 % (ref 0.0–0.2)

## 2021-12-06 LAB — PROCALCITONIN: Procalcitonin: <0.1 ng/mL

## 2021-12-06 LAB — HIV ANTIBODY (ROUTINE TESTING W REFLEX): HIV Screen 4th Generation wRfx: NONREACTIVE

## 2021-12-06 MED ORDER — AMOXICILLIN-POT CLAVULANATE 875-125 MG PO TABS: 1.0000 | ORAL_TABLET | Freq: Two times a day (BID) | ORAL | 0 refills | Status: AC

## 2021-12-06 MED ORDER — ATORVASTATIN CALCIUM 80 MG PO TABS: 80.0000 mg | ORAL_TABLET | Freq: Every day | ORAL | 3 refills | Status: AC

## 2021-12-06 MED ORDER — CLONIDINE HCL 0.1 MG PO TABS: 0.1000 mg | ORAL_TABLET | Freq: Two times a day (BID) | ORAL | 0 refills | Status: AC

## 2021-12-06 MED ORDER — AMLODIPINE BESYLATE 10 MG PO TABS: 10.0000 mg | ORAL_TABLET | Freq: Every day | ORAL | 0 refills | Status: AC

## 2021-12-06 MED ORDER — ORAL CARE MOUTH RINSE
15.0000 mL | OROMUCOSAL | Status: DC | PRN
Start: 2021-12-06 — End: 2021-12-06

## 2021-12-06 MED ORDER — HYDROCHLOROTHIAZIDE 25 MG PO TABS: 25.0000 mg | ORAL_TABLET | Freq: Every day | ORAL | 0 refills | Status: AC

## 2021-12-06 MED ORDER — ASPIRIN 81 MG PO CHEW: 81.0000 mg | CHEWABLE_TABLET | Freq: Every day | ORAL | 3 refills | Status: AC

## 2021-12-06 NOTE — Plan of Care (Signed)
Discharge instructions reviewed with patient, questions answered, verbalized understanding.  Patient transported via wheelchair to emergency entrance to be taken home by wife.

## 2021-12-06 NOTE — Plan of Care (Signed)

## 2021-12-06 NOTE — Discharge Summary (Signed)
PatientPhysician Discharge Summary  Eric Patterson HEN:[id number] DOB: 04/04/1966 DOA: 12/05/2021  PCP: Littie Deeds, MD  Admit date: 12/05/2021 Discharge date: 12/06/2021    Admitted From: Home Disposition: Home  Recommendations for Outpatient Follow-up:  Follow up with PCP in 1-2 weeks Please obtain BMP/CBC in one week Please follow up with your PCP on the following pending results: Unresulted Labs (From admission, onward)     Start     Ordered   12/05/21 1854  Rapid urine drug screen (hospital performed)  Add-on,   AD        12/05/21 1853              Home Health: None Equipment/Devices: None  Discharge Condition: Stable CODE STATUS: Full code Diet recommendation: Cardiac  Subjective: Seen and examined.  Wife at the bedside.  Patient feels much better.  No more chest pain or shortness of breath or any cough.  He is ready to go home.  Following HPI and ED course is copied from my admitting hospitalist colleague Dr. Eliane Decree H&P. HPI: Eric Patterson is a 56 y.o. male with medical history significant for history of CVA, uncontrolled hypertension, hyperlipidemia, tobacco use who presented to the ED for evaluation of shortness of breath.  Patient states he woke up this morning with right-sided chest wall pain, shortness of breath, worsening pain with deep inspiration.  He denies cough, nausea, vomiting, subjective fevers, chills, diaphoresis, left-sided chest pain, abdominal pain, dysuria, diarrhea.  He states that he has not been taking any medications for the last 2 months.  He has missed several follow-up appointments with his PCP earlier this year for management of his uncontrolled hypertension.  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  He says this involves moderately heavy weight lifting as well as repetitive motions involving his upper extremities.  He denies any recent injury or fall.   He is a current smoker of about half a pack per  day.  Reports drinking maybe 1 beer daily.  Denies any illicit drug use.   ED Course  Labs/Imaging on admission: I have personally reviewed following labs and imaging studies.   Initial vitals showed BP 247/162, pulse 74, RR 18, temp 97.8 F, SPO2 98% on room air.   Labs show sodium 141, potassium 3.8, bicarb 21, BUN 18, creatinine 1.13, serum glucose 89, WBC 5.2, hemoglobin 15.9, platelets 325,000, troponin 23 > 24, lipase 34, LFTs within normal limits.  Urinalysis shows 30 protein otherwise negative.   Portable chest x-ray negative for focal consolidation, edema, effusion.  A loop recorder seen over the left chest.   CT chest/abdomen/pelvis with contrast shows mild bilateral lower lobe bronchial thickening with adjacent groundglass attenuation concerning for bronchopneumonia.  No CT evidence of acute abdominal/pelvic process.   Patient was given amlodipine 10 mg, clonidine 0.2 mg, IV hydralazine 10 mg, IV labetalol 20 mg, IV ceftriaxone and azithromycin.  The hospitalist service was consulted to admit for further evaluation and management.    Brief/Interim Summary: Detailed hospitalization as below. Bronchopneumonia CT chest with changes concerning for early bronchopneumonia.  Might be contributing some to his dyspnea/pleuritic chest discomfort although does have significant reproducible right-sided chest wall musculoskeletal pain.  Saturating well on room air.  He was started on Rocephin and Zithromax.  Procalcitonin unremarkable.  He does have rhonchi on clinical examination.  We will discharge him on Augmentin for 5 more days.   Hypertensive urgency: Chronically uncontrolled hypertension in the setting of medication  nonadherence and missed appointments with PCP.  Has reported history of angioedema with benazepril.  Blood pressure upon arrival was 247/162.  Restarted following medications. - amlodipine 10 mg daily  HCTZ 25 mg daily -Added clonidine 0.1 mg BID Blood pressure much better.   Patient had ran out of his medications 2 to 3 months ago and did not refill or follow-up with PCP due to lack of insurance.  I prescribed him all those medications.   Right-sided chest wall pain Reproducible right lateral chest wall pain on admission.  No more chest pain now.  X-ray negative for rib fracture.   History of CVA (cerebrovascular accident) Restart aspirin 81 mg daily and atorvastatin 80 mg daily.   Tobacco use: Counseling provided for quitting.   Hyperlipidemia LDL goal <70 Continue atorvastatin.   Discharge plan was discussed with patient and/or family member and they verbalized understanding and agreed with it.  Discharge Diagnoses:  Principal Problem:   Bronchopneumonia Active Problems:   Uncontrolled hypertension   Right-sided chest wall pain   History of CVA (cerebrovascular accident)   Hyperlipidemia LDL goal <70   Tobacco use   Hypertensive urgency    Discharge Instructions   Allergies as of 12/06/2021       Reactions   Benazepril Swelling   Aleve [naproxen Sodium] Hypertension   Shellfish-derived Products Swelling, Other (See Comments)   Must take with Benadryl to tolerate- Lips and the tongue swell        Medication List     STOP taking these medications    gabapentin 300 MG capsule Commonly known as: NEURONTIN       TAKE these medications    acetaminophen 325 MG tablet Commonly known as: TYLENOL Take 650 mg by mouth every 6 (six) hours as needed for mild pain or headache.   amLODipine 10 MG tablet Commonly known as: NORVASC Take 1 tablet (10 mg total) by mouth daily. What changed: See the new instructions.   amoxicillin-clavulanate 875-125 MG tablet Commonly known as: AUGMENTIN Take 1 tablet by mouth 2 (two) times daily for 5 days.   aspirin 81 MG chewable tablet Chew 1 tablet (81 mg total) by mouth daily.   atorvastatin 80 MG tablet Commonly known as: LIPITOR Take 1 tablet (80 mg total) by mouth daily.   cloNIDine 0.1 MG  tablet Commonly known as: CATAPRES Take 1 tablet (0.1 mg total) by mouth 2 (two) times daily.   hydrochlorothiazide 25 MG tablet Commonly known as: HYDRODIURIL Take 1 tablet (25 mg total) by mouth daily.   nicotine 7 mg/24hr patch Commonly known as: NICODERM CQ - dosed in mg/24 hr Place 1 patch (7 mg total) onto the skin daily.   nicotine polacrilex 2 MG lozenge Commonly known as: COMMIT TAKE 1 LOZENGE BY MOUTH AS NEEDED FOR SMOKING CESSATION        Follow-up Information     Littie Deeds, MD Follow up in 1 week(s).   Specialty: Family Medicine Contact information: [address] [place] [address] [phone]                Allergies  Allergen Reactions   Benazepril Swelling   Aleve [Naproxen Sodium] Hypertension   Shellfish-Derived Products Swelling and Other (See Comments)    Must take with Benadryl to tolerate- Lips and the tongue swell    Consultations: None   Procedures/Studies: DG Ribs Unilateral Right  Result Date: 12/05/2021 CLINICAL DATA:  Shortness of breath, severe anterior right rib pain. EXAM: RIGHT RIBS -  2 VIEW COMPARISON:  12/05/2021. FINDINGS: No fracture or other bone lesions are seen involving the ribs. A loop recorder device is present over the left chest. IMPRESSION: No evidence of rib fracture. Electronically Signed   By: Thornell Sartorius M.D.   On: 12/05/2021 20:11   CT CHEST ABDOMEN PELVIS W CONTRAST  Result Date: 12/05/2021 CLINICAL DATA:  Shortness of breath, chest pain.  Hypertension. EXAM: CT CHEST, ABDOMEN, AND PELVIS WITH CONTRAST TECHNIQUE: Multidetector CT imaging of the chest, abdomen and pelvis was performed following the standard protocol during bolus administration of intravenous contrast. RADIATION DOSE REDUCTION: This exam was performed according to the departmental dose-optimization program which includes automated exposure control, adjustment of the mA and/or kV according to patient size and/or use of iterative  reconstruction technique. CONTRAST:  OMNIPAQUE IOHEXOL 300 MG/ML  SOLN COMPARISON:  None Available. FINDINGS: CT CHEST FINDINGS Cardiovascular: No significant vascular findings. Normal heart size. No pericardial effusion. Mild coronary artery atherosclerotic calcifications. Mild atherosclerotic calcification of the aortic arch. Mediastinum/Nodes: No enlarged mediastinal, hilar, or axillary lymph nodes. Thyroid gland, trachea, and esophagus demonstrate no significant findings. Lungs/Pleura: Mild centrilobular emphysematous changes. Mild bilateral lower lobe bronchial thickening with adjacent ground-glass attenuation concerning for infectious/inflammatory process. No pleural effusion or focal consolidation. Musculoskeletal: No chest wall mass or suspicious bone lesions identified. CT ABDOMEN PELVIS FINDINGS Hepatobiliary: No focal liver abnormality is seen. No gallstones, gallbladder wall thickening, or biliary dilatation. Pancreas: Unremarkable. No pancreatic ductal dilatation or surrounding inflammatory changes. Spleen: Normal in size without focal abnormality. Adrenals/Urinary Tract: Adrenal glands are unremarkable. Punctate nonobstructing calculus in the upper pole of the right kidney. No evidence of nephrolithiasis or hydronephrosis. Bladder is unremarkable. Stomach/Bowel: Stomach is within normal limits. Appendix appears normal. No evidence of bowel wall thickening, distention, or inflammatory changes. Vascular/Lymphatic: No significant vascular findings are present. Mild atherosclerotic calcification of infrarenal abdominal aorta and branch vessels. Aorta is however normal in caliber. No evidence of aortic aneurysm. No enlarged abdominal or pelvic lymph nodes. Reproductive: Prostate is unremarkable. Other: No abdominal wall hernia or abnormality. No abdominopelvic ascites. Musculoskeletal: No acute or significant osseous findings. IMPRESSION: 1. Mild bilateral lower lobe bronchial thickening with adjacent  ground-glass attenuation concerning for bronchopneumonia, clinical correlation is suggested. 2. Aorta and branch vessels are normal in caliber. Mild atherosclerotic disease. No evidence of aortic aneurysm or acute vascular abnormality. 3.  No CT evidence of acute abdominal/pelvic process. Electronically Signed   By: Larose Hires D.O.   On: 12/05/2021 18:16   DG Chest Portable 1 View  Result Date: 12/05/2021 CLINICAL DATA:  Shortness of breath this morning with pleuritic pain over the right anterior ribs. EXAM: PORTABLE CHEST 1 VIEW COMPARISON:  None Available. FINDINGS: Lungs are adequately inflated without focal airspace consolidation or effusion. Mild cardiomegaly. Loop recorder projects over the left heart. Remainder the exam is unremarkable. IMPRESSION: 1. No acute cardiopulmonary disease. 2. Mild cardiomegaly. Electronically Signed   By: Elberta Fortis M.D.   On: 12/05/2021 15:04     Discharge Exam: Vitals:   12/06/21 0750 12/06/21 0839  BP:  (!) 174/99  Pulse:  (!) 54  Resp:  20  Temp:  97.6 F (36.4 C)  SpO2: 100% 100%   Vitals:   12/06/21 0044 12/06/21 0435 12/06/21 0750 12/06/21 0839  BP: 138/79 (!) 151/86  (!) 174/99  Pulse: 69 65  (!) 54  Resp: 17 18  20   Temp: 98.2 F (36.8 C) 98.4 F (36.9 C)  97.6 F (36.4 C)  TempSrc: Oral Oral  Oral  SpO2: 98% 100% 100% 100%  Weight:  78.1 kg    Height:  5\' 6"  (1.676 m)      General: Pt is alert, awake, not in acute distress Cardiovascular: RRR, S1/S2 +, no rubs, no gallops Respiratory: Scattered rhonchi.  No wheezes. Abdominal: Soft, NT, ND, bowel sounds + Extremities: no edema, no cyanosis    The results of significant diagnostics from this hospitalization (including imaging, microbiology, ancillary and laboratory) are listed below for reference.     Microbiology: No results found for this or any previous visit (from the past 240 hour(s)).   Labs: BNP (last 3 results) No results for input(s): "BNP" in the last 8760  hours. Basic Metabolic Panel: Recent Labs  Lab 12/05/21 1438 12/06/21 0404  NA 141 138  K 3.8 3.5  CL 109 107  CO2 21* 24  GLUCOSE 89 105*  BUN 18 19  CREATININE 1.13 1.22  CALCIUM 9.1 8.7*   Liver Function Tests: Recent Labs  Lab 12/05/21 1543  AST 22  ALT 23  ALKPHOS 88  BILITOT 0.8  PROT 8.0  ALBUMIN 4.0   Recent Labs  Lab 12/05/21 1543  LIPASE 34   No results for input(s): "AMMONIA" in the last 168 hours. CBC: Recent Labs  Lab 12/05/21 1438 12/06/21 0404  WBC 5.2 6.3  HGB 15.9 14.9  HCT 46.9 43.7  MCV 91.8 91.0  PLT 325 297   Cardiac Enzymes: No results for input(s): "CKTOTAL", "CKMB", "CKMBINDEX", "TROPONINI" in the last 168 hours. BNP: Invalid input(s): "POCBNP" CBG: No results for input(s): "GLUCAP" in the last 168 hours. D-Dimer No results for input(s): "DDIMER" in the last 72 hours. Hgb A1c No results for input(s): "HGBA1C" in the last 72 hours. Lipid Profile No results for input(s): "CHOL", "HDL", "LDLCALC", "TRIG", "CHOLHDL", "LDLDIRECT" in the last 72 hours. Thyroid function studies No results for input(s): "TSH", "T4TOTAL", "T3FREE", "THYROIDAB" in the last 72 hours.  Invalid input(s): "FREET3" Anemia work up No results for input(s): "VITAMINB12", "FOLATE", "FERRITIN", "TIBC", "IRON", "RETICCTPCT" in the last 72 hours. Urinalysis    Component Value Date/Time   COLORURINE YELLOW 12/05/2021 1514   APPEARANCEUR CLEAR 12/05/2021 1514   LABSPEC 1.028 12/05/2021 1514   PHURINE 5.0 12/05/2021 1514   GLUCOSEU NEGATIVE 12/05/2021 1514   HGBUR NEGATIVE 12/05/2021 1514   BILIRUBINUR NEGATIVE 12/05/2021 1514   KETONESUR NEGATIVE 12/05/2021 1514   PROTEINUR 30 (A) 12/05/2021 1514   UROBILINOGEN 1.0 02/10/2015 2105   NITRITE NEGATIVE 12/05/2021 1514   LEUKOCYTESUR NEGATIVE 12/05/2021 1514   Sepsis Labs Recent Labs  Lab 12/05/21 1438 12/06/21 0404  WBC 5.2 6.3   Microbiology No results found for this or any previous visit (from the  past 240 hour(s)).   Time coordinating discharge: Over 30 minutes  SIGNED:   12/08/21, MD  [facility] 12/06/2021, 10:37 AM *Please note that this is a verbal dictation therefore any spelling or grammatical errors are due to the "[employer]agon [employer] One" system interpretation. If 7PM-7AM, please contact night-coverage [email or web address]

## 2021-12-06 NOTE — TOC Transition Note (Signed)
Transition of Care [facility]) - CM/SW Discharge Note   Patient Details  Name: Eric Patterson MRN: [id number] Date of Birth: [birth year]  [facility] CM/SW Contact:  Darleene Cleaver, LCSW Phone Number: 12/06/2021, 11:04 AM   Clinical Narrative:     CSW received consult that patient needs medication assistance.  This CSW reviewed patient's information, he does not have any insurance.  CSW printed Good Rx coupons for his medications that he is discharging on and provided secretary with coupons for patient to discharge home on.  CSW signing off, please reconsult if other social work needs arise.  Final next level of care: Home/Self Care Barriers to Discharge: Barriers Resolved   Patient Goals and CMS Choice Patient states their goals for this hospitalization and ongoing recovery are:: To return back home.      Discharge Placement                       Discharge Plan and Services                                     Social Determinants of Health (SDOH) Interventions     Readmission Risk Interventions     No data to display
